# Patient Record
Sex: Female | Born: 1955 | Race: Black or African American | Hispanic: No | Marital: Single | Smoking: Former smoker
Health system: Southern US, Community
[De-identification: ages and names within clinical notes are randomized; demographics above are authoritative.]

## PROBLEM LIST (undated history)

## (undated) DIAGNOSIS — F329 Major depressive disorder, single episode, unspecified: Secondary | ICD-10-CM

## (undated) DIAGNOSIS — I10 Essential (primary) hypertension: Secondary | ICD-10-CM

## (undated) DIAGNOSIS — F32A Depression, unspecified: Secondary | ICD-10-CM

## (undated) DIAGNOSIS — C801 Malignant (primary) neoplasm, unspecified: Secondary | ICD-10-CM

## (undated) DIAGNOSIS — Z21 Asymptomatic human immunodeficiency virus [HIV] infection status: Secondary | ICD-10-CM

## (undated) DIAGNOSIS — B20 Human immunodeficiency virus [HIV] disease: Secondary | ICD-10-CM

## (undated) DIAGNOSIS — G43909 Migraine, unspecified, not intractable, without status migrainosus: Secondary | ICD-10-CM

## (undated) DIAGNOSIS — F209 Schizophrenia, unspecified: Secondary | ICD-10-CM

## (undated) HISTORY — PX: THYROID SURGERY: SHX805

## (undated) HISTORY — DX: Migraine, unspecified, not intractable, without status migrainosus: G43.909

## (undated) HISTORY — PX: ABDOMINAL HYSTERECTOMY: SHX81

## (undated) HISTORY — DX: Schizophrenia, unspecified: F20.9

## (undated) HISTORY — PX: BREAST BIOPSY: SHX20

---

## 1998-06-08 ENCOUNTER — Other Ambulatory Visit: Admission: RE | Admit: 1998-06-08 | Discharge: 1998-06-08 | Payer: Self-pay | Admitting: Family Medicine

## 2000-09-30 ENCOUNTER — Encounter: Payer: Self-pay | Admitting: Family Medicine

## 2000-09-30 ENCOUNTER — Ambulatory Visit (HOSPITAL_COMMUNITY): Admission: RE | Admit: 2000-09-30 | Discharge: 2000-09-30 | Payer: Self-pay | Admitting: Family Medicine

## 2001-04-28 ENCOUNTER — Ambulatory Visit (HOSPITAL_COMMUNITY): Admission: RE | Admit: 2001-04-28 | Discharge: 2001-04-28 | Payer: Self-pay | Admitting: Family Medicine

## 2001-07-24 ENCOUNTER — Emergency Department (HOSPITAL_COMMUNITY): Admission: EM | Admit: 2001-07-24 | Discharge: 2001-07-24 | Payer: Self-pay | Admitting: Emergency Medicine

## 2002-12-31 HISTORY — PX: FINGER SURGERY: SHX640

## 2002-12-31 HISTORY — PX: ANKLE SURGERY: SHX546

## 2003-06-02 ENCOUNTER — Ambulatory Visit (HOSPITAL_BASED_OUTPATIENT_CLINIC_OR_DEPARTMENT_OTHER): Admission: RE | Admit: 2003-06-02 | Discharge: 2003-06-02 | Payer: Self-pay | Admitting: Orthopedic Surgery

## 2003-07-30 ENCOUNTER — Encounter: Payer: Self-pay | Admitting: Occupational Therapy

## 2003-07-30 ENCOUNTER — Ambulatory Visit (HOSPITAL_COMMUNITY): Admission: RE | Admit: 2003-07-30 | Discharge: 2003-07-30 | Payer: Self-pay | Admitting: Family Medicine

## 2003-08-02 ENCOUNTER — Ambulatory Visit (HOSPITAL_COMMUNITY): Admission: RE | Admit: 2003-08-02 | Discharge: 2003-08-02 | Payer: Self-pay | Admitting: Family Medicine

## 2003-08-02 ENCOUNTER — Encounter: Payer: Self-pay | Admitting: Family Medicine

## 2003-08-17 ENCOUNTER — Ambulatory Visit (HOSPITAL_COMMUNITY): Admission: RE | Admit: 2003-08-17 | Discharge: 2003-08-17 | Payer: Self-pay | Admitting: Family Medicine

## 2003-08-17 ENCOUNTER — Encounter: Payer: Self-pay | Admitting: Family Medicine

## 2003-08-17 ENCOUNTER — Encounter (INDEPENDENT_AMBULATORY_CARE_PROVIDER_SITE_OTHER): Payer: Self-pay | Admitting: *Deleted

## 2004-06-22 ENCOUNTER — Ambulatory Visit (HOSPITAL_COMMUNITY): Admission: RE | Admit: 2004-06-22 | Discharge: 2004-06-22 | Payer: Self-pay | Admitting: Hematology and Oncology

## 2004-07-31 ENCOUNTER — Ambulatory Visit (HOSPITAL_COMMUNITY): Admission: RE | Admit: 2004-07-31 | Discharge: 2004-07-31 | Payer: Self-pay | Admitting: Family Medicine

## 2004-09-25 ENCOUNTER — Ambulatory Visit: Payer: Self-pay | Admitting: *Deleted

## 2004-09-25 ENCOUNTER — Ambulatory Visit: Payer: Self-pay | Admitting: Family Medicine

## 2004-10-03 ENCOUNTER — Ambulatory Visit: Payer: Self-pay | Admitting: Family Medicine

## 2004-10-09 ENCOUNTER — Emergency Department (HOSPITAL_COMMUNITY): Admission: EM | Admit: 2004-10-09 | Discharge: 2004-10-09 | Payer: Self-pay | Admitting: Emergency Medicine

## 2004-10-17 ENCOUNTER — Ambulatory Visit: Payer: Self-pay | Admitting: Family Medicine

## 2004-12-20 ENCOUNTER — Ambulatory Visit: Payer: Self-pay | Admitting: Family Medicine

## 2005-03-08 ENCOUNTER — Ambulatory Visit: Payer: Self-pay | Admitting: Family Medicine

## 2005-04-30 ENCOUNTER — Ambulatory Visit: Payer: Self-pay | Admitting: Family Medicine

## 2005-06-05 ENCOUNTER — Ambulatory Visit: Payer: Self-pay | Admitting: Family Medicine

## 2005-08-01 ENCOUNTER — Ambulatory Visit (HOSPITAL_COMMUNITY): Admission: RE | Admit: 2005-08-01 | Discharge: 2005-08-01 | Payer: Self-pay | Admitting: Family Medicine

## 2005-09-26 ENCOUNTER — Ambulatory Visit: Payer: Self-pay | Admitting: Family Medicine

## 2005-11-12 ENCOUNTER — Ambulatory Visit: Payer: Self-pay | Admitting: Family Medicine

## 2006-02-12 ENCOUNTER — Ambulatory Visit: Payer: Self-pay | Admitting: Family Medicine

## 2006-03-25 ENCOUNTER — Ambulatory Visit: Payer: Self-pay | Admitting: Family Medicine

## 2006-06-17 ENCOUNTER — Ambulatory Visit: Payer: Self-pay | Admitting: Family Medicine

## 2006-09-04 ENCOUNTER — Emergency Department (HOSPITAL_COMMUNITY): Admission: EM | Admit: 2006-09-04 | Discharge: 2006-09-04 | Payer: Self-pay | Admitting: Emergency Medicine

## 2006-09-18 ENCOUNTER — Ambulatory Visit: Payer: Self-pay | Admitting: Family Medicine

## 2006-10-25 ENCOUNTER — Emergency Department (HOSPITAL_COMMUNITY): Admission: EM | Admit: 2006-10-25 | Discharge: 2006-10-25 | Payer: Self-pay | Admitting: Emergency Medicine

## 2006-11-07 ENCOUNTER — Ambulatory Visit: Payer: Self-pay | Admitting: Family Medicine

## 2006-11-07 ENCOUNTER — Emergency Department (HOSPITAL_COMMUNITY): Admission: EM | Admit: 2006-11-07 | Discharge: 2006-11-07 | Payer: Self-pay | Admitting: Emergency Medicine

## 2006-11-16 ENCOUNTER — Emergency Department (HOSPITAL_COMMUNITY): Admission: EM | Admit: 2006-11-16 | Discharge: 2006-11-16 | Payer: Self-pay | Admitting: Emergency Medicine

## 2006-11-19 ENCOUNTER — Emergency Department (HOSPITAL_COMMUNITY): Admission: EM | Admit: 2006-11-19 | Discharge: 2006-11-19 | Payer: Self-pay | Admitting: Emergency Medicine

## 2006-11-28 ENCOUNTER — Ambulatory Visit: Payer: Self-pay | Admitting: Family Medicine

## 2006-12-11 ENCOUNTER — Ambulatory Visit: Payer: Self-pay | Admitting: Family Medicine

## 2006-12-30 ENCOUNTER — Ambulatory Visit: Payer: Self-pay | Admitting: Family Medicine

## 2007-01-20 ENCOUNTER — Ambulatory Visit: Payer: Self-pay | Admitting: Family Medicine

## 2007-03-18 ENCOUNTER — Ambulatory Visit (HOSPITAL_COMMUNITY): Admission: RE | Admit: 2007-03-18 | Discharge: 2007-03-18 | Payer: Self-pay | Admitting: Obstetrics and Gynecology

## 2007-04-21 ENCOUNTER — Ambulatory Visit: Payer: Self-pay | Admitting: Family Medicine

## 2007-05-31 ENCOUNTER — Emergency Department (HOSPITAL_COMMUNITY): Admission: EM | Admit: 2007-05-31 | Discharge: 2007-05-31 | Payer: Self-pay | Admitting: Emergency Medicine

## 2007-06-07 ENCOUNTER — Emergency Department (HOSPITAL_COMMUNITY): Admission: EM | Admit: 2007-06-07 | Discharge: 2007-06-07 | Payer: Self-pay | Admitting: Emergency Medicine

## 2007-06-20 ENCOUNTER — Encounter (INDEPENDENT_AMBULATORY_CARE_PROVIDER_SITE_OTHER): Payer: Self-pay | Admitting: Family Medicine

## 2007-06-23 ENCOUNTER — Emergency Department (HOSPITAL_COMMUNITY): Admission: EM | Admit: 2007-06-23 | Discharge: 2007-06-23 | Payer: Self-pay | Admitting: Emergency Medicine

## 2007-06-27 ENCOUNTER — Emergency Department (HOSPITAL_COMMUNITY): Admission: EM | Admit: 2007-06-27 | Discharge: 2007-06-27 | Payer: Self-pay | Admitting: Emergency Medicine

## 2007-07-16 ENCOUNTER — Emergency Department (HOSPITAL_COMMUNITY): Admission: EM | Admit: 2007-07-16 | Discharge: 2007-07-16 | Payer: Self-pay | Admitting: Emergency Medicine

## 2007-08-05 ENCOUNTER — Ambulatory Visit: Payer: Self-pay | Admitting: Family Medicine

## 2007-08-29 DIAGNOSIS — H519 Unspecified disorder of binocular movement: Secondary | ICD-10-CM | POA: Insufficient documentation

## 2007-08-29 DIAGNOSIS — E0590 Thyrotoxicosis, unspecified without thyrotoxic crisis or storm: Secondary | ICD-10-CM | POA: Insufficient documentation

## 2007-08-29 DIAGNOSIS — F329 Major depressive disorder, single episode, unspecified: Secondary | ICD-10-CM

## 2007-08-29 DIAGNOSIS — E042 Nontoxic multinodular goiter: Secondary | ICD-10-CM

## 2007-08-29 DIAGNOSIS — R443 Hallucinations, unspecified: Secondary | ICD-10-CM

## 2007-08-29 DIAGNOSIS — G43909 Migraine, unspecified, not intractable, without status migrainosus: Secondary | ICD-10-CM | POA: Insufficient documentation

## 2007-09-09 ENCOUNTER — Ambulatory Visit: Payer: Self-pay | Admitting: Family Medicine

## 2007-09-17 ENCOUNTER — Encounter (INDEPENDENT_AMBULATORY_CARE_PROVIDER_SITE_OTHER): Payer: Self-pay | Admitting: *Deleted

## 2007-10-09 ENCOUNTER — Encounter (INDEPENDENT_AMBULATORY_CARE_PROVIDER_SITE_OTHER): Payer: Self-pay | Admitting: Family Medicine

## 2007-10-20 ENCOUNTER — Telehealth (INDEPENDENT_AMBULATORY_CARE_PROVIDER_SITE_OTHER): Payer: Self-pay | Admitting: Family Medicine

## 2007-11-20 ENCOUNTER — Telehealth (INDEPENDENT_AMBULATORY_CARE_PROVIDER_SITE_OTHER): Payer: Self-pay | Admitting: Family Medicine

## 2007-12-03 ENCOUNTER — Ambulatory Visit: Payer: Self-pay | Admitting: Family Medicine

## 2007-12-03 ENCOUNTER — Encounter (INDEPENDENT_AMBULATORY_CARE_PROVIDER_SITE_OTHER): Payer: Self-pay | Admitting: Family Medicine

## 2007-12-03 LAB — CONVERTED CEMR LAB
Nitrite: NEGATIVE
Pap Smear: NORMAL
Specific Gravity, Urine: >=1.03
Urobilinogen, UA: NEGATIVE
pH: 6

## 2007-12-11 ENCOUNTER — Ambulatory Visit (HOSPITAL_COMMUNITY): Admission: RE | Admit: 2007-12-11 | Discharge: 2007-12-11 | Payer: Self-pay | Admitting: Ophthalmology

## 2007-12-11 HISTORY — PX: STRABISMUS SURGERY: SHX218

## 2007-12-16 ENCOUNTER — Telehealth (INDEPENDENT_AMBULATORY_CARE_PROVIDER_SITE_OTHER): Payer: Self-pay | Admitting: Family Medicine

## 2007-12-16 LAB — CONVERTED CEMR LAB
ALT: 55 units/L — ABNORMAL HIGH (ref 0–35)
Albumin: 4.2 g/dL (ref 3.5–5.2)
CO2: 23 meq/L (ref 19–32)
Calcium: 9 mg/dL (ref 8.4–10.5)
Cholesterol: 216 mg/dL — ABNORMAL HIGH (ref 0–200)
Creatinine, Ser: 0.66 mg/dL (ref 0.40–1.20)
Eosinophils Absolute: 0.2 10*3/uL (ref 0.2–0.7)
Glucose, Bld: 86 mg/dL (ref 70–99)
HDL: 66 mg/dL (ref >39)
Hemoglobin: 12.7 g/dL (ref 12.0–15.0)
LDL Cholesterol: 134 mg/dL — ABNORMAL HIGH (ref 0–99)
Lymphocytes Relative: 50 % — ABNORMAL HIGH (ref 12–46)
Lymphs Abs: 2.1 10*3/uL (ref 0.7–4.0)
Monocytes Absolute: 0.5 10*3/uL (ref 0.1–1.0)
Neutro Abs: 1.5 10*3/uL — ABNORMAL LOW (ref 1.7–7.7)
RBC: 4.21 M/uL (ref 3.87–5.11)
TSH: 0.281 microintl units/mL — ABNORMAL LOW (ref 0.350–5.50)
Triglycerides: 80 mg/dL (ref <150)
VLDL: 16 mg/dL (ref 0–40)
WBC: 4.3 10*3/uL (ref 4.0–10.5)

## 2007-12-18 ENCOUNTER — Telehealth (INDEPENDENT_AMBULATORY_CARE_PROVIDER_SITE_OTHER): Payer: Self-pay | Admitting: *Deleted

## 2008-01-29 ENCOUNTER — Ambulatory Visit: Payer: Self-pay | Admitting: Family Medicine

## 2008-01-29 DIAGNOSIS — R7989 Other specified abnormal findings of blood chemistry: Secondary | ICD-10-CM | POA: Insufficient documentation

## 2008-01-29 LAB — CONVERTED CEMR LAB
ALT: 24 units/L (ref 0–35)
Albumin: 4.3 g/dL (ref 3.5–5.2)

## 2008-02-23 ENCOUNTER — Ambulatory Visit: Payer: Self-pay | Admitting: Family Medicine

## 2008-03-15 ENCOUNTER — Ambulatory Visit (HOSPITAL_COMMUNITY): Admission: RE | Admit: 2008-03-15 | Discharge: 2008-03-15 | Payer: Self-pay | Admitting: Family Medicine

## 2008-03-23 ENCOUNTER — Ambulatory Visit: Payer: Self-pay | Admitting: Family Medicine

## 2008-03-23 DIAGNOSIS — IMO0002 Reserved for concepts with insufficient information to code with codable children: Secondary | ICD-10-CM

## 2008-03-23 DIAGNOSIS — M1710 Unilateral primary osteoarthritis, unspecified knee: Secondary | ICD-10-CM | POA: Insufficient documentation

## 2008-03-24 ENCOUNTER — Ambulatory Visit (HOSPITAL_COMMUNITY): Admission: RE | Admit: 2008-03-24 | Discharge: 2008-03-24 | Payer: Self-pay | Admitting: Family Medicine

## 2008-03-31 ENCOUNTER — Ambulatory Visit: Payer: Self-pay | Admitting: Family Medicine

## 2008-03-31 DIAGNOSIS — N760 Acute vaginitis: Secondary | ICD-10-CM | POA: Insufficient documentation

## 2008-05-04 ENCOUNTER — Telehealth (INDEPENDENT_AMBULATORY_CARE_PROVIDER_SITE_OTHER): Payer: Self-pay | Admitting: *Deleted

## 2008-05-05 ENCOUNTER — Telehealth (INDEPENDENT_AMBULATORY_CARE_PROVIDER_SITE_OTHER): Payer: Self-pay | Admitting: *Deleted

## 2008-06-02 ENCOUNTER — Ambulatory Visit: Payer: Self-pay | Admitting: Nurse Practitioner

## 2008-06-02 DIAGNOSIS — H9209 Otalgia, unspecified ear: Secondary | ICD-10-CM | POA: Insufficient documentation

## 2008-06-08 ENCOUNTER — Ambulatory Visit: Payer: Self-pay | Admitting: Family Medicine

## 2008-06-08 DIAGNOSIS — L299 Pruritus, unspecified: Secondary | ICD-10-CM | POA: Insufficient documentation

## 2008-06-29 ENCOUNTER — Telehealth (INDEPENDENT_AMBULATORY_CARE_PROVIDER_SITE_OTHER): Payer: Self-pay | Admitting: Family Medicine

## 2008-07-12 ENCOUNTER — Telehealth (INDEPENDENT_AMBULATORY_CARE_PROVIDER_SITE_OTHER): Payer: Self-pay | Admitting: Family Medicine

## 2008-08-11 ENCOUNTER — Ambulatory Visit: Payer: Self-pay | Admitting: Family Medicine

## 2008-08-11 LAB — CONVERTED CEMR LAB: FSH: 29.9 milliintl units/mL

## 2008-08-13 ENCOUNTER — Ambulatory Visit (HOSPITAL_COMMUNITY): Admission: RE | Admit: 2008-08-13 | Discharge: 2008-08-13 | Payer: Self-pay | Admitting: Family Medicine

## 2008-08-16 ENCOUNTER — Encounter (INDEPENDENT_AMBULATORY_CARE_PROVIDER_SITE_OTHER): Payer: Self-pay | Admitting: Family Medicine

## 2008-09-14 ENCOUNTER — Telehealth (INDEPENDENT_AMBULATORY_CARE_PROVIDER_SITE_OTHER): Payer: Self-pay | Admitting: Family Medicine

## 2008-09-16 ENCOUNTER — Telehealth (INDEPENDENT_AMBULATORY_CARE_PROVIDER_SITE_OTHER): Payer: Self-pay | Admitting: Family Medicine

## 2008-09-23 ENCOUNTER — Encounter (INDEPENDENT_AMBULATORY_CARE_PROVIDER_SITE_OTHER): Payer: Self-pay | Admitting: Family Medicine

## 2008-10-05 ENCOUNTER — Ambulatory Visit: Payer: Self-pay | Admitting: Family Medicine

## 2008-10-05 DIAGNOSIS — L600 Ingrowing nail: Secondary | ICD-10-CM

## 2008-10-15 ENCOUNTER — Encounter (INDEPENDENT_AMBULATORY_CARE_PROVIDER_SITE_OTHER): Payer: Self-pay | Admitting: Nurse Practitioner

## 2008-11-22 ENCOUNTER — Telehealth (INDEPENDENT_AMBULATORY_CARE_PROVIDER_SITE_OTHER): Payer: Self-pay | Admitting: Family Medicine

## 2008-12-06 ENCOUNTER — Telehealth (INDEPENDENT_AMBULATORY_CARE_PROVIDER_SITE_OTHER): Payer: Self-pay | Admitting: Family Medicine

## 2009-01-10 ENCOUNTER — Ambulatory Visit: Payer: Self-pay | Admitting: Family Medicine

## 2009-02-28 ENCOUNTER — Telehealth (INDEPENDENT_AMBULATORY_CARE_PROVIDER_SITE_OTHER): Payer: Self-pay | Admitting: Family Medicine

## 2009-04-07 ENCOUNTER — Ambulatory Visit (HOSPITAL_COMMUNITY): Admission: RE | Admit: 2009-04-07 | Discharge: 2009-04-07 | Payer: Self-pay | Admitting: Family Medicine

## 2009-05-04 ENCOUNTER — Ambulatory Visit: Payer: Self-pay | Admitting: Family Medicine

## 2009-06-17 ENCOUNTER — Telehealth (INDEPENDENT_AMBULATORY_CARE_PROVIDER_SITE_OTHER): Payer: Self-pay | Admitting: Family Medicine

## 2009-06-27 ENCOUNTER — Encounter (INDEPENDENT_AMBULATORY_CARE_PROVIDER_SITE_OTHER): Payer: Self-pay | Admitting: *Deleted

## 2009-07-11 ENCOUNTER — Ambulatory Visit: Payer: Self-pay | Admitting: Nurse Practitioner

## 2009-07-11 DIAGNOSIS — B0229 Other postherpetic nervous system involvement: Secondary | ICD-10-CM

## 2009-07-21 ENCOUNTER — Emergency Department (HOSPITAL_COMMUNITY): Admission: EM | Admit: 2009-07-21 | Discharge: 2009-07-21 | Payer: Self-pay | Admitting: Emergency Medicine

## 2009-07-28 ENCOUNTER — Emergency Department (HOSPITAL_COMMUNITY): Admission: EM | Admit: 2009-07-28 | Discharge: 2009-07-28 | Payer: Self-pay | Admitting: Emergency Medicine

## 2009-07-28 ENCOUNTER — Encounter (INDEPENDENT_AMBULATORY_CARE_PROVIDER_SITE_OTHER): Payer: Self-pay | Admitting: Nurse Practitioner

## 2009-08-04 ENCOUNTER — Ambulatory Visit: Payer: Self-pay | Admitting: Nurse Practitioner

## 2009-08-06 ENCOUNTER — Emergency Department (HOSPITAL_COMMUNITY): Admission: EM | Admit: 2009-08-06 | Discharge: 2009-08-06 | Payer: Self-pay | Admitting: Emergency Medicine

## 2009-08-06 ENCOUNTER — Encounter (INDEPENDENT_AMBULATORY_CARE_PROVIDER_SITE_OTHER): Payer: Self-pay | Admitting: Nurse Practitioner

## 2009-08-10 ENCOUNTER — Ambulatory Visit: Payer: Self-pay | Admitting: Physician Assistant

## 2009-08-10 DIAGNOSIS — N390 Urinary tract infection, site not specified: Secondary | ICD-10-CM | POA: Insufficient documentation

## 2009-08-10 DIAGNOSIS — R079 Chest pain, unspecified: Secondary | ICD-10-CM | POA: Insufficient documentation

## 2009-08-10 LAB — CONVERTED CEMR LAB: TSH: 0.331 microintl units/mL — ABNORMAL LOW (ref 0.350–4.500)

## 2009-08-11 ENCOUNTER — Emergency Department (HOSPITAL_COMMUNITY): Admission: EM | Admit: 2009-08-11 | Discharge: 2009-08-11 | Payer: Self-pay | Admitting: Emergency Medicine

## 2009-08-12 ENCOUNTER — Ambulatory Visit: Payer: Self-pay | Admitting: Nurse Practitioner

## 2009-08-12 LAB — CONVERTED CEMR LAB
Blood in Urine, dipstick: NEGATIVE
Glucose, Urine, Semiquant: NEGATIVE
Ketones, urine, test strip: NEGATIVE
Specific Gravity, Urine: >=1.03
WBC Urine, dipstick: NEGATIVE

## 2009-08-13 ENCOUNTER — Encounter: Payer: Self-pay | Admitting: Physician Assistant

## 2009-08-15 ENCOUNTER — Telehealth: Payer: Self-pay | Admitting: Physician Assistant

## 2009-08-26 ENCOUNTER — Emergency Department (HOSPITAL_COMMUNITY): Admission: EM | Admit: 2009-08-26 | Discharge: 2009-08-26 | Payer: Self-pay | Admitting: Emergency Medicine

## 2009-08-30 ENCOUNTER — Emergency Department (HOSPITAL_COMMUNITY): Admission: EM | Admit: 2009-08-30 | Discharge: 2009-08-30 | Payer: Self-pay | Admitting: Emergency Medicine

## 2009-09-08 ENCOUNTER — Emergency Department (HOSPITAL_COMMUNITY): Admission: EM | Admit: 2009-09-08 | Discharge: 2009-09-08 | Payer: Self-pay | Admitting: Emergency Medicine

## 2009-09-12 ENCOUNTER — Ambulatory Visit: Payer: Self-pay | Admitting: Nurse Practitioner

## 2009-09-12 DIAGNOSIS — M5412 Radiculopathy, cervical region: Secondary | ICD-10-CM | POA: Insufficient documentation

## 2009-09-15 ENCOUNTER — Encounter (INDEPENDENT_AMBULATORY_CARE_PROVIDER_SITE_OTHER): Payer: Self-pay | Admitting: Nurse Practitioner

## 2009-09-19 ENCOUNTER — Ambulatory Visit (HOSPITAL_COMMUNITY): Admission: RE | Admit: 2009-09-19 | Discharge: 2009-09-19 | Payer: Self-pay | Admitting: Internal Medicine

## 2009-09-23 ENCOUNTER — Inpatient Hospital Stay (HOSPITAL_COMMUNITY): Admission: EM | Admit: 2009-09-23 | Discharge: 2009-09-25 | Payer: Self-pay | Admitting: Emergency Medicine

## 2009-09-23 DIAGNOSIS — J189 Pneumonia, unspecified organism: Secondary | ICD-10-CM

## 2009-09-23 LAB — CONVERTED CEMR LAB
AST: 32 units/L
Albumin: 3.2 g/dL
Alkaline Phosphatase: 110 units/L
BUN: 17 mg/dL
Calcium: 8.5 mg/dL
Hemoglobin: 12.8 g/dL
MCHC: 33.5 g/dL
Potassium: 3.7 meq/L
RDW: 13.3 %
Sodium: 135 meq/L
Total Bilirubin: 0.9 mg/dL

## 2009-10-07 ENCOUNTER — Ambulatory Visit: Payer: Self-pay | Admitting: Nurse Practitioner

## 2009-10-07 DIAGNOSIS — E041 Nontoxic single thyroid nodule: Secondary | ICD-10-CM | POA: Insufficient documentation

## 2009-10-10 ENCOUNTER — Encounter: Payer: Self-pay | Admitting: Physician Assistant

## 2009-10-13 ENCOUNTER — Ambulatory Visit: Payer: Self-pay | Admitting: Nurse Practitioner

## 2009-10-13 LAB — CONVERTED CEMR LAB
HCT: 40.6 % (ref 36.0–46.0)
Lymphocytes Relative: 51 % — ABNORMAL HIGH (ref 12–46)
Lymphs Abs: 2.9 10*3/uL (ref 0.7–4.0)
Monocytes Relative: 11 % (ref 3–12)
Neutrophils Relative %: 31 % — ABNORMAL LOW (ref 43–77)
Platelets: 345 10*3/uL (ref 150–400)
RBC: 4.24 M/uL (ref 3.87–5.11)
WBC: 5.7 10*3/uL (ref 4.0–10.5)

## 2009-10-13 IMAGING — US US SOFT TISSUE HEAD/NECK
1 series · 14 of 25 positions shown · non-contrast
Comparison: None

CLINICAL DATA: Hyperthyroidism

THYROID ULTRASOUND
TECHNIQUE: Ultrasound examination of the thyroid gland and
adjacent soft tissues was performed.

[Series 1: us soft tissue head/neck · 0.07mm/px · 14 of 45 slices shown]
[im 1/45]
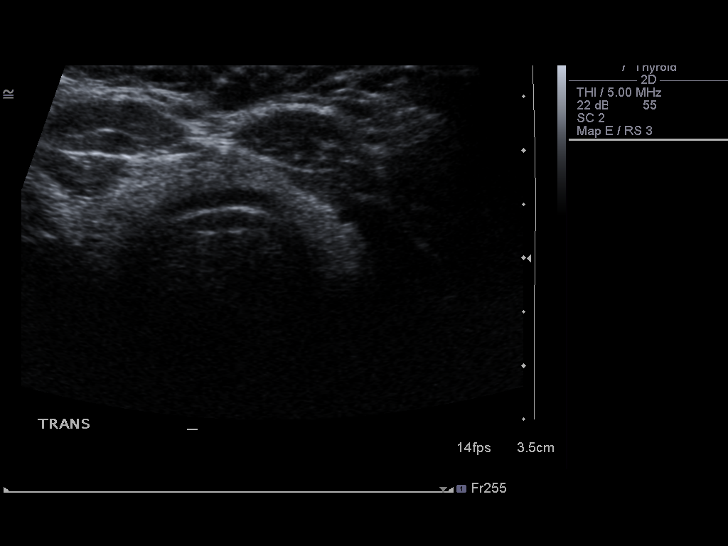
[im 4/45]
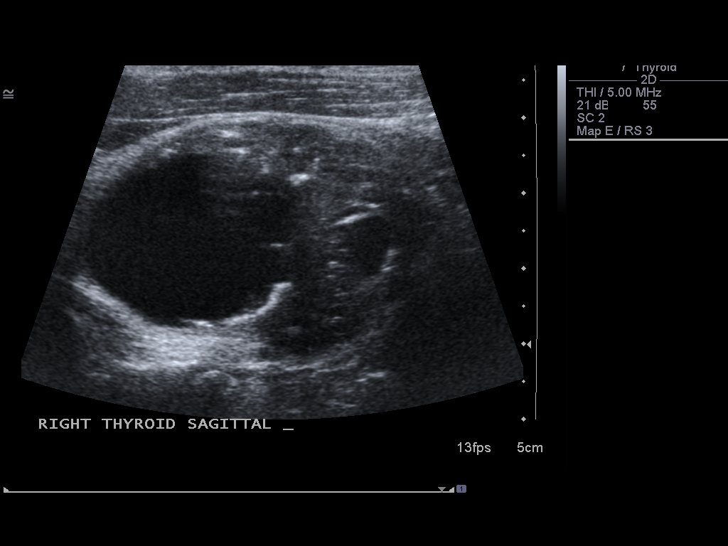
[im 8/45]
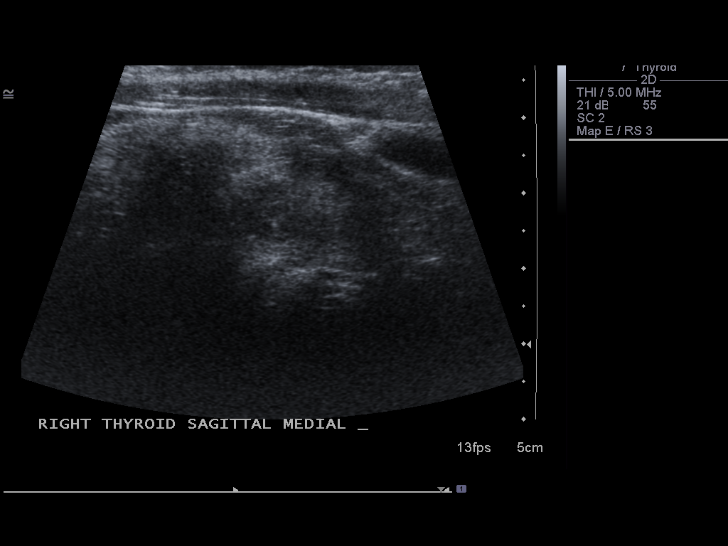
[im 12/45]
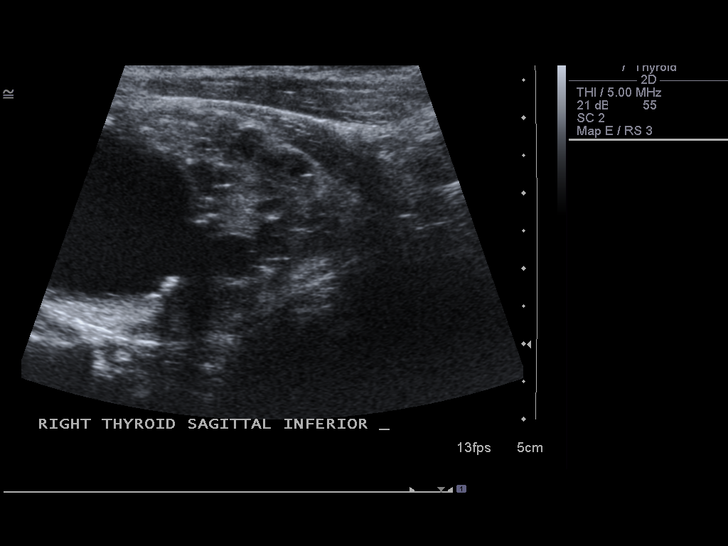
[im 15/45]
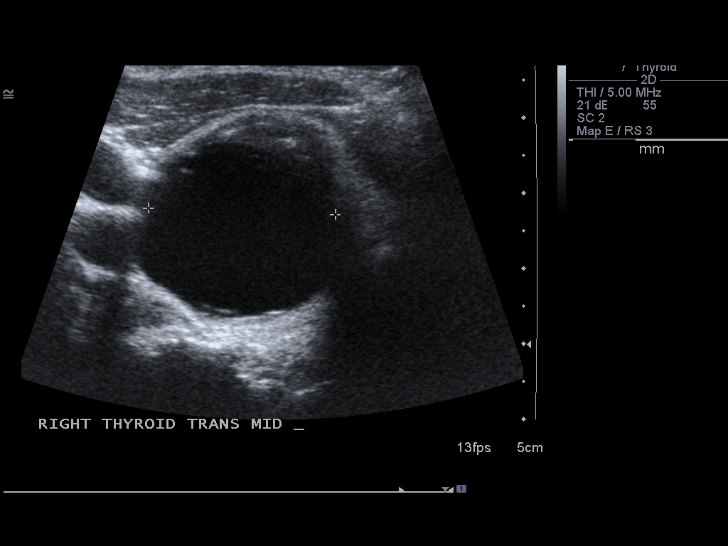
[im 17/45]
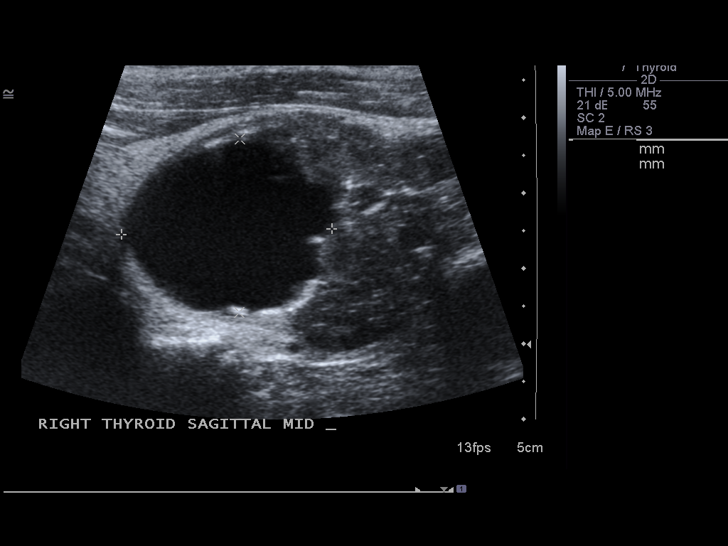
[im 21/45]
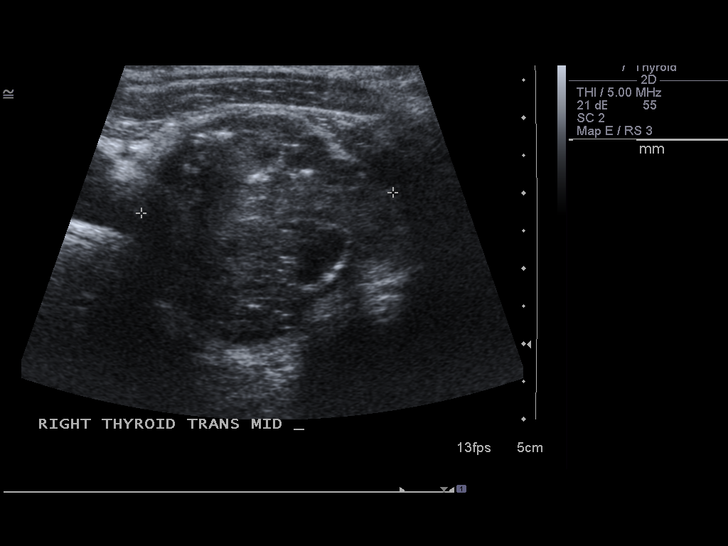
[im 24/45]
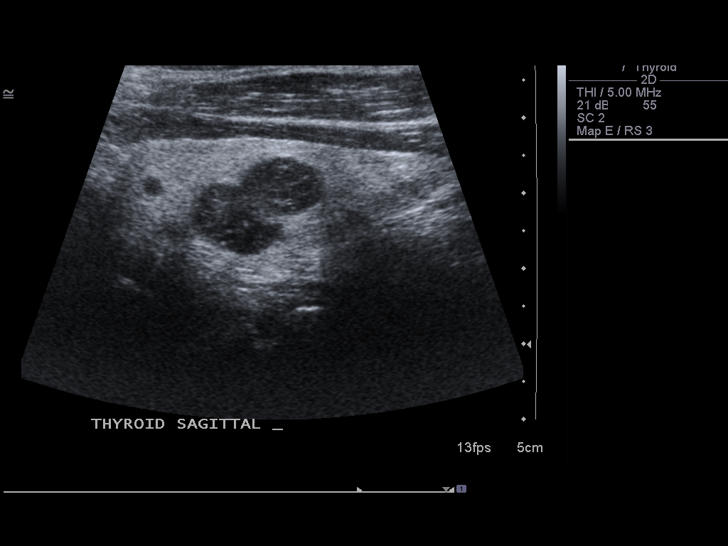
[im 28/45]
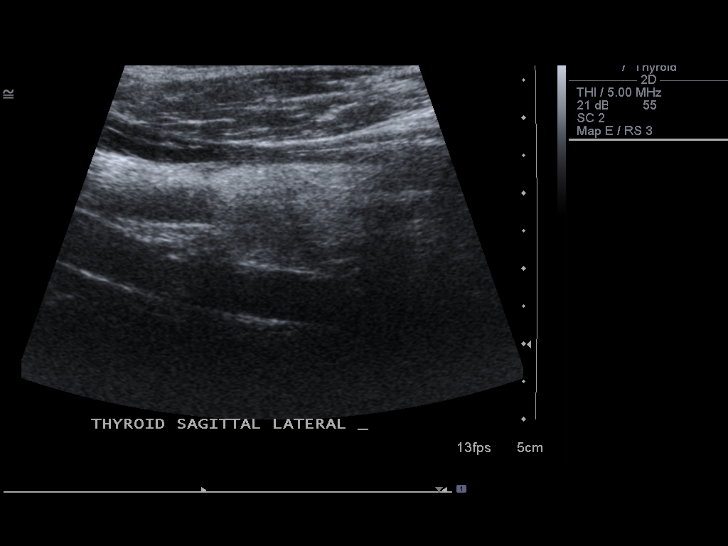
[im 30/45]
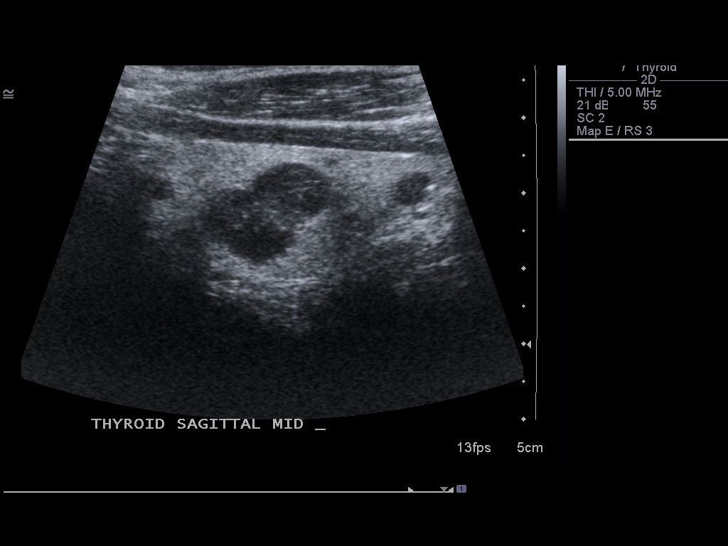
[im 34/45]
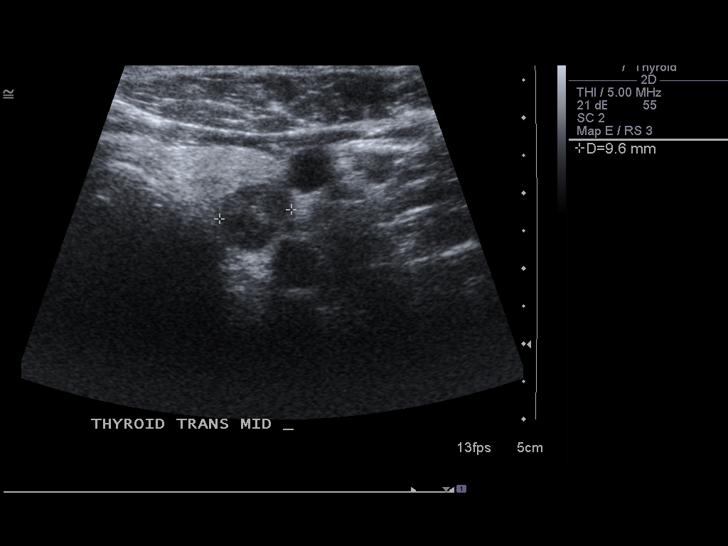
[im 37/45]
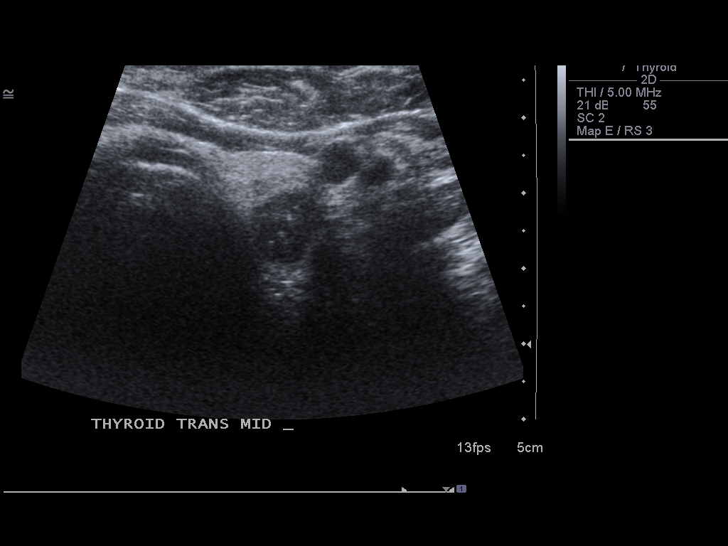
[im 41/45]
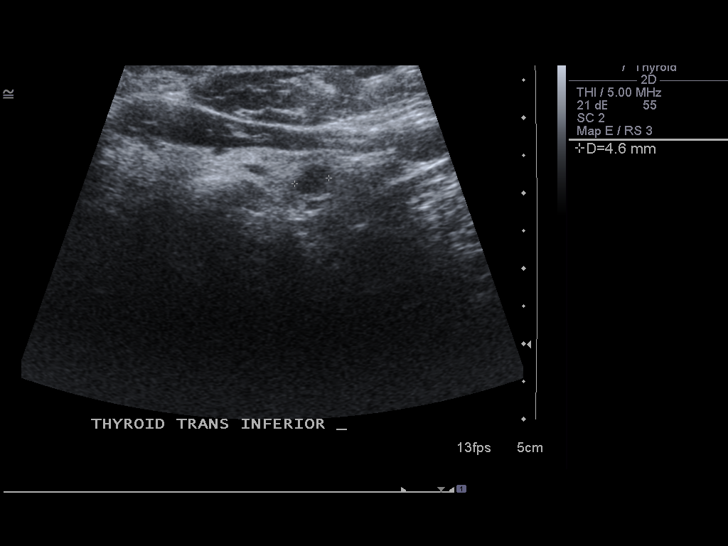
[im 45/45]
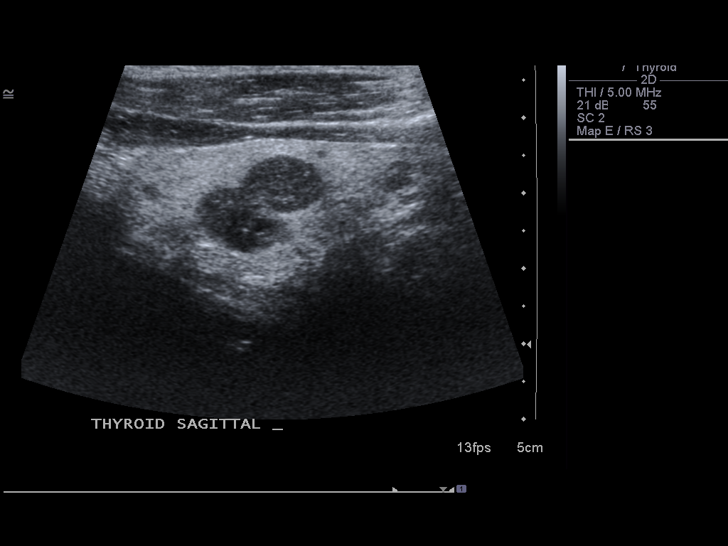

[14 of 25 positions shown; findings below may reference images not displayed]

FINDINGS: The isthmus is normal at 3 mm in thickness.

The left lobe measures and total 4.8 x 2.0 x 1.2 cm.  There are
several areas of heterogeneity within the left lobe.  In the
midportion, there are two adjacent nodules measuring and total 1 x
1 x 1.7 cm.  In the lower pole there is a 5 x 5 x 6 mm nodular
area.

The right lobe measures and total 5.3 x 3.4 x 3.4 cm.  There is a
dominant lesion measuring 5 x 3.2 x 3.4 cm including a 2.8 x 2.3 x
2.5 cm cystic region.

Likely diagnosis in this case is multinodular goiter.  However,
because of the dominant lesion on the right, fine-needle aspiration
of this would be suggested.
IMPRESSION: Small nodules within the left side of the thyroid gland.

The large nodule within the right lobe with an area of internal
cystic change / necrosis.  Fine-needle aspiration of this would be
suggested.

## 2009-10-17 IMAGING — CR DG CHEST 2V
2 series · 2 of 2 positions shown · non-contrast
Comparison: 08/06/2009

CLINICAL DATA: Fever, cough

CHEST - 2 VIEW

[w chest pa]
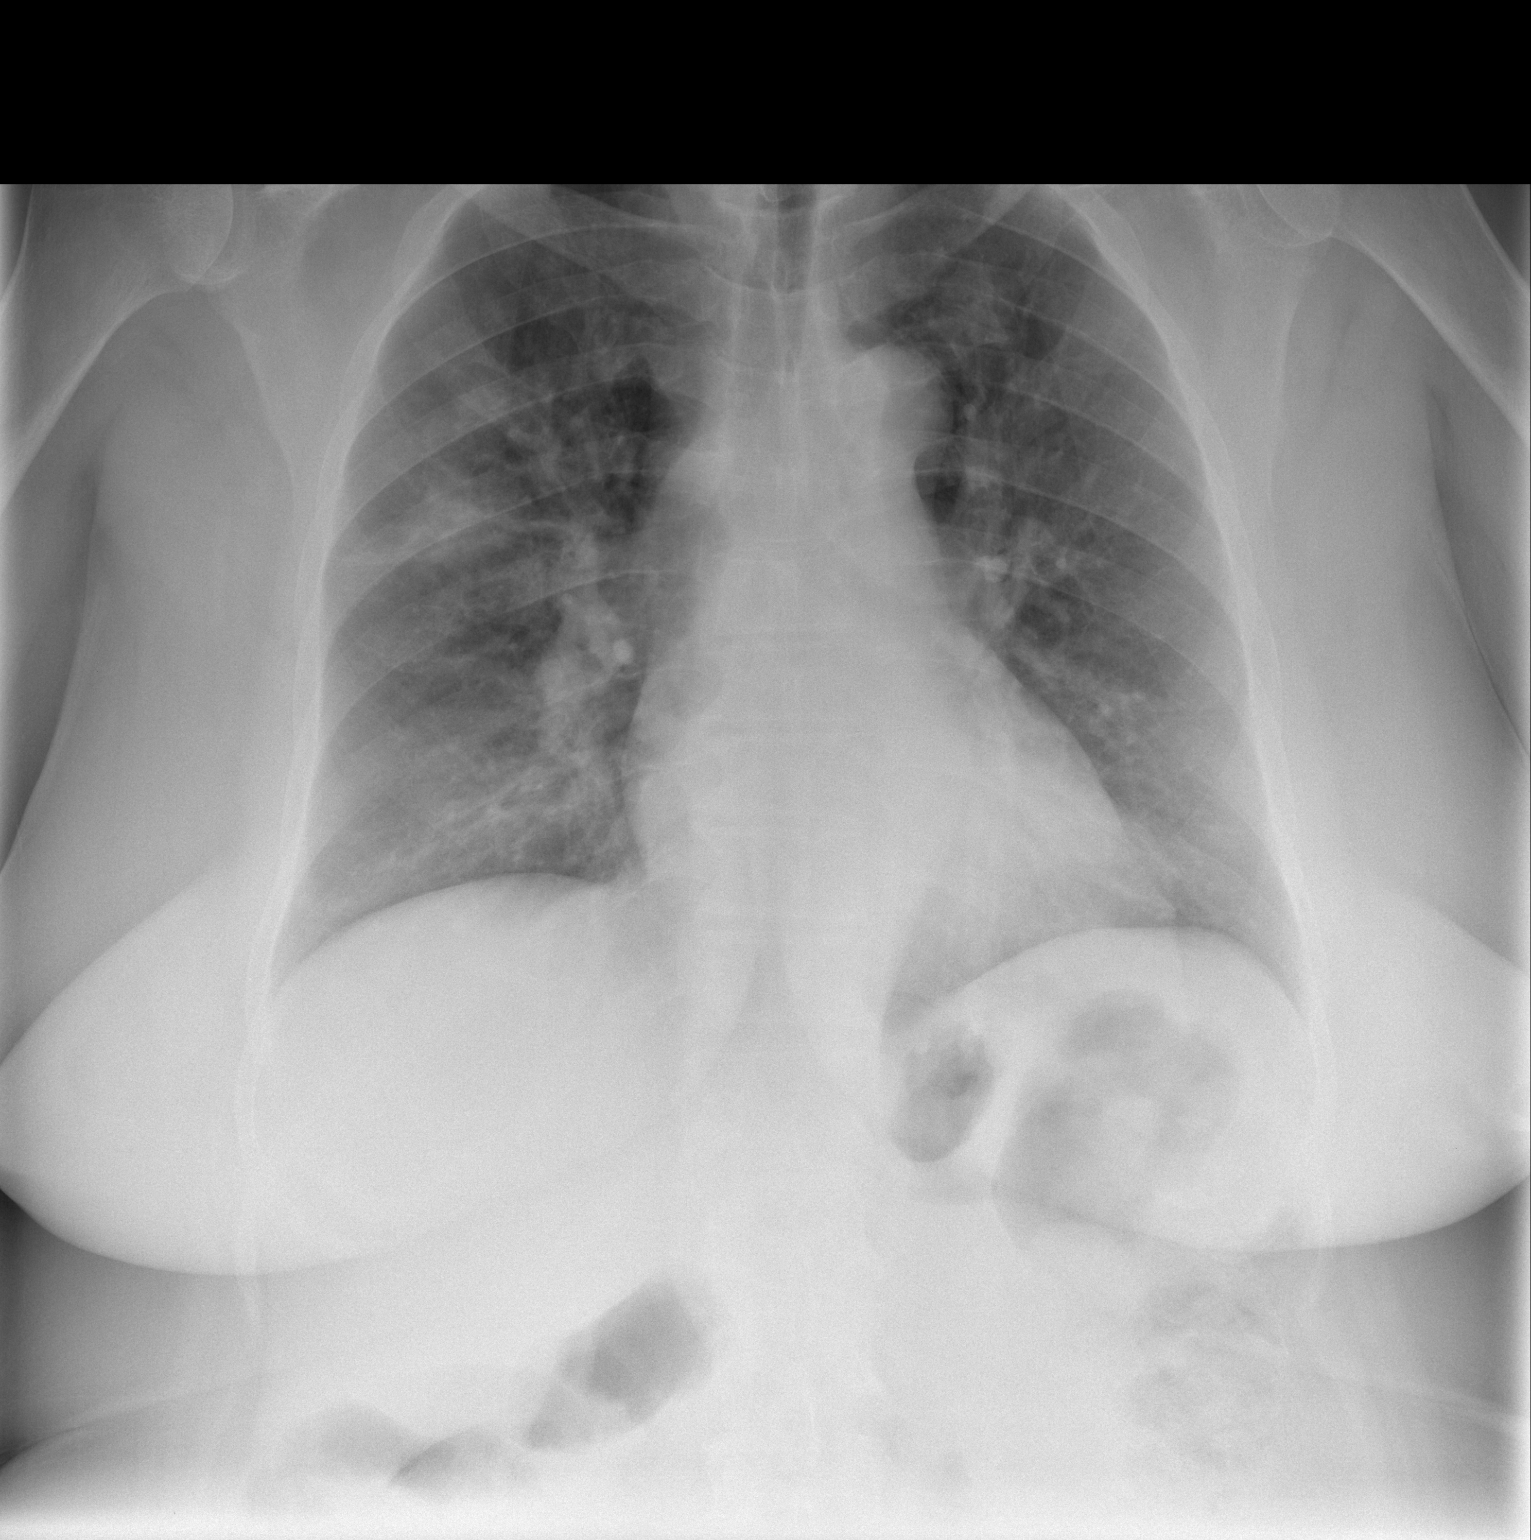

[w chest lat]
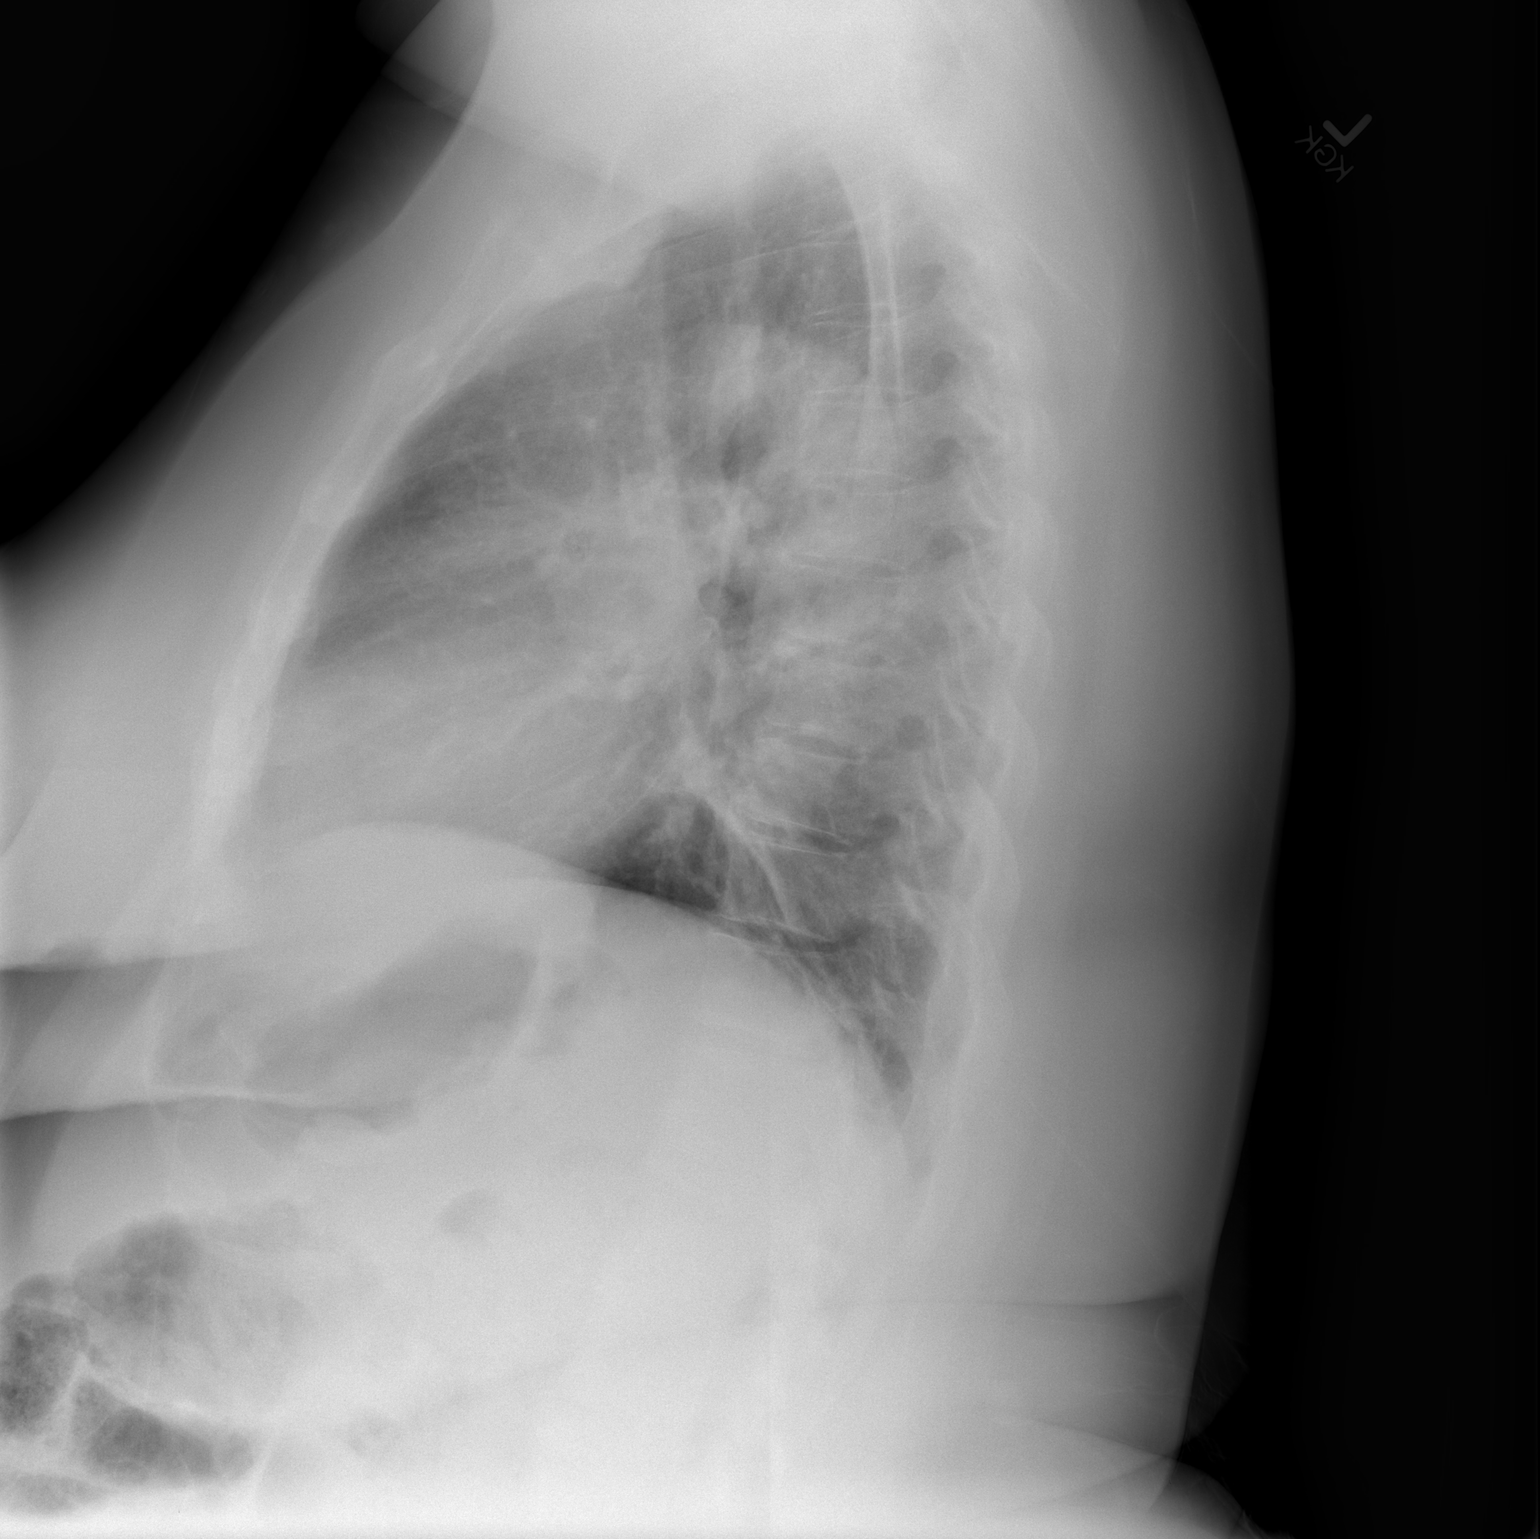

[2 of 2 positions shown; findings below may reference images not displayed]

FINDINGS: Heart size is normal.  Aorta is ectatic and unfolded.
New right upper lobe patchy airspace opacity is noted with
diffusely prominent interstitial markings noted elsewhere.  No
pleural effusion.  Lung volumes are lower with probable minimal
bibasilar atelectasis.  No acute osseous finding.
IMPRESSION: New right upper lobe opacity which, given the clinical
presentation, is concerning for pneumonia.  Given the lower lung
volumes, atelectasis would be possible but less likely.

## 2009-11-11 ENCOUNTER — Emergency Department (HOSPITAL_COMMUNITY): Admission: EM | Admit: 2009-11-11 | Discharge: 2009-11-11 | Payer: Self-pay | Admitting: Emergency Medicine

## 2009-12-02 ENCOUNTER — Telehealth (INDEPENDENT_AMBULATORY_CARE_PROVIDER_SITE_OTHER): Payer: Self-pay | Admitting: Nurse Practitioner

## 2009-12-14 ENCOUNTER — Ambulatory Visit: Payer: Self-pay | Admitting: Nurse Practitioner

## 2009-12-16 ENCOUNTER — Telehealth (INDEPENDENT_AMBULATORY_CARE_PROVIDER_SITE_OTHER): Payer: Self-pay | Admitting: Nurse Practitioner

## 2010-01-16 ENCOUNTER — Ambulatory Visit: Payer: Self-pay | Admitting: Nurse Practitioner

## 2010-01-17 ENCOUNTER — Telehealth (INDEPENDENT_AMBULATORY_CARE_PROVIDER_SITE_OTHER): Payer: Self-pay | Admitting: *Deleted

## 2010-01-24 ENCOUNTER — Encounter: Admission: RE | Admit: 2010-01-24 | Discharge: 2010-01-24 | Payer: Self-pay | Admitting: Internal Medicine

## 2010-01-24 ENCOUNTER — Other Ambulatory Visit: Admission: RE | Admit: 2010-01-24 | Discharge: 2010-01-24 | Payer: Self-pay | Admitting: Interventional Radiology

## 2010-02-02 ENCOUNTER — Telehealth (INDEPENDENT_AMBULATORY_CARE_PROVIDER_SITE_OTHER): Payer: Self-pay | Admitting: Nurse Practitioner

## 2010-02-24 ENCOUNTER — Encounter (INDEPENDENT_AMBULATORY_CARE_PROVIDER_SITE_OTHER): Payer: Self-pay | Admitting: Nurse Practitioner

## 2010-03-02 ENCOUNTER — Emergency Department (HOSPITAL_COMMUNITY): Admission: EM | Admit: 2010-03-02 | Discharge: 2010-03-02 | Payer: Self-pay | Admitting: Emergency Medicine

## 2010-03-05 ENCOUNTER — Emergency Department (HOSPITAL_COMMUNITY): Admission: EM | Admit: 2010-03-05 | Discharge: 2010-03-05 | Payer: Self-pay | Admitting: Emergency Medicine

## 2010-03-05 LAB — CONVERTED CEMR LAB
Hemoglobin: 12.8 g/dL
RBC: 4.06 M/uL

## 2010-03-09 ENCOUNTER — Ambulatory Visit: Payer: Self-pay | Admitting: Nurse Practitioner

## 2010-03-12 LAB — CONVERTED CEMR LAB
BUN: 10 mg/dL
CO2: 27 meq/L
Chloride: 103 meq/L
Glucose, Bld: 108 mg/dL
Potassium: 3.8 meq/L
Total Protein: 8.1 g/dL

## 2010-03-17 ENCOUNTER — Ambulatory Visit: Payer: Self-pay | Admitting: Nurse Practitioner

## 2010-03-17 LAB — CONVERTED CEMR LAB
Free T4: 0.98 ng/dL (ref 0.80–1.80)
T3, Free: 2.8 pg/mL (ref 2.3–4.2)
TSH: 0.08 microintl units/mL — ABNORMAL LOW (ref 0.350–4.500)

## 2010-03-20 ENCOUNTER — Telehealth (INDEPENDENT_AMBULATORY_CARE_PROVIDER_SITE_OTHER): Payer: Self-pay | Admitting: Nurse Practitioner

## 2010-03-20 ENCOUNTER — Encounter (INDEPENDENT_AMBULATORY_CARE_PROVIDER_SITE_OTHER): Payer: Self-pay | Admitting: Nurse Practitioner

## 2010-03-29 IMAGING — CR DG CHEST 2V
2 series · 2 of 2 positions shown · non-contrast
Comparison: 03/02/2010

CLINICAL DATA: Wheezing, hypertension, right upper quadrant
abdominal pain

CHEST - 2 VIEW

[w chest lat]
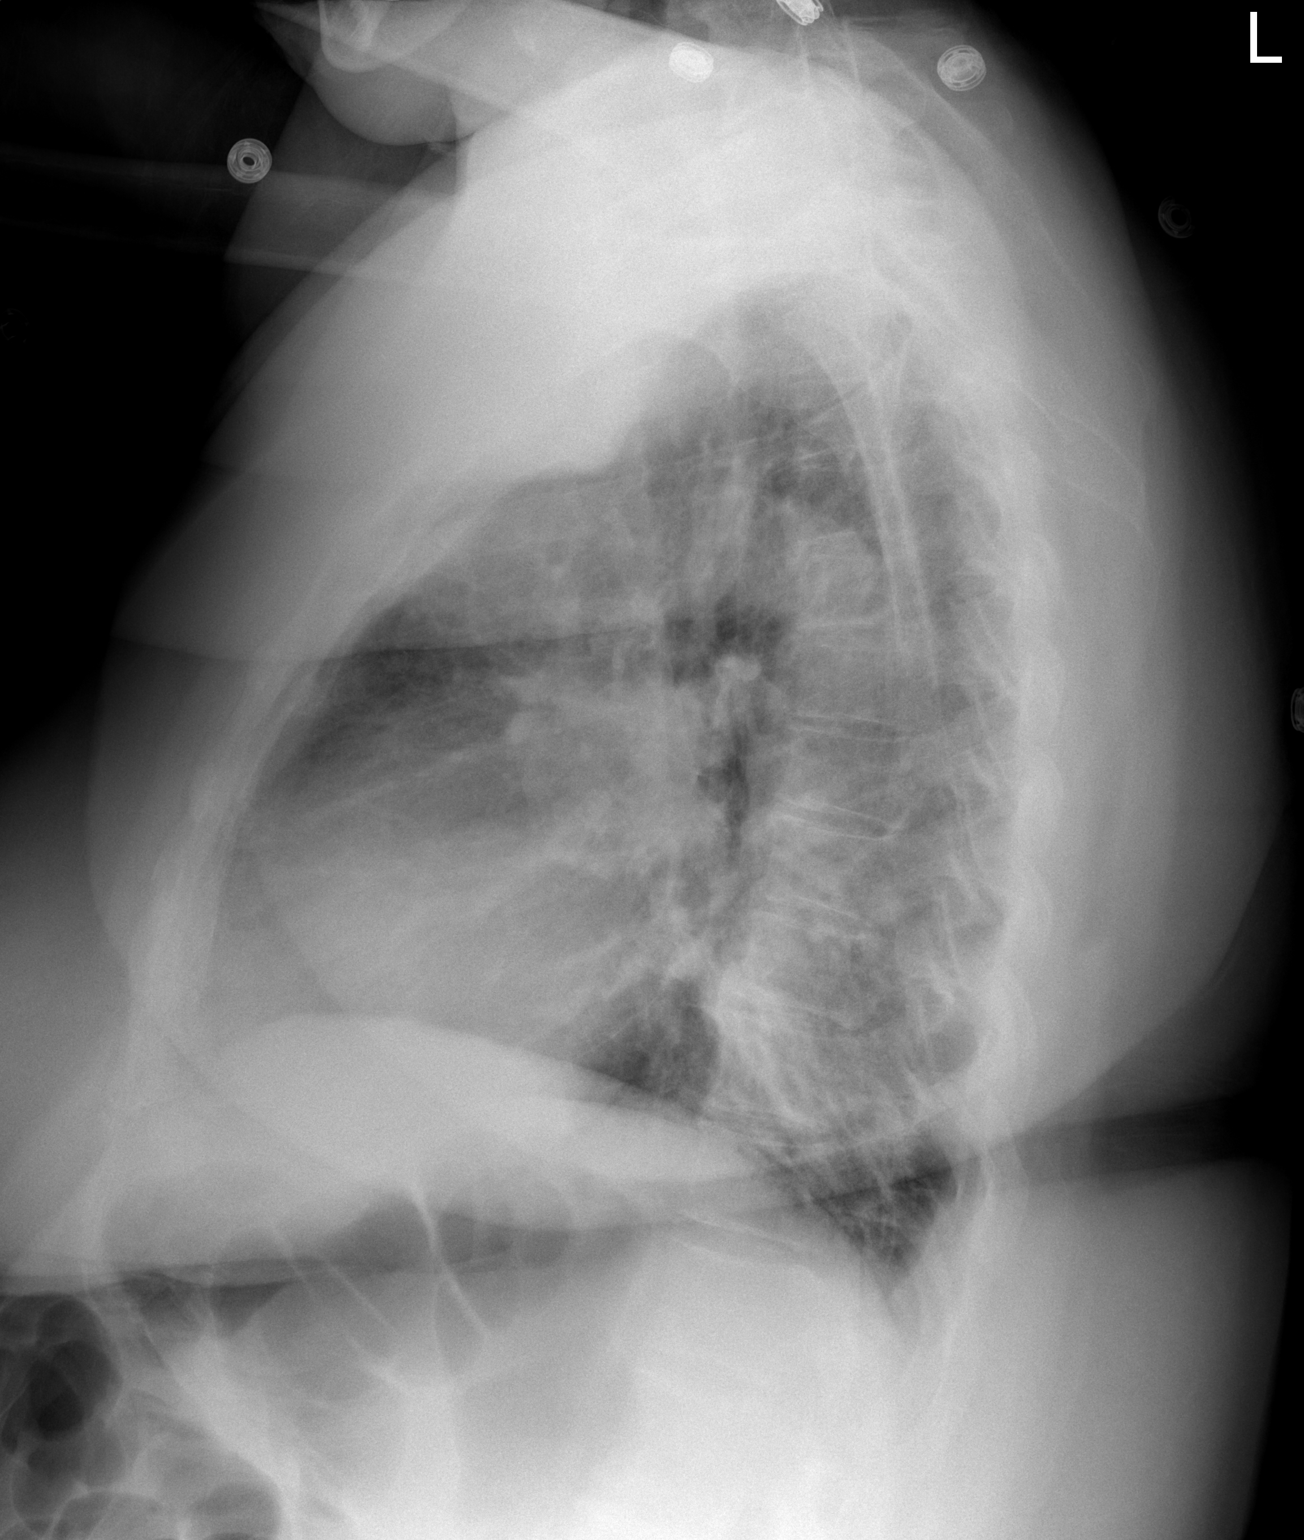

[w chest pa *]
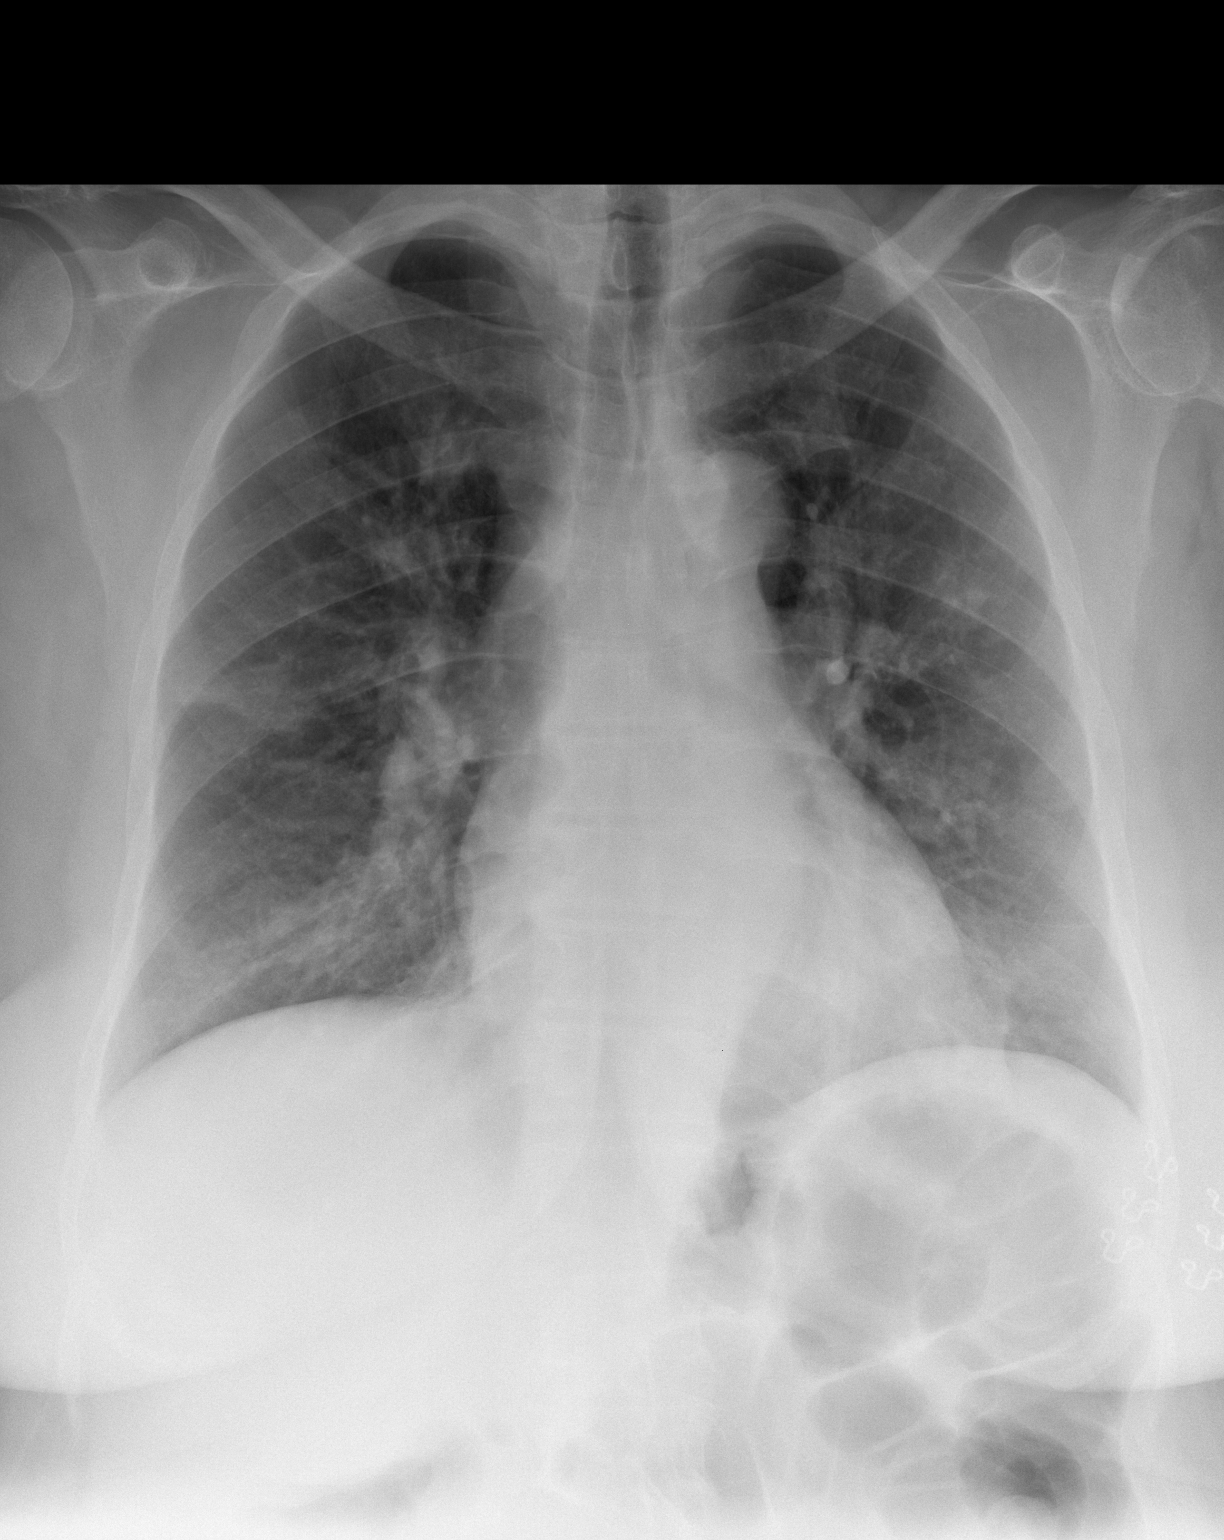

[2 of 2 positions shown; findings below may reference images not displayed]

FINDINGS: Patchy right lower lobe airspace opacity is noted, new
since the prior study.  Heart size is at the upper limits of
normal.  Aorta is unfolded and slightly ectatic.  Right mid lung
zone probable atelectasis noted.  No pleural effusion.  No bony
abnormality.
IMPRESSION: New ill-defined right lower lobe airspace disease which could
represent pneumonia given the history of right upper quadrant
abdominal pain.

## 2010-04-05 ENCOUNTER — Ambulatory Visit: Payer: Self-pay | Admitting: Nurse Practitioner

## 2010-04-05 DIAGNOSIS — B379 Candidiasis, unspecified: Secondary | ICD-10-CM | POA: Insufficient documentation

## 2010-04-07 ENCOUNTER — Encounter (INDEPENDENT_AMBULATORY_CARE_PROVIDER_SITE_OTHER): Payer: Self-pay | Admitting: Nurse Practitioner

## 2010-05-02 ENCOUNTER — Ambulatory Visit: Payer: Self-pay | Admitting: Nurse Practitioner

## 2010-05-02 DIAGNOSIS — J309 Allergic rhinitis, unspecified: Secondary | ICD-10-CM | POA: Insufficient documentation

## 2010-05-02 LAB — CONVERTED CEMR LAB
Bilirubin Urine: NEGATIVE
Glucose, Urine, Semiquant: NEGATIVE
Specific Gravity, Urine: >=1.03
Urobilinogen, UA: 0.2
pH: 6

## 2010-05-03 ENCOUNTER — Encounter (INDEPENDENT_AMBULATORY_CARE_PROVIDER_SITE_OTHER): Payer: Self-pay | Admitting: Nurse Practitioner

## 2010-06-02 ENCOUNTER — Telehealth (INDEPENDENT_AMBULATORY_CARE_PROVIDER_SITE_OTHER): Payer: Self-pay | Admitting: Nurse Practitioner

## 2010-06-02 ENCOUNTER — Ambulatory Visit: Payer: Self-pay | Admitting: Nurse Practitioner

## 2010-06-05 ENCOUNTER — Ambulatory Visit (HOSPITAL_COMMUNITY): Admission: RE | Admit: 2010-06-05 | Discharge: 2010-06-05 | Payer: Self-pay | Admitting: Nurse Practitioner

## 2010-06-09 ENCOUNTER — Encounter (INDEPENDENT_AMBULATORY_CARE_PROVIDER_SITE_OTHER): Payer: Self-pay | Admitting: Nurse Practitioner

## 2010-06-09 ENCOUNTER — Telehealth (INDEPENDENT_AMBULATORY_CARE_PROVIDER_SITE_OTHER): Payer: Self-pay | Admitting: Nurse Practitioner

## 2010-06-09 LAB — CONVERTED CEMR LAB: HDL goal, serum: 40 mg/dL

## 2010-06-21 ENCOUNTER — Ambulatory Visit: Payer: Self-pay | Admitting: Nurse Practitioner

## 2010-07-04 ENCOUNTER — Encounter (INDEPENDENT_AMBULATORY_CARE_PROVIDER_SITE_OTHER): Payer: Self-pay | Admitting: Nurse Practitioner

## 2010-07-12 ENCOUNTER — Encounter (HOSPITAL_COMMUNITY): Admission: RE | Admit: 2010-07-12 | Discharge: 2010-09-06 | Payer: Self-pay | Admitting: Internal Medicine

## 2010-08-01 ENCOUNTER — Telehealth (INDEPENDENT_AMBULATORY_CARE_PROVIDER_SITE_OTHER): Payer: Self-pay | Admitting: Nurse Practitioner

## 2010-09-05 ENCOUNTER — Telehealth (INDEPENDENT_AMBULATORY_CARE_PROVIDER_SITE_OTHER): Payer: Self-pay | Admitting: Nurse Practitioner

## 2010-10-04 ENCOUNTER — Ambulatory Visit: Payer: Self-pay | Admitting: Nurse Practitioner

## 2010-10-04 DIAGNOSIS — R21 Rash and other nonspecific skin eruption: Secondary | ICD-10-CM | POA: Insufficient documentation

## 2010-10-06 ENCOUNTER — Telehealth (INDEPENDENT_AMBULATORY_CARE_PROVIDER_SITE_OTHER): Payer: Self-pay | Admitting: Nurse Practitioner

## 2010-10-12 ENCOUNTER — Encounter (INDEPENDENT_AMBULATORY_CARE_PROVIDER_SITE_OTHER): Payer: Self-pay | Admitting: Nurse Practitioner

## 2010-11-27 ENCOUNTER — Emergency Department (HOSPITAL_COMMUNITY): Admission: EM | Admit: 2010-11-27 | Discharge: 2010-11-27 | Payer: Self-pay | Admitting: Emergency Medicine

## 2011-01-04 ENCOUNTER — Ambulatory Visit: Admit: 2011-01-04 | Payer: Self-pay | Admitting: Nurse Practitioner

## 2011-01-20 ENCOUNTER — Encounter: Payer: Self-pay | Admitting: Occupational Therapy

## 2011-01-21 ENCOUNTER — Encounter: Payer: Self-pay | Admitting: Internal Medicine

## 2011-01-21 ENCOUNTER — Encounter: Payer: Self-pay | Admitting: Occupational Therapy

## 2011-01-21 ENCOUNTER — Encounter: Payer: Self-pay | Admitting: Family Medicine

## 2011-01-28 LAB — CONVERTED CEMR LAB
Albumin ELP: 51.3 % — ABNORMAL LOW (ref 55.8–66.1)
Alpha-1-Globulin: 3.7 % (ref 2.9–4.9)
Alpha-2-Globulin: 8.4 % (ref 7.1–11.8)
Gamma Globulin: 25.1 % — ABNORMAL HIGH (ref 11.1–18.8)

## 2011-01-30 NOTE — Letter (Signed)
Summary: [facility]//CONSULTATION  [facility]//CONSULTATION   Imported By: Arta Bruce 10/02/2010 15:12:44  _____________________________________________________________________  External Attachment:    Type:   Image     Comment:   External Document

## 2011-01-30 NOTE — Letter (Signed)
Summary: Cytopathology Report  Cytopathology Report   Imported By: Percell Miller 02/24/2010 14:49:04  _____________________________________________________________________  External Attachment:    Type:   Image     Comment:   External Document

## 2011-01-30 NOTE — Assessment & Plan Note (Signed)
Summary: Acute - UTI   Vital Signs:  Patient profile:   55 year old female Menstrual status:  perimenopausal Weight:      273.6 pounds BMI:     43.99 BSA:     2.29 Temp:     98.2 degrees F oral Pulse rate:   80 / minute Pulse rhythm:   regular Resp:     20 per minute BP sitting:   132 / 84  (left arm) Cuff size:   large  Vitals Entered By: Diane Sutton (May 02, 2010 11:23 AM) CC: right upper quandrant still giving pain it locked up on her last week with some cough and congestion. Is Patient Diabetic? No Pain Assessment Patient in pain? no       Does patient need assistance? Functional Status Self care Ambulation Normal   Primary Care Provider:  Lehman Prom FNP  CC:  right upper quandrant still giving pain it locked up on her last week with some cough and congestion.[name]  History of Present Illness:  Pt into the office with complaints of right side pain and later admits that pain is also on the left intermittent - comes and goes postitional usually but also notices at times without movement Denies any dysuria or hematuria No fever c/o nasal congestion r/t allergies but she continues to take loratadine 10mg   Previously prescribed a nasal spray but has completed her supply -ear pain +sore throat +headache  Medications Prior to Update: 1)  Nabumetone 500 Mg  Tabs (Nabumetone) .... Take Tablet Every 12 Hours or When Needed 2)  Lexapro 20 Mg  Tabs (Escitalopram Oxalate) .... Take 1 Tablet By Mouth Once A Day 3)  Seroquel 200 Mg  Tabs (Quetiapine Fumarate) .... Take 1 Tab By Mouth At Bedtime(Per [facility]) 4)  Claritin 10 Mg  Tabs (Loratadine) .... Take 1 Tablet By Mouth Once A Day As Needed For Ear Itching 5)  Triamcinolone Acetonide 0.1 % Oint (Triamcinolone Acetonide) .... One Application Topically Two Times A Day As Needed 6)  Nystatin-Triamcinolone 100000-0.1 Unit/gm-% Oint (Nystatin-Triamcinolone) .... One Application Topically To Affected Area Two Times A Day  Until Healed 7)  Voltaren 1 % Gel (Diclofenac Sodium) .... Apply 4gm To Knees Two Times A Day **pharmacy - D/c Flector Patch**  Allergies (verified): No Known Drug Allergies  Review of Systems ENT:  Complains of nasal congestion and sore throat; denies earache. CV:  Denies chest pain or discomfort. Resp:  Denies cough. GI:  Denies abdominal pain, nausea, and vomiting. GU:  Denies dysuria. MS:  right side pain. Neuro:  Complains of headaches; improved with nabumetone. Allergy:  Complains of seasonal allergies.  Physical Exam  General:  alert.  obese Head:  normocephalic.   Ears:  ear piercing(s) noted.   bil TM with clear fluid - no erythema Nose:  no nasal discharge.   Mouth:  fair dentition.   Abdomen:  left flank pain   Impression & Recommendations:  Problem # 1:  UTI (ICD-599.0) will send urine for culture Her updated medication list for this problem includes:    Bactrim 400-80 Mg Tabs (Sulfamethoxazole-trimethoprim) ..... One tablet by mouth two times a day for infection  Orders: T-Culture, Urine ([id number]) UA Dipstick w/o Micro (manual) (47829)  Problem # 2:  ALLERGIC RHINITIS (ICD-477.9) will refill meds advised pt of the dx Her updated medication list for this problem includes:    Claritin 10 Mg Tabs (Loratadine) .[name]... Take 1 tablet by mouth once a day as needed for ear  itching    Fluticasone Propionate 50 Mcg/act Susp (Fluticasone propionate) ..... One spray in each nostril two times a day  Complete Medication List: 1)  Nabumetone 500 Mg Tabs (Nabumetone) .... Take tablet every 12 hours or when needed 2)  Lexapro 20 Mg Tabs (Escitalopram oxalate) .... Take 1 tablet by mouth once a day 3)  Seroquel 200 Mg Tabs (quetiapine Fumarate)  .... Take 1 tab by mouth at bedtime(per [facility]) 4)  Claritin 10 Mg Tabs (Loratadine) .... Take 1 tablet by mouth once a day as needed for ear itching 5)  Triamcinolone Acetonide 0.1 % Oint (Triamcinolone acetonide) .... One  application topically two times a day as needed 6)  Nystatin-triamcinolone 100000-0.1 Unit/gm-% Oint (Nystatin-triamcinolone) .... One application topically to affected area two times a day until healed 7)  Voltaren 1 % Gel (Diclofenac sodium) .... Apply 4gm to knees two times a day **pharmacy - d/c flector patch** 8)  Fluticasone Propionate 50 Mcg/act Susp (Fluticasone propionate) .... One spray in each nostril two times a day 9)  Bactrim 400-80 Mg Tabs (Sulfamethoxazole-trimethoprim) .... One tablet by mouth two times a day for infection  Patient Instructions: 1)  Buy Tylenol cold and sinus over the counter and take twice daily for 3 days (Tuesday, Wednesday, Thursday) then restart on the loratadine 10mg  by mouth daily 2)  Use the nasal spray - flonase: 1 spray in each nostril two times a day (hold head down) 3)  May use throat lozengers such as sucrets or drink warm tea for sore throat 4)  follow up as needed 5)  You have infection in your urine. Take macrobid twice daily for infection Prescriptions: BACTRIM 400-80 MG TABS (SULFAMETHOXAZOLE-TRIMETHOPRIM) One tablet by mouth two times a day for infection  #14 x 0   Entered and Authorized by:   Lehman Prom FNP   Signed by:   Lehman Prom FNP on 05/02/2010   Method used:   [facility]  [address] [phone]* (retail)       [address]       [place], [place]  [id number]       Ph: [phone] or [phone]       Fax: [phone]   RxID:   [id number] FLUTICASONE PROPIONATE 50 MCG/ACT SUSP (FLUTICASONE PROPIONATE) One spray in each nostril two times a day  #1 x 3   Entered and Authorized by:   Lehman Prom FNP   Signed by:   Lehman Prom FNP on 05/02/2010   Method used:   [facility]        [facility]  [address] [phone]* (retail)       [address]       [place], [place]  [address]       Ph: [phone] or [phone]       Fax:  [phone]   RxID:   [id number]   Laboratory Results   Urine Tests  Date/Time Received: May 02, 2010 12:40 PM   Routine Urinalysis   Color: lt. yellow Appearance: Clear Glucose: negative   (Normal Range: Negative) Bilirubin: negative   (Normal Range: Negative) Ketone: negative   (Normal Range: Negative) Spec. Gravity: >=1.030   (Normal Range: 1.003-1.035) Blood: negative   (Normal Range: Negative) pH: 6.0   (Normal Range: 5.0-8.0) Protein: negative   (Normal Range: Negative) Urobilinogen: 0.2   (Normal  Range: 0-1) Nitrite: positive   (Normal Range: Negative) Leukocyte Esterace: negative   (Normal Range: Negative)

## 2011-01-30 NOTE — Progress Notes (Signed)
Summary: Endocrine referral update  Phone Note Outgoing Call   Summary of Call: Endocrinology referral ordered on 03/20/2010 (see phone note) checking status - pt seen today and has not been contacted Initial call taken by: Lehman Prom FNP,  June 02, 2010 4:48 PM  Follow-up for Phone Call        I never got this referral but will try to work on this one today Follow-up by: Candi Leash,  June 05, 2010 9:53 AM  Additional Follow-up for Phone Call Additional follow up Details #1::        ok.  Looks like phone note was signed on 03/20/2010 but that's why I ask the pt's to let me know if they have not heard from referral withina 2 week period of time so we can be sure we are capturing all the results Additional Follow-up by: Lehman Prom FNP,  June 05, 2010 11:41 AM    Additional Follow-up for Phone Call Additional follow up Details #2::    You' re right, I did signed  off on the phone note. throught  I overlooked this one but, Just looked @ my spreadsheet for the month of March.Pt was referred to Napoleon Form @ [facility] [place] [phone] in march.The office tried to contact pt several times.Was told  Four messages was left and Pt failed to return any of the calls.  Follow-up by: Candi Leash,  June 06, 2010 4:37 PM  Additional Follow-up for Phone Call Additional follow up Details #3:: Details for Additional Follow-up Action Taken: how should I proceed - the next time she is in office get someone to call Dr. Sharl Ma and set the appt up then. Pt is living in [place] house at this time so phone number may be unreliable. I think that is her mother's number [email or web address],fnp  June 07, 2010  7:50 AM  Well I faxed the updated notes & labs back to Dr.Kerr to see if they would try to sched her again.Marland KitchenMarland KitchenDfinley 06-07-10  noted. June 07, 2010  1:16 PM   Additional Follow-up by: Candi Leash,  June 07, 2010 12:16 PM

## 2011-01-30 NOTE — Letter (Signed)
Summary: MAILED REQUESTED LABS TO [facility]  MAILED REQUESTED LABS TO [facility]   Imported By: Arta Bruce 04/07/2010 10:57:55  _____________________________________________________________________  External Attachment:    Type:   Image     Comment:   External Document

## 2011-01-30 NOTE — Progress Notes (Signed)
Summary: Refills Request  Phone Note Call from Patient Call back at [phone]   Summary of Call: The pt needs refills from tramadol medication.  [facility]  Please call her back when is ready. [facility] FNP Initial call taken by: Manon Hilding,  August 01, 2010 2:02 PM  Follow-up for Phone Call        forward to N. Daphine Deutscher, FNP last filled 06/21/10  Follow-up by: Levon Hedger,  August 01, 2010 2:27 PM  Additional Follow-up for Phone Call Additional follow up Details #1::        Rx sent electronically notify pt Additional Follow-up by: Lehman Prom FNP,  August 01, 2010 3:04 PM    Additional Follow-up for Phone Call Additional follow up Details #2::    pt notified. Follow-up by: Levon Hedger,  August 01, 2010 5:18 PM  Prescriptions: TRAMADOL HCL 50 MG TABS (TRAMADOL HCL) One tablet by mouth two times a day as needed for pain  #50 x 0   Entered and Authorized by:   Lehman Prom FNP   Signed by:   Lehman Prom FNP on 08/01/2010   Method used:   [facility]        [facility]  [address] [phone]* (retail)       [address]       [place], [place]  [address]       Ph: [phone] or [phone]       Fax: [phone]   RxID:   [id number]

## 2011-01-30 NOTE — Letter (Signed)
Summary: *HSN Results Follow up  [facility]  [address] [place], [place] [address]   Phone: [phone]  Fax: [phone]      06/09/2010   Diane Sutton [address] [place], [place]  [address]   Dear  Ms. [name],                            ____S.Drinkard,FNP   ____D. Gore,FNP       ____B. McPherson,MD   ____V. Rankins,MD    ____E. Mulberry,MD    __X__N. Daphine Deutscher, FNP  ____D. Reche Dixon, MD    ____K. Philipp Deputy, MD    ____Other     This letter is to inform you that your recent test(s):  _______Pap Smear    _______Lab Test     ____X___X-ray    Comments: X-rays of the right knee show that you have arthritis.       _________________________________________________________ If you have any questions, please contact our office [phone].                    Sincerely,    Lehman Prom FNP [facility]

## 2011-01-30 NOTE — Assessment & Plan Note (Signed)
Summary: Acute - Candiasis   Vital Signs:  Patient profile:   55 year old female Menstrual status:  perimenopausal Weight:      272.0 pounds BMI:     43.73 BSA:     2.29 Temp:     97.4 degrees F oral Pulse rate:   85 / minute Pulse rhythm:   regular Resp:     20 per minute BP sitting:   114 / 77  (left arm) Cuff size:   large  Vitals Entered By: Levon Hedger (April 05, 2010 9:34 AM) CC: rash on chest, breast and nipples that has been there for a while, Rash Is Patient Diabetic? No Pain Assessment Patient in pain? no       Does patient need assistance? Functional Status Self care Ambulation Normal   Primary Care Provider:  Lehman Prom FNP  CC:  rash on chest, breast and nipples that has been there for a while, and Rash.  History of Present Illness:  Pt into the office with complaints of rash.   Rash      This is a 55 year old woman who presents with Rash.  Rash presented previously but but used cream and the area improved.  The patient denies papules and blisters.  The rash is located on the chest.  The patient denies the following symptoms: fever.  The patient denies history of recent tick bite, recent infection, recent antibiotic use, new medication, and new topical exposure.    Medications Prior to Update: 1)  Nabumetone 500 Mg  Tabs (Nabumetone) .... Take Tablet Every 12 Hours or When Needed 2)  Lexapro 20 Mg  Tabs (Escitalopram Oxalate) .... Take 1 Tablet By Mouth Once A Day 3)  Seroquel 200 Mg  Tabs (Quetiapine Fumarate) .... Take 1 Tab By Mouth At Bedtime(Per [facility]) 4)  Claritin 10 Mg  Tabs (Loratadine) .... Take 1 Tablet By Mouth Once A Day As Needed For Ear Itching 5)  Triamcinolone Acetonide 0.1 % Oint (Triamcinolone Acetonide) .... One Application Topically Two Times A Day As Needed 6)  Nystatin-Triamcinolone 100000-0.1 Unit/gm-% Oint (Nystatin-Triamcinolone) .... One Application Topically To Affected Area Two Times A Day Until Healed 7)  Voltaren 1 %  Gel (Diclofenac Sodium) .... Apply 4gm To Knees Two Times A Day **pharmacy - D/c Flector Patch** 8)  Avelox 400 Mg Tabs (Moxifloxacin Hcl) .... One Tablet By Mouth Daily For Infection  Allergies (verified): No Known Drug Allergies  Review of Systems CV:  Denies chest pain or discomfort. Resp:  Denies cough. GI:  Denies abdominal pain, nausea, and vomiting. Derm:  Complains of rash.  Physical Exam  General:  alert.   Head:  normocephalic.   Breasts:  hyperpigmented rash under bilateral brest and some mid-sternal, no papules or blisters noted Lungs:  normal breath sounds.   Heart:  normal rate and regular rhythm.   Neurologic:  waddling gait   Impression & Recommendations:  Problem # 1:  CANDIDIASIS (ICD-112.9) advised pt of the dx will order mycolog cream  Problem # 2:  HYPERTHYROIDISM (ICD-242.90) pt is awaiting endocrinology referral  Complete Medication List: 1)  Nabumetone 500 Mg Tabs (Nabumetone) .... Take tablet every 12 hours or when needed 2)  Lexapro 20 Mg Tabs (Escitalopram oxalate) .... Take 1 tablet by mouth once a day 3)  Seroquel 200 Mg Tabs (quetiapine Fumarate)  .... Take 1 tab by mouth at bedtime(per [facility]) 4)  Claritin 10 Mg Tabs (Loratadine) .... Take 1 tablet by mouth once a  day as needed for ear itching 5)  Triamcinolone Acetonide 0.1 % Oint (Triamcinolone acetonide) .... One application topically two times a day as needed 6)  Nystatin-triamcinolone 100000-0.1 Unit/gm-% Oint (Nystatin-triamcinolone) .... One application topically to affected area two times a day until healed 7)  Voltaren 1 % Gel (Diclofenac sodium) .... Apply 4gm to knees two times a day **pharmacy - d/c flector patch**  Patient Instructions: 1)  Use the cream as ordered 2)  You should be contacted regarding your endocrinology referral Prescriptions: NYSTATIN-TRIAMCINOLONE 100000-0.1 UNIT/GM-% OINT (NYSTATIN-TRIAMCINOLONE) One application topically to affected area two times a day until  healed  #60gm x 1   Entered and Authorized by:   Lehman Prom FNP   Signed by:   Lehman Prom FNP on 04/05/2010   Method used:   Print then Give to Patient   RxID:   [id number] NYSTATIN-TRIAMCINOLONE 100000-0.1 UNIT/GM-% OINT (NYSTATIN-TRIAMCINOLONE) One application topically to affected area two times a day until healed  #60gm x 1   Entered and Authorized by:   Lehman Prom FNP   Signed by:   Lehman Prom FNP on 04/05/2010   Method used:   Print then Give to Patient   RxID:   [id number]

## 2011-01-30 NOTE — Assessment & Plan Note (Signed)
Summary: F/u ER visit - Pneumonia   Vital Signs:  Patient profile:   55 year old female Menstrual status:  perimenopausal Weight:      274.2 pounds BMI:     44.08 BSA:     2.30 O2 Sat:      96 % on Room air Temp:     98.0 degrees F oral Pulse rate:   82 / minute Pulse rhythm:   regular Resp:     20 per minute BP sitting:   131 / 86  (left arm) Cuff size:   large  Vitals Entered By: Levon Hedger (March 09, 2010 12:29 PM)  O2 Flow:  Room air CC: cough with soreness in right side with and without cough...pt went to [place] on last thursday Is Patient Diabetic? No Pain Assessment Patient in pain? no       Does patient need assistance? Functional Status Self care Ambulation Normal   Primary Care Provider:  Lehman Prom FNP  CC:  cough with soreness in right side with and without cough...pt went to [place] on last thursday.  History of Present Illness:  Pt into the office for follow up from two ER visits, which the last being on 03/05/2010 (full ER visit reviewed) Dx with influenza and then Pneumonia. Pt has finished her z-pack as ordered.  She was also given tamiflu of which she has also finished. Cough persists -she was not able to afford the cough syrup as ordered by the ER.  She instead purchased some OTC Robitussin for cough. Continues with pain in right flank Cough is persistant but non-productive admits to loss of appetite No nausea or vomiting Still with some weakness and fatigue  Allergies (verified): No Known Drug Allergies  Review of Systems General:  Complains of fever; denies loss of appetite. CV:  Denies chest pain or discomfort. Resp:  Complains of cough. GI:  Denies abdominal pain, nausea, and vomiting.  Physical Exam  General:  alert.   Head:  normocephalic.   Eyes:  strabismus.   Lungs:  diffuse Rhonchi throughout Heart:  normal rate and regular rhythm.   Msk:  up to the exam table Neurologic:  alert & oriented X3.   Psych:  Oriented  X3.     Impression & Recommendations:  Problem # 1:  PNEUMONIA (ICD-486) will start avelox as pt still has not fully re-cooperated advised her to alternate ner nabumetome and tylenol for pain (schedule written for pt) pulse ox 96% in office still take robutussion as needed for cough Her updated medication list for this problem includes:    Avelox 400 Mg Tabs (Moxifloxacin hcl) ..... One tablet by mouth daily for infection  Orders: Pulse Oximetry (single measurment) (94760)  Complete Medication List: 1)  Nabumetone 500 Mg Tabs (Nabumetone) .... Take tablet every 12 hours or when needed 2)  Lexapro 20 Mg Tabs (Escitalopram oxalate) .... Take 1 tablet by mouth once a day 3)  Seroquel 200 Mg Tabs (quetiapine Fumarate)  .... Take 1 tab by mouth at bedtime(per [facility]) 4)  Claritin 10 Mg Tabs (Loratadine) .... Take 1 tablet by mouth once a day as needed for ear itching 5)  Triamcinolone Acetonide 0.1 % Oint (Triamcinolone acetonide) .... One application topically two times a day as needed 6)  Nystatin-triamcinolone 100000-0.1 Unit/gm-% Oint (Nystatin-triamcinolone) .... One application topically to affected area two times a day until healed 7)  Voltaren 1 % Gel (Diclofenac sodium) .... Apply 4gm to knees two times a day **pharmacy -  d/c flector patch** 8)  Avelox 400 Mg Tabs (Moxifloxacin hcl) .... One tablet by mouth daily for infection  Patient Instructions: 1)  Follow up in 1 week with n.martin for pneumonia. 2)  Take your medications as ordered Prescriptions: AVELOX 400 MG TABS (MOXIFLOXACIN HCL) One tablet by mouth daily for infection  #7 x 0   Entered and Authorized by:   Lehman Prom FNP   Signed by:   Lehman Prom FNP on 03/09/2010   Method used:   [facility]ically to        [facility]  [address] [phone]* (retail)       [address]       [place], [place]  [address]       Ph: [phone] or [phone]       Fax: [phone]   RxID:    [id number]    CT of Abdomen  Procedure date:  03/05/2010  Findings:      limited examination due to the patient's body habitus and bowel gas. the gallbladder is contracted. No evidence for large gallstones  CXR  Procedure date:  03/05/2010  Findings:      New ill defined right lower lobe airspace disease which could represent pneumonia given the history of right upper quadrant abdominal pain   CT of Abdomen  Procedure date:  03/05/2010  Findings:      limited examination due to the patient's body habitus and bowel gas. the gallbladder is contracted. No evidence for large gallstones  CXR  Procedure date:  03/05/2010  Findings:      New ill defined right lower lobe airspace disease which could represent pneumonia given the history of right upper quadrant abdominal pain

## 2011-01-30 NOTE — Assessment & Plan Note (Signed)
Summary: Right knee pain   Vital Signs:  Patient profile:   55 year old female Menstrual status:  perimenopausal Weight:      275.6 pounds BMI:     44.31 BSA:     2.30 Temp:     97.4 degrees F oral Pulse rate:   73 / minute Pulse rhythm:   regular Resp:     16 per minute BP sitting:   130 / 86  (left arm) Cuff size:   large  Vitals Entered By: Levon Hedger (June 02, 2010 2:52 PM) CC: right knee pain x 2 days off and on in the day and night Is Patient Diabetic? No Pain Assessment Patient in pain? yes     Location: knee Onset of pain  Intermittent  Does patient need assistance? Functional Status Self care Ambulation Normal   Primary Care Provider:  Lehman Prom FNP  CC:  right knee pain x 2 days off and on in the day and night.  History of Present Illness:  Pt into the office for right knee pain. Reports that 2 days ago she was walking and the right knee gave out on her. She was able to catch herself Ungoing pain in the knees for which she takes arthritis medications. When she takes that Nabumetone which does allow some relief. No current x-rays  pt does not have a splint or brace to put on her leg Denies any increase in walking even though she is currently at the [facility]. She spends the days at her mother's house.  Thyoid nodule - noted on u/s and pt had Thyroid uptake scan and a biopsy. She was to be referred to endocrinology but she has not received notification of this referral  Social - pt has recently moved to the [employer].  Medications Prior to Update: 1)  Nabumetone 500 Mg  Tabs (Nabumetone) .... Take Tablet Every 12 Hours or When Needed 2)  Lexapro 20 Mg  Tabs (Escitalopram Oxalate) .... Take 1 Tablet By Mouth Once A Day 3)  Seroquel 200 Mg  Tabs (Quetiapine Fumarate) .... Take 1 Tab By Mouth At Bedtime(Per [facility]) 4)  Claritin 10 Mg  Tabs (Loratadine) .... Take 1 Tablet By Mouth Once A Day As Needed For Ear Itching 5)  Triamcinolone  Acetonide 0.1 % Oint (Triamcinolone Acetonide) .... One Application Topically Two Times A Day As Needed 6)  Nystatin-Triamcinolone 100000-0.1 Unit/gm-% Oint (Nystatin-Triamcinolone) .... One Application Topically To Affected Area Two Times A Day Until Healed 7)  Voltaren 1 % Gel (Diclofenac Sodium) .... Apply 4gm To Knees Two Times A Day **pharmacy - D/c Flector Patch** 8)  Fluticasone Propionate 50 Mcg/act Susp (Fluticasone Propionate) .... One Spray in Each Nostril Two Times A Day  Allergies (verified): No Known Drug Allergies  Review of Systems CV:  Denies chest pain or discomfort. Resp:  Denies cough. GI:  Denies abdominal pain, nausea, and vomiting. MS:  Complains of joint pain; right knee pain.  Physical Exam  General:  alert.  obese Head:  normocephalic.   Lungs:  normal breath sounds.   Heart:  normal rate and regular rhythm.   Abdomen:  normal bowel sounds.   Msk:  normal ROM.   Neurologic:  alert & oriented X3.   Skin:  color normal.   Psych:  Oriented X3.     Knee Exam  Knee Exam:    Right:    Inspection:  Normal    Palpation:  Normal    Stability:  stable    Tenderness:  no    Swelling:  no    Erythema:  no   Impression & Recommendations:  Problem # 1:  KNEE PAIN, RIGHT (ICD-719.46) ACE wrap will order x-ray Her updated medication list for this problem includes:    Nabumetone 500 Mg Tabs (Nabumetone) .[name]... Take tablet every 12 hours or when needed  Orders: Radiology other (Radiology Other)  Problem # 2:  THYROID NODULE, RIGHT (ICD-241.0) pt is pending endocrinology referral will f/u to check status  Complete Medication List: 1)  Nabumetone 500 Mg Tabs (Nabumetone) .... Take tablet every 12 hours or when needed 2)  Lexapro 20 Mg Tabs (Escitalopram oxalate) .... Take 1 tablet by mouth once a day 3)  Seroquel 200 Mg Tabs (quetiapine Fumarate)  .... Take 1 tab by mouth at bedtime(per [facility]) 4)  Claritin 10 Mg Tabs (Loratadine) .... Take 1 tablet by  mouth once a day as needed for ear itching 5)  Nystatin-triamcinolone 100000-0.1 Unit/gm-% Oint (Nystatin-triamcinolone) .... One application topically to affected area two times a day until healed 6)  Voltaren 1 % Gel (Diclofenac sodium) .... Apply 4gm to knees two times a day **pharmacy - d/c flector patch** 7)  Fluticasone Propionate 50 Mcg/act Susp (Fluticasone propionate) .... One spray in each nostril two times a day  Patient Instructions: 1)  Get the x-ray of the right knee. 2)  Use the ace wrap to right knee. 3)  Follow up as needed

## 2011-01-30 NOTE — Assessment & Plan Note (Signed)
Summary: Thyroid nodule   Vital Signs:  Patient profile:   55 year old female Menstrual status:  perimenopausal Weight:      275.4 pounds BMI:     44.28 BSA:     2.30 Temp:     97.5 degrees F oral Pulse rate:   69 / minute Pulse rhythm:   regular Resp:     16 per minute BP sitting:   139 / 80  (left arm) Cuff size:   large  Vitals Entered By: Levon Hedger (January 16, 2010 3:31 PM) CC: follow-up visisti....knot in neck with pain Is Patient Diabetic? No Pain Assessment Patient in pain? yes     Location: neck  Does patient need assistance? Functional Status Self care Ambulation Normal     Menstrual Status perimenopausal Last PAP Result normal   Primary Care Provider:  Lehman Prom FNP  CC:  follow-up visisti....knot in neck with pain.  History of Present Illness:  Pt into the office for f/u on thyroid. Pt has a history of hyperthyroidism and was previously set up for a thyroid u/s but she no-showed for the procedure. Pt f/u in office and on last visit the procedure was explained and why she needed it. Pt has returned today stating that she would like to move forward with the thyroid biopsy.  Medications Prior to Update: 1)  Nabumetone 500 Mg  Tabs (Nabumetone) .... Take Tablet Every 12 Hours or When Needed 2)  Lexapro 20 Mg  Tabs (Escitalopram Oxalate) .... Take 1 Tablet By Mouth Once A Day 3)  Seroquel 200 Mg  Tabs (Quetiapine Fumarate) .... Take 1 Tab By Mouth At Bedtime(Per [facility]) 4)  Claritin 10 Mg  Tabs (Loratadine) .... Take 1 Tablet By Mouth Once A Day As Needed For Ear Itching 5)  Triamcinolone Acetonide 0.1 % Oint (Triamcinolone Acetonide) .... One Application Topically Two Times A Day As Needed 6)  Nystatin-Triamcinolone 100000-0.1 Unit/gm-% Oint (Nystatin-Triamcinolone) .... One Application Topically To Affected Area Two Times A Day Until Healed 7)  Voltaren 1 % Gel (Diclofenac Sodium) .... Apply 4gm To Knees Two Times A Day **pharmacy - D/c Flector  Patch**  Allergies (verified): No Known Drug Allergies  Review of Systems General:  Denies fever. ENT:  Denies earache. CV:  Denies chest pain or discomfort. Resp:  Complains of cough. MS:  Complains of joint pain; arthritis in knees that has improved with Voltaren Gel.  .  Physical Exam  General:  alert.   Head:  normocephalic.   Mouth:  pharynx pink and moist.   Neck:  right thyromegly full ROM.   Lungs:  normal breath sounds.   Heart:  normal rate and regular rhythm.     Impression & Recommendations:  Problem # 1:  THYROID NODULE, RIGHT (ICD-241.0) will set up for biopsy - pt is not in agreement  Complete Medication List: 1)  Nabumetone 500 Mg Tabs (Nabumetone) .... Take tablet every 12 hours or when needed 2)  Lexapro 20 Mg Tabs (Escitalopram oxalate) .... Take 1 tablet by mouth once a day 3)  Seroquel 200 Mg Tabs (quetiapine Fumarate)  .... Take 1 tab by mouth at bedtime(per [facility]) 4)  Claritin 10 Mg Tabs (Loratadine) .... Take 1 tablet by mouth once a day as needed for ear itching 5)  Triamcinolone Acetonide 0.1 % Oint (Triamcinolone acetonide) .... One application topically two times a day as needed 6)  Nystatin-triamcinolone 100000-0.1 Unit/gm-% Oint (Nystatin-triamcinolone) .... One application topically to affected area two times a day  until healed 7)  Voltaren 1 % Gel (Diclofenac sodium) .... Apply 4gm to knees two times a day **pharmacy - d/c flector patch**  Patient Instructions: 1)  You will be scheduled for thyroid biopsy. 2)  You will be informed of the time/date of this appointment

## 2011-01-30 NOTE — Assessment & Plan Note (Signed)
Summary: Rash/Hyperthyroidism   Vital Signs:  Patient profile:   55 year old female Menstrual status:  perimenopausal Weight:      268.2 pounds BMI:     43.12 Temp:     97.6 degrees F oral Pulse rate:   80 / minute Pulse rhythm:   regular Resp:     20 per minute BP sitting:   124 / 84  (left arm) Cuff size:   large  Vitals Entered By: Levon Hedger (October 04, 2010 12:20 PM)  Nutrition Counseling: Patient's BMI is greater than 25 and therefore counseled on weight management options. CC: rash on body in certain areas that is still there, Rash Is Patient Diabetic? No Pain Assessment Patient in pain? no       Does patient need assistance? Functional Status Self care Ambulation Normal   Primary Care Provider:  Lehman Prom FNP  CC:  rash on body in certain areas that is still there and Rash.  History of Present Illness:  Pt into the office for routine 3 month f/u. Still with rash on aerola. Improved some from previous check. not as puritic as previously.  Thyroid - pt was seen by Dr. Sharl Ma in July.  she did go for thyroid uptake scan as ordered but was never contacted for a f/u visit.  Pt would like to have f/u and would like for this office to arrange that f/u appt.  Social - pt still lives with her sister and is hoping to get her own place soon. Contact # in chart is her mother's.    Rash      This is a 18 year old woman who presents with Rash.  The symptoms began 3 months ago.  The severity is described as mild.  The patient complains of scaling, but denies blisters and tenderness.  The rash is located on the chest.  The patient denies the following symptoms: fever, headache, and facial swelling.    Allergies (verified): No Known Drug Allergies  Review of Systems CV:  Denies chest pain or discomfort. Resp:  Denies cough. GI:  Denies abdominal pain, nausea, and vomiting. MS:  Complains of joint pain; intermittent knee pain.  Physical Exam  General:   alert.  obese Head:  normocephalic.   Breasts:  areolae with scaly flaky skin Lungs:  normal breath sounds.   Heart:  normal rate and regular rhythm.   Abdomen:  normal bowel sounds.   Msk:  up to the exam table Neurologic:  alert & oriented X3.   Psych:  Oriented X3.     Impression & Recommendations:  Problem # 1:  ARTHRITIS, RIGHT KNEE (ICD-716.96) pt advised to continue anti-inflammatory  Problem # 2:  ALLERGIC RHINITIS (ICD-477.9)  Her updated medication list for this problem includes:    Claritin 10 Mg Tabs (Loratadine) .[name]... Take 1 tablet by mouth once a day as needed for ear itching    Fluticasone Propionate 50 Mcg/act Susp (Fluticasone propionate) ..... One spray in each nostril two times a day  Problem # 3:  HYPERTHYROIDISM (ICD-242.90) will refer to pt to Dr. Sharl Ma  Problem # 4:  NEED PROPHYLACTIC VACCINATION&INOCULATION FLU (ICD-V04.81) flu vaccine given  Problem # 5:  SKIN RASH (ICD-782.1)  Her updated medication list for this problem includes:    Hydrocortisone 2.5 % Oint (Hydrocortisone) ..... One application topically two times a day as needed for rash  Complete Medication List: 1)  Nabumetone 500 Mg Tabs (Nabumetone) .... Take tablet every 12 hours  or when needed 2)  Lexapro 20 Mg Tabs (Escitalopram oxalate) .... Take 1 tablet by mouth once a day 3)  Seroquel 200 Mg Tabs (quetiapine Fumarate)  .... Take 1 tab by mouth at bedtime(per [facility]) 4)  Claritin 10 Mg Tabs (Loratadine) .... Take 1 tablet by mouth once a day as needed for ear itching 5)  Hydrocortisone 2.5 % Oint (Hydrocortisone) .... One application topically two times a day as needed for rash 6)  Fluticasone Propionate 50 Mcg/act Susp (Fluticasone propionate) .... One spray in each nostril two times a day 7)  Tramadol Hcl 50 Mg Tabs (Tramadol hcl) .... One tablet by mouth two times a day as needed for pain  Other Orders: Flu Vaccine 30yrs + (09811) Admin 1st Vaccine (91478) Admin 1st Vaccine [facility])  [phone])  Patient Instructions: 1)  You have received the flu vaccine today. 2)  This office will call Dr. Daune Perch office and find out when they want to see you again.  You can either call or come back here on Friday to get this appointment  3)  Follow up in 3 months with n.martin, fnp for a complete physical exam.  No food after midnight before this visit. 4)  will need PAP, mammogram, EKG, labs, u/a Prescriptions: HYDROCORTISONE 2.5 % OINT (HYDROCORTISONE) One application topically two times a day as needed for rash  #30gm x 1   Entered and Authorized by:   Lehman Prom FNP   Signed by:   Lehman Prom FNP on 10/04/2010   Method used:   Print then Give to Patient   RxID:   [id number] TRAMADOL HCL 50 MG TABS (TRAMADOL HCL) One tablet by mouth two times a day as needed for pain  #50 x 0   Entered and Authorized by:   Lehman Prom FNP   Signed by:   Lehman Prom FNP on 10/04/2010   Method used:   Print then Give to Patient   RxID:   [id number]      Influenza Vaccine    Vaccine Type: Fluvax 3+    Site: left deltoid    Mfr: GlaxoSmithKline    Dose: 0.5 ml    Route: IM    Given by: [place] Shannon    Exp. Date: 06/2011    Lot #: [id number]    VIS given: 07/25/10 version given October 04, 2010.  Flu Vaccine Consent Questions    Do you have a history of severe allergic reactions to this vaccine? no    Any prior history of allergic reactions to egg and/or gelatin? no    Do you have a sensitivity to the preservative Thimersol? no    Do you have a past history of Guillan-Barre Syndrome? no    Do you currently have an acute febrile illness? no    Have you ever had a severe reaction to latex? no    Vaccine information given and explained to patient? yes    Are you currently pregnant? no

## 2011-01-30 NOTE — Assessment & Plan Note (Signed)
Summary: Right knee pain   Vital Signs:  Patient profile:   55 year old female Menstrual status:  perimenopausal Weight:      277.4 pounds BMI:     44.60 Pulse rate:   80 / minute Pulse rhythm:   regular Resp:     20 per minute BP sitting:   132 / 85  (left arm) Cuff size:   large  Vitals Entered By: Levon Hedger (June 21, 2010 12:02 PM) CC: still having a problem with right knee that hurts really bad from time to time the voltaren helps some but it does not seem to do the trick Is Patient Diabetic? No Pain Assessment Patient in pain? yes     Location: knee  Does patient need assistance? Functional Status Self care Ambulation Normal   Primary Care Provider:  Lehman Prom FNP  CC:  still having a problem with right knee that hurts really bad from time to time the voltaren helps some but it does not seem to do the trick.  History of Present Illness:  Pt into the office for 3 month f/u however pt has been in this office several times since initially making this appointment. Right knee pain - intermittent pain recent x-rays done - degenerative arthritic changes pt uses voltaren gel and nabumetome without complete resolution of symtoms Pain doesn't seem to change to motion.  Pt admits that she has pain even when sitting.  Social - Pt is now in the [place] house and will be there until July 2nd.  after which time she will move to a hotel.  Pt is trying to get an apartment but at this point is not able to afford the rent  Endocrinologist - Referral sent to Dr. Sharl Ma however unable to contact pt with appt (for reasons as stated above)  Hx: hyperthyroidism of which she needed f/t - had u/s and biopsy done  [facility] - last seen there earlier this week and presents today with a lab sheet. Pt should have been fasting and is not so she will need to return for labs.  Habits & Providers  Alcohol-Tobacco-Diet     Alcohol drinks/day: 0     Tobacco Status: never  Passive Smoke Exposure: no  Exercise-Depression-Behavior     Does Patient Exercise: yes     Type of exercise: walking     Times/week: 1     Depression Counseling: not indicated; screening negative for depression     Drug Use: no     Seat Belt Use: 100     Sun Exposure: occasionally  Medications Prior to Update: 1)  Nabumetone 500 Mg  Tabs (Nabumetone) .... Take Tablet Every 12 Hours or When Needed 2)  Lexapro 20 Mg  Tabs (Escitalopram Oxalate) .... Take 1 Tablet By Mouth Once A Day 3)  Seroquel 200 Mg  Tabs (Quetiapine Fumarate) .... Take 1 Tab By Mouth At Bedtime(Per [facility]) 4)  Claritin 10 Mg  Tabs (Loratadine) .... Take 1 Tablet By Mouth Once A Day As Needed For Ear Itching 5)  Nystatin-Triamcinolone 100000-0.1 Unit/gm-% Oint (Nystatin-Triamcinolone) .... One Application Topically To Affected Area Two Times A Day Until Healed 6)  Voltaren 1 % Gel (Diclofenac Sodium) .... Apply 4gm To Knees Two Times A Day **pharmacy - D/c Flector Patch** 7)  Fluticasone Propionate 50 Mcg/act Susp (Fluticasone Propionate) .... One Spray in Each Nostril Two Times A Day  Allergies (verified): No Known Drug Allergies  Review of Systems CV:  Denies chest pain or  discomfort. Resp:  Denies cough. GI:  Denies vomiting and vomiting blood. MS:  Complains of joint pain; left knee .  Physical Exam  General:  alert.  obese Head:  normocephalic.   Lungs:  normal breath sounds.   Heart:  normal rate and regular rhythm.   Abdomen:  obese Msk:  up to the exam table Neurologic:  alert & oriented X3.     Knee Exam  Knee Exam:    Right:    Inspection:  Abnormal       Location:  patellar    Stability:  stable    Tenderness:  no    Swelling:  no    Erythema:  no   Impression & Recommendations:  Problem # 1:  ARTHRITIS, RIGHT KNEE (ICD-716.96) x-rays done advised to pt continue anti-inflammatory will start tramadol  Problem # 2:  HYPERTHYROIDISM (ICD-242.90) will call and get pt scheduled today  while she is here in the office  Complete Medication List: 1)  Nabumetone 500 Mg Tabs (Nabumetone) .... Take tablet every 12 hours or when needed 2)  Lexapro 20 Mg Tabs (Escitalopram oxalate) .... Take 1 tablet by mouth once a day 3)  Seroquel 200 Mg Tabs (quetiapine Fumarate)  .... Take 1 tab by mouth at bedtime(per [facility]) 4)  Claritin 10 Mg Tabs (Loratadine) .... Take 1 tablet by mouth once a day as needed for ear itching 5)  Nystatin-triamcinolone 100000-0.1 Unit/gm-% Oint (Nystatin-triamcinolone) .... One application topically to affected area two times a day until healed 6)  Voltaren 1 % Gel (Diclofenac sodium) .... Apply 4gm to knees two times a day **pharmacy - d/c flector patch** 7)  Fluticasone Propionate 50 Mcg/act Susp (Fluticasone propionate) .... One spray in each nostril two times a day 8)  Tramadol Hcl 50 Mg Tabs (Tramadol hcl) .... One tablet by mouth two times a day as needed for pain  Patient Instructions: 1)  Schedule fasting lab visit (morning appointment) 2)  will need cmp and lipid 3)  Fax lab results to [facility] Fredric Mare, NP (order sheet in lab) 4)  Your will be given a appointment with the specialist to check your thyroid.  Be sure to keep this appointment 5)  Knee pain - arthritis noted on x-ray. 6)  May continue to take nabumatome and tylenol. 7)  May also take tramadol (new prescription) as needed for pain 8)  You need a complete physical exam 9)  Schedule this on your next visit in 2 months 10)  will need mammogram, pap smear Prescriptions: TRAMADOL HCL 50 MG TABS (TRAMADOL HCL) One tablet by mouth two times a day as needed for pain  #50 x 0   Entered and Authorized by:   Lehman Prom FNP   Signed by:   Lehman Prom FNP on 06/21/2010   Method used:   Print then Give to Patient   RxID:   [id number]

## 2011-01-30 NOTE — Progress Notes (Signed)
Summary: REFILLS Tramadol  Phone Note Call from Patient   Reason for Call: Refill Medication Summary of Call: MARTIN PT. MS Ramser STOPPED BY BECAUSE SHE NEEDS A REFILL ON HER TRAMADO, AND LORATADINE. [facility] [address] Initial call taken by: Leodis Rains,  September 05, 2010 11:54 AM  Follow-up for Phone Call        sent to N. Daphine Deutscher.  Dutch Quint RN  September 05, 2010 12:02 PM   Additional Follow-up for Phone Call Additional follow up Details #1::        Rx printed - shiela to fax back pt can check at the pharmacy # listed is her mother's number so you may have to leave the info with her Additional Follow-up by: Lehman Prom FNP,  September 05, 2010 1:15 PM    Additional Follow-up for Phone Call Additional follow up Details #2::    Pt. notified.  Dutch Quint RN  September 05, 2010 2:25 PM   Prescriptions: CLARITIN 10 MG  TABS (LORATADINE) Take 1 tablet by mouth once a day as needed for ear itching  #30 Tablet x 5   Entered and Authorized by:   Lehman Prom FNP   Signed by:   Lehman Prom FNP on 09/05/2010   Method used:   Printed then faxed to ...       [facility]  [address] [phone]* (retail)       [address]       [place], [place]  [address]       Ph: [phone] or [phone]       Fax: [phone]   RxID:   [id number] TRAMADOL HCL 50 MG TABS (TRAMADOL HCL) One tablet by mouth two times a day as needed for pain  #50 x 0   Entered and Authorized by:   Lehman Prom FNP   Signed by:   Lehman Prom FNP on 09/05/2010   Method used:   Printed then faxed to ...       [facility]  [address] [phone]* (retail)       [address]       [place], [place]  [address]       Ph: [phone] or [phone]       Fax: [phone]   RxID:   [id number]

## 2011-01-30 NOTE — Letter (Signed)
Summary: [facility] /DR JEFFREY KERR  [facility] /DR JEFFREY KERR   Imported By: Arta Bruce 10/18/2010 11:49:14  _____________________________________________________________________  External Attachment:    Type:   Image     Comment:   External Document

## 2011-01-30 NOTE — Progress Notes (Signed)
Summary: NEEDS REFILLS  Phone Note Call from Patient Call back at Home Phone [phone]   Reason for Call: Refill Medication Summary of Call: Diane Sutton STOPPED BY AND SAYS THAT [facility] [address] NEEDS [place] TO CALL IN  AREFILL ON HER NABUENTONE AND CLARITIN TO THEM Initial call taken by: Leodis Rains,  February 02, 2010 3:24 PM  Follow-up for Phone Call        forward to N. Daphine Deutscher, FNP Follow-up by: Levon Hedger,  February 03, 2010 8:59 AM  Additional Follow-up for Phone Call Additional follow up Details #1::        notify pt that refills have been sent electronically she can check with them to see when they will be ready Additional Follow-up by: Lehman Prom FNP,  February 03, 2010 9:55 AM    Additional Follow-up for Phone Call Additional follow up Details #2::    pt informed. Follow-up by: Levon Hedger,  February 03, 2010 12:51 PM  Prescriptions: CLARITIN 10 MG  TABS (LORATADINE) Take 1 tablet by mouth once a day as needed for ear itching  #30 Tablet x 5   Entered and Authorized by:   Lehman Prom FNP   Signed by:   Lehman Prom FNP on 02/03/2010   Method used:   Electronically to        [facility]  [address] [phone]* (retail)       [address]       [place], [place]  [address]       Ph: [phone] or [phone]       Fax: [phone]   RxID:   [id number] NABUMETONE 500 MG  TABS (NABUMETONE) Take tablet every 12 hours or when needed  #60 x 5   Entered and Authorized by:   Lehman Prom FNP   Signed by:   Lehman Prom FNP on 02/03/2010   Method used:   Electronically to        [facility]  [address] [phone]* (retail)       [address]       [place], [place]  [address]       Ph: [phone] or [phone]       Fax: [phone]   RxID:   [id number]

## 2011-01-30 NOTE — Letter (Signed)
Summary: *Referral Letter  [facility]  [address] [place], [place] [address]   Phone: [phone]  Fax: [phone]    03/20/2010  Thank you in advance for agreeing to see my patient:  [facility] [address] [place], [place]  [address]  Phone: [phone]  Reason for Referral: Hyperthyroidism  Ms. Sachdeva is an established patient in this office.  Hyperthyroidism noted in labs dated back to 2008.  However, pt declined further workup at at that time.  During the later part of 2010, thyroid labs were again abnormal and she agreed to have a thyroid uptake scan which revealed a thyroid nodule.  She then had a thyroid biopsy on 01/23/2010 which showed a non-neoplastic goiter.  Patient is being referred to the specialist for further evaluation and to see if patient should start medication or what treatment options if any are recommended.  Current Medical Problems: 1)  Hx of PNEUMONIA (ICD-486) 2)  THYROID NODULE, RIGHT (ICD-241.0) 3)  CERVICAL RADICULOPATHY, LEFT (ICD-723.4) 4)  CHEST PAIN UNSPECIFIED (ICD-786.50) 5)  UTI (ICD-599.0) 6)  CANDIDIASIS, SKIN (ICD-112.3) 7)  POSTHERPETIC NEURALGIA (ICD-053.19) 8)  TINEA CRURIS (ICD-110.3) 9)  INGROWING NAIL (ICD-703.0) 10)  PRURITUS, EARS (ICD-698.9) 11)  OTALGIA (ICD-388.70) 12)  VAGINITIS, BACTERIAL (ICD-616.10) 13)  ARTHRITIS, RIGHT KNEE (ICD-716.96) 14)  OTHER ABNORMAL BLOOD CHEMISTRY (ICD-790.6) 15)  TINEA CORPORIS (ICD-110.5) 16)  SCHZPHRN, PARANOID, CHRONIC W/ACUTE EXACERAB (ICD-295.34) 17)  STRABISMUS (ICD-378.9) 18)  GOITER, MULTINODULAR (ICD-241.1) 19)  HYPERTHYROIDISM (ICD-242.90) 20)  MIGRAINE, CHRONIC (ICD-346.90) 21)  AUDITORY HALLUCINATION (ICD-780.1) 22)  DEPRESSION (ICD-311)   Current Medications: 1)  NABUMETONE 500 MG  TABS (NABUMETONE) Take tablet every 12 hours or when needed 2)  LEXAPRO 20 MG  TABS (ESCITALOPRAM OXALATE) Take 1 tablet by mouth once a day 3)  * SEROQUEL 200 MG   TABS (QUETIAPINE FUMARATE) Take 1 tab by mouth at bedtime(per [facility]) 4)  CLARITIN 10 MG  TABS (LORATADINE) Take 1 tablet by mouth once a day as needed for ear itching 5)  TRIAMCINOLONE ACETONIDE 0.1 % OINT (TRIAMCINOLONE ACETONIDE) One application topically two times a day as needed 6)  NYSTATIN-TRIAMCINOLONE 100000-0.1 UNIT/GM-% OINT (NYSTATIN-TRIAMCINOLONE) One application topically to affected area two times a day until healed 7)  VOLTAREN 1 % GEL (DICLOFENAC SODIUM) Apply 4gm to knees two times a day **Pharmacy - d/c flector patch**   Thank you again for agreeing to see our patient; please contact us if you have any further questions or need additional information.  Sincerely,    Lehman Prom FNP

## 2011-01-30 NOTE — Progress Notes (Signed)
Summary: Need endocrinology appt  Phone Note Outgoing Call   Summary of Call: Pt was seen in july by endocrinology  she had thyroid uptake scan and she was supposed to go back  in 3-4 weeks for results and f/u. can you call [facility] and schedule pt a f/u appt with Dr. Sharl Ma at [facility] Call pt with the time/date/ and address of the endocrinolgy appt when scheduled the number listed is her mother's number so you may have to leave a message.  Pt also said that she would come back up here today if necessary for info about the referral Initial call taken by: Lehman Prom FNP,  October 06, 2010 10:18 AM  Follow-up for Phone Call        I leave a message with her mom to return my call.Cheryll Dessert  October 06, 2010 10:22 AM Pt have an appt [facility] Sharl Ma  [address]  10-12-10 Thursaday @ 11;20AM  Waiting for pt to return my call .Marland KitchenCheryll Dessert  October 06, 2010 10:47 AM *LM with her mom again .Marland KitchenCheryll Dessert  October 09, 2010 12:32 PM      Additional Follow-up for Phone Call Additional follow up Details #1::        pt returned call and told her appt on the 13th of oct  Additional Follow-up by: Oscar La,  October 06, 2010 4:00 PM

## 2011-01-30 NOTE — Progress Notes (Signed)
Summary: Update  Phone Note Outgoing Call    New Problems: ARTHRITIS, RIGHT KNEE (ICD-716.96)   New Problems: ARTHRITIS, RIGHT KNEE (ICD-716.96)

## 2011-01-30 NOTE — Progress Notes (Signed)
Summary: [facility]  Phone Note From Other Clinic   Summary of Call: Babette Relic, from [facility] called in because they set up a thyroid biopsy for the pt above  on Jan 25 at 2:15 pm.   If you have any further questions, please call back to Tammy  [phone] [facility] FNP Initial call taken by: Manon Hilding,  January 17, 2010 4:03 PM  Follow-up for Phone Call        Graciela please call pt and let her know she is scheduled for this appt.Marland KitchenMarland KitchenMarland KitchenThanks Regina Follow-up by: Mikey College CMA,  January 20, 2010 12:28 PM  Additional Follow-up for Phone Call Additional follow up Details #1::        Pt aware of the message.Manon Hilding  January 24, 2010 10:34 AM

## 2011-01-30 NOTE — Assessment & Plan Note (Signed)
Summary: F/u Pneumonia   Vital Signs:  Patient profile:   55 year old female Menstrual status:  perimenopausal Weight:      272.9 pounds BMI:     43.87 BSA:     2.29 O2 Sat:      97 % on Room air Temp:     97.4 degrees F oral Pulse rate:   84 / minute Pulse rhythm:   regular Resp:     20 per minute BP sitting:   133 / 85  (left arm) Cuff size:   large  Vitals Entered By: Levon Hedger (March 17, 2010 2:49 PM)  O2 Flow:  Room air CC: Depression Is Patient Diabetic? No  Does patient need assistance? Functional Status Self care Ambulation Normal   Primary Care Provider:  Lehman Prom FNP  CC:  Depression.  History of Present Illness:  Pt into the office to f/u on pneumonia. (See full notes on prevoius visit) Pt was Dx in the hospital but presented here for f/u and cough persisted. She was treated on last week with avelox and she has finished all the meds. Cough still persists with slight discomfort in right flank.  she is alternating nabumetome with tylenol for pain with good results.  She is also taking OTC cough meds   Hyperthyroid - long history.  Pt recently had a thyroid biopsy on 01/24/2010 which showed findings consistent with non-neoplastic goiter.  Pt is not on any thyroid medications. The patient denies that she feels like life is not worth living, denies that she wishes that she were dead, and denies that she has thought about ending her life.        Comments:  maintains routine f/u at the [facility].   Allergies (verified): No Known Drug Allergies  Review of Systems General:  Denies fatigue. CV:  Denies palpitations. Resp:  Complains of cough. GI:  Denies diarrhea.  Physical Exam  General:  alert.   Head:  normocephalic.   Lungs:  normal breath sounds.   Heart:  normal rate and regular rhythm.   Msk:  up to the exam table Neurologic:  alert & oriented X3.   Skin:  color normal.   Psych:  Oriented X3.     Impression &  Recommendations:  Problem # 1:  Hx of PNEUMONIA (ICD-486)  pt is doing well  advised pt that cough may persists for a little while longer but she can continue with OTC cough meds and as needed pain meds.  Her updated medication list for this problem includes:    Avelox 400 Mg Tabs (Moxifloxacin hcl) ..... One tablet by mouth daily for infection  Orders: Pulse Oximetry (single measurment) (94760)  Problem # 2:  THYROID NODULE, RIGHT (ICD-241.0)  thyroid biopsy consistent with non-neoplastic goiter  Orders: Endocrinology Referral (Endocrine)  Problem # 3:  HYPERTHYROIDISM (ICD-242.90) persists.  will refer to endocrinology Orders: T-TSH [id number]) T-T3, Free [id number]) T-Free T4 (23300) Endocrinology Referral (Endocrine)  Complete Medication List: 1)  Nabumetone 500 Mg Tabs (Nabumetone) .... Take tablet every 12 hours or when needed 2)  Lexapro 20 Mg Tabs (Escitalopram oxalate) .... Take 1 tablet by mouth once a day 3)  Seroquel 200 Mg Tabs (quetiapine Fumarate)  .... Take 1 tab by mouth at bedtime(per [facility]) 4)  Claritin 10 Mg Tabs (Loratadine) .... Take 1 tablet by mouth once a day as needed for ear itching 5)  Triamcinolone Acetonide 0.1 % Oint (Triamcinolone acetonide) .... One application topically two times a day  as needed 6)  Nystatin-triamcinolone 100000-0.1 Unit/gm-% Oint (Nystatin-triamcinolone) .... One application topically to affected area two times a day until healed 7)  Voltaren 1 % Gel (Diclofenac sodium) .... Apply 4gm to knees two times a day **pharmacy - d/c flector patch** 8)  Avelox 400 Mg Tabs (Moxifloxacin hcl) .... One tablet by mouth daily for infection  Patient Instructions: 1)  Lungs sound much better today. 2)  You can continue to take the nabumetome alternating with tylenol for pain. 3)  Your thyroid will be checked today and you will be notifed of the results. 4)  You may need to see a thyroid specialist if your labs are still abnormal.  You  will be informed if this is necessary 5)  Folllow up in June 2011 or sooner if necessary

## 2011-01-30 NOTE — Progress Notes (Signed)
Summary: Endocrinology referral  Phone Note Outgoing Call   Summary of Call: Referral has been ordered to endocrinology please attach recent thyroid biopsy results, thyroid uptake scan and flowsheet of thyroid labs with referral Initial call taken by: Lehman Prom FNP,  March 20, 2010 10:13 AM

## 2011-02-05 ENCOUNTER — Encounter (INDEPENDENT_AMBULATORY_CARE_PROVIDER_SITE_OTHER): Payer: Self-pay | Admitting: Nurse Practitioner

## 2011-02-05 ENCOUNTER — Encounter: Payer: Self-pay | Admitting: Nurse Practitioner

## 2011-02-05 DIAGNOSIS — F200 Paranoid schizophrenia: Secondary | ICD-10-CM | POA: Insufficient documentation

## 2011-02-13 ENCOUNTER — Encounter (INDEPENDENT_AMBULATORY_CARE_PROVIDER_SITE_OTHER): Payer: Self-pay | Admitting: Nurse Practitioner

## 2011-02-13 LAB — CONVERTED CEMR LAB
CO2: 25 meq/L (ref 19–32)
Glucose, Bld: 90 mg/dL (ref 70–99)
HCT: 40.1 % (ref 36.0–46.0)
Hgb A1c MFr Bld: 5.7 % — ABNORMAL HIGH (ref <5.7)
LDL Cholesterol: 120 mg/dL — ABNORMAL HIGH (ref 0–99)
Lymphocytes Relative: 43 % (ref 12–46)
Lymphs Abs: 1.8 10*3/uL (ref 0.7–4.0)
Neutrophils Relative %: 38 % — ABNORMAL LOW (ref 43–77)
Platelets: 284 10*3/uL (ref 150–400)
Potassium: 3.9 meq/L (ref 3.5–5.3)
RBC: 4.3 M/uL (ref 3.87–5.11)
Sodium: 140 meq/L (ref 135–145)
Total CHOL/HDL Ratio: 3.9
VLDL: 23 mg/dL (ref 0–40)
WBC: 4.2 10*3/uL (ref 4.0–10.5)

## 2011-02-15 NOTE — Assessment & Plan Note (Signed)
Summary: Wants to discuss illness   Vital Signs:  Patient profile:   55 year old female Menstrual status:  perimenopausal Weight:      269.1 pounds BMI:     43.26 Temp:     97.4 degrees F oral Pulse rate:   80 / minute Pulse rhythm:   regular Resp:     20 per minute BP sitting:   130 / 90  (left arm) Cuff size:   large  Vitals Entered By: Levon Hedger (February 05, 2011 4:06 PM)  Nutrition Counseling: Patient's BMI is greater than 25 and therefore counseled on weight management options. CC: wants to see about having surgery for her head, Headache, Depression Is Patient Diabetic? No Pain Assessment Patient in pain? no       Does patient need assistance? Functional Status Self care Ambulation Normal   Primary Care Provider:  Lehman Prom FNP  CC:  wants to see about having surgery for her head, Headache, and Depression.  History of Present Illness:  Pt into the office with questions about her career. She was participating in a skills enhancement center until 2 weeks ago and she had to stop going. 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 She knows that she has to be deliberate in her work habits now.  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  Pt is asking if she can have a surgery for her head to improve her defect  Pt presents today with all her medications, which she takes daily.  Pt has been to the Endocrinologist as ordered Comments:  Pt is going to f/u [facility].    Allergies (verified): No Known Drug Allergies  Review of Systems General:  Denies loss of appetite. CV:  Denies chest pain or discomfort. Resp:  Denies cough. GI:  Denies abdominal pain, nausea, and vomiting.  Physical Exam  General:  alert.   Head:  normocephalic.   Neck:  thyromegaly.   Neurologic:  alert & oriented X3.   Skin:  color normal.   Psych:  Oriented X3.     Impression & Recommendations:  Problem  # 1:  SCHZPHRN, PARANOID, CHRONIC W/ACUTE EXACERAB (ICD-295.34) ongoing f/u and pt goes to the [facility] center reviewed that pt she may not be able to do the job she preformed before but she may still be able to perform some job will try to see if pt can go through vocational rehab - she can go to social services and see if she qualifies for this program  Problem # 2:  AUDITORY HALLUCINATION (ICD-780.1) ongoing  Complete Medication List: 1)  Nabumetone 500 Mg Tabs (Nabumetone) .... Take tablet every 12 hours or when needed 2)  Lexapro 20 Mg Tabs (Escitalopram oxalate) .... Take 1 tablet by mouth once a day 3)  Seroquel 200 Mg Tabs (quetiapine Fumarate)  .... Take 1 tab by mouth at bedtime(per [facility]) 4)  Claritin 10 Mg Tabs (Loratadine) .... Take 1 tablet by mouth once a day as needed for ear itching 5)  Hydrocortisone 2.5 % Oint (Hydrocortisone) .... One application topically two times a day as needed for rash 6)  Fluticasone Propionate 50 Mcg/act Susp (Fluticasone propionate) .... One spray in each nostril two times a day 7)  Tramadol Hcl 50 Mg Tabs (Tramadol hcl) .... One tablet by mouth two times a day as needed for pain  Patient Instructions: 1)  You should go to [facility]  and ask for the Vocational Rehab. 2)  Tell them you are interested in the [facility] program   Orders Added: 1)  Est. Patient Level III [16109]

## 2011-03-23 ENCOUNTER — Emergency Department (HOSPITAL_COMMUNITY)
Admission: EM | Admit: 2011-03-23 | Discharge: 2011-03-23 | Discharge status: Against Medical Advice | Payer: Medicaid Other | Attending: Emergency Medicine | Admitting: Emergency Medicine

## 2011-03-23 DIAGNOSIS — R51 Headache: Secondary | ICD-10-CM | POA: Insufficient documentation

## 2011-03-23 LAB — DIFFERENTIAL
Basophils Absolute: 0 10*3/uL (ref 0.0–0.1)
Basophils Relative: 0 % (ref 0–1)
Basophils Relative: 1 % (ref 0–1)
Eosinophils Absolute: 0.1 10*3/uL (ref 0.0–0.7)
Eosinophils Absolute: 0.2 10*3/uL (ref 0.0–0.7)
Eosinophils Relative: 2 % (ref 0–5)
Lymphs Abs: 1.9 10*3/uL (ref 0.7–4.0)
Monocytes Absolute: 0.6 10*3/uL (ref 0.1–1.0)
Monocytes Relative: 11 % (ref 3–12)
Monocytes Relative: 6 % (ref 3–12)
Neutro Abs: 2.8 10*3/uL (ref 1.7–7.7)
Neutro Abs: 8.7 10*3/uL — ABNORMAL HIGH (ref 1.7–7.7)
Neutrophils Relative %: 52 % (ref 43–77)

## 2011-03-23 LAB — CBC
HCT: 38.3 % (ref 36.0–46.0)
Hemoglobin: 12.8 g/dL (ref 12.0–15.0)
Hemoglobin: 13 g/dL (ref 12.0–15.0)
MCHC: 33.4 g/dL (ref 30.0–36.0)
MCHC: 33.5 g/dL (ref 30.0–36.0)
MCV: 94.2 fL (ref 78.0–100.0)
MCV: 95 fL (ref 78.0–100.0)
RBC: 4.06 MIL/uL (ref 3.87–5.11)
RBC: 4.07 MIL/uL (ref 3.87–5.11)
RDW: 13.5 % (ref 11.5–15.5)
RDW: 14 % (ref 11.5–15.5)

## 2011-03-23 LAB — BASIC METABOLIC PANEL
CO2: 27 mEq/L (ref 19–32)
Calcium: 8.8 mg/dL (ref 8.4–10.5)
Chloride: 101 mEq/L (ref 96–112)
Glucose, Bld: 110 mg/dL — ABNORMAL HIGH (ref 70–99)
Sodium: 134 mEq/L — ABNORMAL LOW (ref 135–145)

## 2011-03-23 LAB — URINALYSIS, ROUTINE W REFLEX MICROSCOPIC
Ketones, ur: NEGATIVE mg/dL
Ketones, ur: NEGATIVE mg/dL
Leukocytes, UA: NEGATIVE
Nitrite: NEGATIVE
Nitrite: POSITIVE — AB
Protein, ur: NEGATIVE mg/dL
Protein, ur: NEGATIVE mg/dL
Urobilinogen, UA: 1 mg/dL (ref 0.0–1.0)
pH: 6 (ref 5.0–8.0)

## 2011-03-23 LAB — COMPREHENSIVE METABOLIC PANEL
ALT: 49 U/L — ABNORMAL HIGH (ref 0–35)
BUN: 10 mg/dL (ref 6–23)
CO2: 27 mEq/L (ref 19–32)
Calcium: 8.7 mg/dL (ref 8.4–10.5)
Creatinine, Ser: 0.7 mg/dL (ref 0.4–1.2)
GFR calc non Af Amer: >60 mL/min (ref >60)
Glucose, Bld: 108 mg/dL — ABNORMAL HIGH (ref 70–99)
Sodium: 137 mEq/L (ref 135–145)
Total Protein: 8.1 g/dL (ref 6.0–8.3)

## 2011-03-23 LAB — URINE MICROSCOPIC-ADD ON

## 2011-03-23 LAB — POCT CARDIAC MARKERS: CKMB, poc: <1 ng/mL — ABNORMAL LOW (ref 1.0–8.0)

## 2011-03-23 LAB — LIPASE, BLOOD: Lipase: 20 U/L (ref 11–59)

## 2011-03-26 ENCOUNTER — Telehealth (INDEPENDENT_AMBULATORY_CARE_PROVIDER_SITE_OTHER): Payer: Self-pay | Admitting: Nurse Practitioner

## 2011-03-28 ENCOUNTER — Encounter (INDEPENDENT_AMBULATORY_CARE_PROVIDER_SITE_OTHER): Payer: Self-pay | Admitting: Nurse Practitioner

## 2011-04-03 NOTE — Progress Notes (Signed)
Summary: Need neurology referral  Phone Note Call from Patient   Summary of Call: pt dropped off correspondence from dr.marica bijelac//in you refill slot Initial call taken by: Arta Bruce,  March 26, 2011 10:46 AM  Follow-up for Phone Call        Reviewed -note indicates that pt needs labs however the same labs were just done 02/13/2011. will send results to [facility] in case they did not get before.  Medicaid is NOT going to cover repeating labs after just 1 month. There is also mention of brief LOC (though not witnessed) and passing out for several minutes no loss of bowel or bladder.  ? absence seizures and need for EEG.  will need to refer to neurology for further eval Follow-up by: Lehman Prom FNP,  March 28, 2011 10:55 AM  Additional Follow-up for Phone Call Additional follow up Details #1::        Referral faxed to [facility] on 03-30-11.Neuro will contact Pt with appt date & time Additional Follow-up by: Candi Leash,  March 30, 2011 4:09 PM  New Problems: R/O ABSENCE SEIZURE (ICD-345.00)   New Problems: R/O ABSENCE SEIZURE (ICD-345.00)

## 2011-04-03 NOTE — Letter (Signed)
Summary: CORRESPONDENCE TO DR.MARICA BIJELAZ  CORRESPONDENCE TO DR.MARICA BIJELAZ   Imported By: Arta Bruce 03/28/2011 13:41:53  _____________________________________________________________________  External Attachment:    Type:   Image     Comment:   External Document

## 2011-04-06 LAB — URINALYSIS, ROUTINE W REFLEX MICROSCOPIC
Ketones, ur: NEGATIVE mg/dL
Leukocytes, UA: NEGATIVE
Protein, ur: NEGATIVE mg/dL
Urobilinogen, UA: 1 mg/dL (ref 0.0–1.0)

## 2011-04-06 LAB — CBC
HCT: 34.5 % — ABNORMAL LOW (ref 36.0–46.0)
HCT: 38.2 % (ref 36.0–46.0)
Hemoglobin: 11.6 g/dL — ABNORMAL LOW (ref 12.0–15.0)
MCV: 94.6 fL (ref 78.0–100.0)
Platelets: 235 10*3/uL (ref 150–400)
Platelets: 255 10*3/uL (ref 150–400)
RDW: 13.3 % (ref 11.5–15.5)
RDW: 14 % (ref 11.5–15.5)
WBC: 8.3 10*3/uL (ref 4.0–10.5)

## 2011-04-06 LAB — URINE MICROSCOPIC-ADD ON

## 2011-04-06 LAB — COMPREHENSIVE METABOLIC PANEL
AST: 32 U/L (ref 0–37)
Albumin: 3.2 g/dL — ABNORMAL LOW (ref 3.5–5.2)
BUN: 17 mg/dL (ref 6–23)
Calcium: 8.5 mg/dL (ref 8.4–10.5)
Creatinine, Ser: 0.68 mg/dL (ref 0.4–1.2)
GFR calc Af Amer: >60 mL/min (ref >60)
Total Protein: 7.3 g/dL (ref 6.0–8.3)

## 2011-04-06 LAB — DIFFERENTIAL
Basophils Relative: 0 % (ref 0–1)
Eosinophils Absolute: 0.3 10*3/uL (ref 0.0–0.7)
Eosinophils Relative: 2 % (ref 0–5)
Lymphocytes Relative: 5 % — ABNORMAL LOW (ref 12–46)
Lymphs Abs: 0.7 10*3/uL (ref 0.7–4.0)
Neutro Abs: 13.3 10*3/uL — ABNORMAL HIGH (ref 1.7–7.7)

## 2011-04-06 LAB — URINE CULTURE: Colony Count: >=100000

## 2011-04-07 LAB — URINALYSIS, ROUTINE W REFLEX MICROSCOPIC
Bilirubin Urine: NEGATIVE
Glucose, UA: NEGATIVE mg/dL
Glucose, UA: NEGATIVE mg/dL
Hgb urine dipstick: NEGATIVE
Ketones, ur: 15 mg/dL — AB
Ketones, ur: NEGATIVE mg/dL
Nitrite: NEGATIVE
Nitrite: POSITIVE — AB
Specific Gravity, Urine: 1.02 (ref 1.005–1.030)
Specific Gravity, Urine: 1.024 (ref 1.005–1.030)
pH: 5.5 (ref 5.0–8.0)
pH: 6 (ref 5.0–8.0)

## 2011-04-07 LAB — DIFFERENTIAL
Basophils Absolute: 0 10*3/uL (ref 0.0–0.1)
Eosinophils Relative: 4 % (ref 0–5)
Eosinophils Relative: 4 % (ref 0–5)
Lymphocytes Relative: 36 % (ref 12–46)
Lymphocytes Relative: 38 % (ref 12–46)
Lymphs Abs: 1.7 10*3/uL (ref 0.7–4.0)
Lymphs Abs: 1.9 10*3/uL (ref 0.7–4.0)
Monocytes Absolute: 0.4 10*3/uL (ref 0.1–1.0)
Monocytes Absolute: 0.5 10*3/uL (ref 0.1–1.0)
Monocytes Relative: 11 % (ref 3–12)
Neutro Abs: 2.3 10*3/uL (ref 1.7–7.7)

## 2011-04-07 LAB — CBC
HCT: 37.6 % (ref 36.0–46.0)
HCT: 39.8 % (ref 36.0–46.0)
Hemoglobin: 12.8 g/dL (ref 12.0–15.0)
Hemoglobin: 13.4 g/dL (ref 12.0–15.0)
RBC: 3.98 MIL/uL (ref 3.87–5.11)
RBC: 4.21 MIL/uL (ref 3.87–5.11)
RDW: 13.7 % (ref 11.5–15.5)
WBC: 4.8 10*3/uL (ref 4.0–10.5)
WBC: 4.8 10*3/uL (ref 4.0–10.5)

## 2011-04-07 LAB — BASIC METABOLIC PANEL
Calcium: 8.4 mg/dL (ref 8.4–10.5)
GFR calc Af Amer: >60 mL/min (ref >60)
GFR calc non Af Amer: >60 mL/min (ref >60)
Glucose, Bld: 106 mg/dL — ABNORMAL HIGH (ref 70–99)
Potassium: 3.6 mEq/L (ref 3.5–5.1)
Sodium: 140 mEq/L (ref 135–145)

## 2011-04-07 LAB — POCT CARDIAC MARKERS
CKMB, poc: <1 ng/mL — ABNORMAL LOW (ref 1.0–8.0)
Troponin i, poc: <0.05 ng/mL (ref 0.00–0.09)
Troponin i, poc: <0.05 ng/mL (ref 0.00–0.09)

## 2011-04-07 LAB — COMPREHENSIVE METABOLIC PANEL
AST: 22 U/L (ref 0–37)
Albumin: 3.7 g/dL (ref 3.5–5.2)
CO2: 23 mEq/L (ref 19–32)
Calcium: 8.5 mg/dL (ref 8.4–10.5)
Chloride: 108 mEq/L (ref 96–112)
Creatinine, Ser: 0.58 mg/dL (ref 0.4–1.2)
Glucose, Bld: 84 mg/dL (ref 70–99)
Potassium: 3.8 mEq/L (ref 3.5–5.1)
Sodium: 138 mEq/L (ref 135–145)

## 2011-04-07 LAB — LIPASE, BLOOD: Lipase: 17 U/L (ref 11–59)

## 2011-04-07 LAB — POCT PREGNANCY, URINE: Preg Test, Ur: NEGATIVE

## 2011-04-07 LAB — URINE MICROSCOPIC-ADD ON

## 2011-04-08 LAB — POCT CARDIAC MARKERS: Troponin i, poc: <0.05 ng/mL (ref 0.00–0.09)

## 2011-04-08 LAB — POCT I-STAT, CHEM 8
BUN: 16 mg/dL (ref 6–23)
Calcium, Ion: 1.15 mmol/L (ref 1.12–1.32)
Creatinine, Ser: 0.7 mg/dL (ref 0.4–1.2)
Glucose, Bld: 93 mg/dL (ref 70–99)
TCO2: 23 mmol/L (ref 0–100)

## 2011-04-08 LAB — CBC
HCT: 38.2 % (ref 36.0–46.0)
Hemoglobin: 12.9 g/dL (ref 12.0–15.0)
RBC: 4.1 MIL/uL (ref 3.87–5.11)
RDW: 13.4 % (ref 11.5–15.5)

## 2011-04-08 LAB — DIFFERENTIAL
Basophils Absolute: 0 10*3/uL (ref 0.0–0.1)
Basophils Relative: 1 % (ref 0–1)
Lymphs Abs: 1.3 10*3/uL (ref 0.7–4.0)
Monocytes Relative: 11 % (ref 3–12)
Neutro Abs: 2.2 10*3/uL (ref 1.7–7.7)

## 2011-04-20 ENCOUNTER — Other Ambulatory Visit (HOSPITAL_COMMUNITY): Payer: Self-pay | Admitting: Internal Medicine

## 2011-04-20 ENCOUNTER — Other Ambulatory Visit (HOSPITAL_COMMUNITY)
Admission: RE | Admit: 2011-04-20 | Discharge: 2011-04-20 | Disposition: A | Discharge status: Home / Self Care | Payer: Medicaid Other | Source: Ambulatory Visit | Attending: Family Medicine | Admitting: Family Medicine

## 2011-04-20 ENCOUNTER — Other Ambulatory Visit: Payer: Self-pay | Admitting: Nurse Practitioner

## 2011-04-20 DIAGNOSIS — Z1231 Encounter for screening mammogram for malignant neoplasm of breast: Secondary | ICD-10-CM

## 2011-04-20 DIAGNOSIS — Z01419 Encounter for gynecological examination (general) (routine) without abnormal findings: Secondary | ICD-10-CM | POA: Insufficient documentation

## 2011-04-26 ENCOUNTER — Ambulatory Visit (HOSPITAL_COMMUNITY): Payer: Medicaid Other

## 2011-05-03 ENCOUNTER — Ambulatory Visit (HOSPITAL_COMMUNITY)
Admission: RE | Admit: 2011-05-03 | Discharge: 2011-05-03 | Disposition: A | Discharge status: Home / Self Care | Payer: Medicaid Other | Source: Ambulatory Visit | Attending: Internal Medicine | Admitting: Internal Medicine

## 2011-05-03 DIAGNOSIS — Z1231 Encounter for screening mammogram for malignant neoplasm of breast: Secondary | ICD-10-CM

## 2011-05-18 NOTE — Op Note (Signed)
NAME:  Diane Sutton, Diane Sutton             ACCOUNT NO.:  [id number]   MEDICAL RECORD NO.:  [id number]          PATIENT TYPE:  AMB   LOCATION:  SDS                          FACILITY:  [facility]   PHYSICIAN:  Salley Scarlet., M.D.DATE OF BIRTH:  [birth year]   DATE OF PROCEDURE:  DATE OF DISCHARGE:  12/11/2007                               OPERATIVE REPORT   PREOPERATIVE DIAGNOSES:  1. 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  2. Exotropia.   POSTOPERATIVE DIAGNOSES:  1. 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  2. Exotropia.   OPERATION:  Strabismus repair with a lateral rectus recession of 7 mm,  left eye, and medial rectus resection, 6 mm, left eye.   SURGEON:  Nadyne Coombes, M.D.   ANESTHESIA:  General.   JUSTIFICATION FOR PROCEDURE:  This is a 55 year old lady who for some  time has had complaints of double vision.  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  A workup has shown no cause for the  problem.  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  We explained to her that  even though the strabismus was corrected, she still might have some  double vision.  She requested that the surgery be done; therefore, she  is admitted at this time for strabismus repair, left eye, under general  inhalation anesthesia.   PROCEDURE:  Under the influence of general inhalation anesthesia, the  patient was prepped and draped in the usual manner.  The lid speculum  inserted under the upper and lower lid of the left eye.  A 4-0 silk  traction suture was passed through the conjunctiva inferiorly at the 6  o'clock position for traction.  An incision was made in the conjunctiva  at the limbus anterior to the insertion of the lateral rectus muscle.  Then using sharp and blunt dissection, the lateral rectus muscle was  then carefully exposed and thoroughly cleansed Tenon's and check  ligaments.   Hemostasis was achieved by using cautery.  A double-armed 5-  0 Vicryl suture was then passed through the muscle lattice insertion in  such a fashion as to enclose all the muscle fibers within the double-arm  suture.  The muscle was then carefully dissected away from the sclera at  its insertion.  A distance of 7 mm posterior to this insertion was  measured using a caliper.  Each of the double-armed 5-0 Vicryl suture  was then passed through the sclera at this 5-mm distance.  The 2 arms  were together securely.  After ascertaining that the muscles was secured  in its new position, the conjunctiva was closed over the muscle using 7-  0 Vicryl.  The medial rectus muscle was exposed in a similar fashion.  After isolating the muscle, hemostasis was achieved using cautery.  At a  distance of 6 mm posterior to the insertion, a double-arm 5-0 Vicryl  suture was passed through the muscle so as to enclose all the muscle  fibers within the double-armed suture.  Then just anterior to this  suture, the muscle was cut.  The remaining muscle stump was carefully  dissected away from the sclera at this original insertion.  Each arm of  the double-arm suture was then passed through the sclera at the  insertion site and the 2 arms of the suture was tied together securely.  After ascertaining that the muscles was secured in its new position, the  conjunctiva was closed over the muscle using 7-0 Vicryl.  Maxitrol  ophthalmic ointment and a pressure patch was applied.  The patient  tolerated the procedure well and was discharged to the postanesthesia  recovery room in satisfactory condition.  She is instructed to rest  today, to take Vicodin every 4 hours as needed for pain and to see me in  office tomorrow for further evaluation.   DISCHARGE DIAGNOSIS:  Exotropia.      Salley Scarlet., M.D.  Electronically Signed     TB/MEDQ  D:  12/13/2007  T:  12/15/2007  Job:  [id number]

## 2011-05-18 NOTE — Op Note (Signed)
   NAMEMYRLENE, Diane Sutton                         ACCOUNT NO.:  [id number]   MEDICAL RECORD NO.:  [id number]                   PATIENT TYPE:  AMB   LOCATION:  DSC                                  FACILITY:  [facility]   PHYSICIAN:  Cindee Salt, M.D.                    DATE OF BIRTH:  [birth year]   DATE OF PROCEDURE:  DATE OF DISCHARGE:                                 OPERATIVE REPORT   PREOPERATIVE DIAGNOSIS:  Paronychial left middle finger.   POSTOPERATIVE DIAGNOSIS:  Paronychial left middle finger.   PROCEDURE:  Incision and drainage of paronychial left middle finger.   SURGEON:  Cindee Salt, M.D.   ASSISTANTCarolyne Fiscal   ANESTHESIA:  Metacarpal block.   HISTORY:  The patient is a 55 year old female with a history of paronychia.  She is referred by Dr. Lorelee New from [facility].   DESCRIPTION OF PROCEDURE:  The patient is brought to the minor room.  A  metacarpal block given with 1% Xylocaine without epinephrine, prepped using  duraprep in the supine position, left arm free.  A Penrose drain was used  for tourniquet control.  At the base of the finger an incision was made.  Purulent material was immediately encountered.  This involved the entire  dorsal fold of the nail which had essentially liquefied.  Cultures were  taken both aerobic and anaerobic cultures.  The area was debrided.  No  further drainage was encountered.  The area was packed.  A sterile  compressive dressing applied.  The patient tolerated the procedure well.  She is discharged on Vicodin and Keflex.                                               Cindee Salt, M.D.    [name]  D:  06/02/2003  T:  06/02/2003  Job:  [id number]

## 2011-06-25 ENCOUNTER — Other Ambulatory Visit: Payer: Self-pay | Admitting: Neurology

## 2011-06-25 DIAGNOSIS — R569 Unspecified convulsions: Secondary | ICD-10-CM

## 2011-07-07 ENCOUNTER — Other Ambulatory Visit: Payer: Medicaid Other

## 2011-08-03 ENCOUNTER — Other Ambulatory Visit: Payer: Medicaid Other

## 2011-08-29 ENCOUNTER — Other Ambulatory Visit: Payer: Self-pay | Admitting: Neurology

## 2011-08-29 DIAGNOSIS — R569 Unspecified convulsions: Secondary | ICD-10-CM

## 2011-09-06 ENCOUNTER — Other Ambulatory Visit: Payer: Medicaid Other

## 2011-09-12 ENCOUNTER — Other Ambulatory Visit: Payer: Medicaid Other

## 2011-10-08 LAB — BASIC METABOLIC PANEL
CO2: 26
Chloride: 106
GFR calc Af Amer: >60
Potassium: 4.3
Sodium: 138

## 2011-10-08 LAB — CBC
HCT: 36.2
Hemoglobin: 12.1
MCHC: 33.5
MCV: 92.2
RBC: 3.92
WBC: 5.9

## 2011-10-08 LAB — URINALYSIS, ROUTINE W REFLEX MICROSCOPIC
Glucose, UA: NEGATIVE
Hgb urine dipstick: NEGATIVE
Ketones, ur: NEGATIVE
Protein, ur: NEGATIVE
pH: 6.5

## 2011-10-17 LAB — DIFFERENTIAL
Eosinophils Relative: 3
Lymphocytes Relative: 44
Monocytes Absolute: 0.6
Monocytes Relative: 10
Neutro Abs: 2.6

## 2011-10-17 LAB — URINALYSIS, ROUTINE W REFLEX MICROSCOPIC
Bilirubin Urine: NEGATIVE
Glucose, UA: NEGATIVE
Hgb urine dipstick: NEGATIVE
Ketones, ur: NEGATIVE
Nitrite: NEGATIVE
Protein, ur: NEGATIVE
Specific Gravity, Urine: 1.028
Urobilinogen, UA: 1
pH: 5.5

## 2011-10-17 LAB — POCT I-STAT CREATININE
Creatinine, Ser: 0.7
Operator id: 192351

## 2011-10-17 LAB — I-STAT 8, (EC8 V) (CONVERTED LAB)
BUN: 19
Bicarbonate: 28.4 — ABNORMAL HIGH
Chloride: 107
Glucose, Bld: 90
HCT: 44
Hemoglobin: 15
Operator id: 192351
Potassium: 4.1
Sodium: 140
TCO2: 30
pCO2, Ven: 59.5 — ABNORMAL HIGH
pH, Ven: 7.287

## 2011-10-17 LAB — CBC
HCT: 38.4
Hemoglobin: 12.7
MCHC: 33
MCV: 93.3
Platelets: 313
RBC: 4.12
RDW: 13.1
WBC: 6

## 2012-03-07 ENCOUNTER — Emergency Department (HOSPITAL_COMMUNITY): Payer: Medicaid Other

## 2012-03-07 ENCOUNTER — Encounter (HOSPITAL_COMMUNITY): Payer: Self-pay | Admitting: Emergency Medicine

## 2012-03-07 ENCOUNTER — Emergency Department (HOSPITAL_COMMUNITY)
Admission: EM | Admit: 2012-03-07 | Discharge: 2012-03-07 | Disposition: A | Discharge status: Home Health | Payer: Medicaid Other | Attending: Emergency Medicine | Admitting: Emergency Medicine

## 2012-03-07 DIAGNOSIS — Z79899 Other long term (current) drug therapy: Secondary | ICD-10-CM | POA: Insufficient documentation

## 2012-03-07 DIAGNOSIS — M25561 Pain in right knee: Secondary | ICD-10-CM

## 2012-03-07 DIAGNOSIS — K089 Disorder of teeth and supporting structures, unspecified: Secondary | ICD-10-CM | POA: Insufficient documentation

## 2012-03-07 DIAGNOSIS — Z21 Asymptomatic human immunodeficiency virus [HIV] infection status: Secondary | ICD-10-CM | POA: Insufficient documentation

## 2012-03-07 DIAGNOSIS — M545 Low back pain, unspecified: Secondary | ICD-10-CM | POA: Insufficient documentation

## 2012-03-07 DIAGNOSIS — B20 Human immunodeficiency virus [HIV] disease: Secondary | ICD-10-CM | POA: Insufficient documentation

## 2012-03-07 DIAGNOSIS — M25569 Pain in unspecified knee: Secondary | ICD-10-CM | POA: Insufficient documentation

## 2012-03-07 HISTORY — DX: Human immunodeficiency virus (HIV) disease: B20

## 2012-03-07 HISTORY — DX: Asymptomatic human immunodeficiency virus (hiv) infection status: Z21

## 2012-03-07 MED ORDER — OXYCODONE-ACETAMINOPHEN 5-325 MG PO TABS: 1.0000 | ORAL_TABLET | Freq: Four times a day (QID) | ORAL | Status: AC | PRN

## 2012-03-07 NOTE — ED Notes (Signed)
Pt d/c home in NAD. Pt voiced understanding of d/c instructions.  

## 2012-03-07 NOTE — Discharge Instructions (Signed)
Return to the ED with any concerns including worsening pain, swelling of your legs, weakness of her lower extremities, not able to urinate, loss of control of your bowel or bladder, fever, or any other alarming symptoms.

## 2012-03-07 NOTE — ED Provider Notes (Signed)
History     CSN: [id number]  Arrival date & time 03/07/12  1322   First MD Initiated Contact with Patient 03/07/12 1554      Chief Complaint  Patient presents with  . Dental Pain  . Back Pain    (Consider location/radiation/quality/duration/timing/severity/associated sxs/prior treatment) HPI Patient presents with complaint of pain in her right knee and low back after twisting her knee and slipping on a bus approximately 2 weeks ago. She states the pain in her back as off and on and she is not having back pain currently. She does have pain over her kneecap. She's been able to walk without difficulty. She also complains of having been told by her dentist that she needs treatment of her gums. The she has talked with her primary doctor about this and will work with her primary doctor about any problems that she's having with her gums. This does not appear to be an acute issue and has been going on for several weeks. Movement and palpation make the pain in her knee worse. There no other associated systemic symptoms.  Past Medical History  Diagnosis Date  . HIV (human immunodeficiency virus infection)     History reviewed. No pertinent past surgical history.  History reviewed. No pertinent family history.  History  Substance Use Topics  . Smoking status: Never Smoker   . Smokeless tobacco: Not on file  . Alcohol Use: No    OB History    Grav Para Term Preterm Abortions TAB SAB Ect Mult Living                  Review of Systems ROS reviewed and otherwise negative except for mentioned in HPI  Allergies  Review of patient's allergies indicates no known allergies.  Home Medications   Current Outpatient Rx  Name Route Sig Dispense Refill  . ESCITALOPRAM OXALATE 20 MG PO TABS Oral Take 20 mg by mouth daily.    [name] HYDROCHLOROTHIAZIDE 12.5 MG PO CAPS Oral Take 12.5 mg by mouth daily.    [name] LORATADINE 10 MG PO TABS Oral Take 10 mg by mouth daily as needed. For itching    .  NABUMETONE 500 MG PO TABS Oral Take 500 mg by mouth 2 (two) times daily as needed. For pain    . QUETIAPINE FUMARATE 200 MG PO TABS Oral Take 400 mg by mouth at bedtime.    . OXYCODONE-ACETAMINOPHEN 5-325 MG PO TABS Oral Take 1-2 tablets by mouth every 6 (six) hours as needed for pain. 15 tablet 0    BP 139/85  Pulse 86  Temp(Src) 97.5 F (36.4 C) (Oral)  Resp 18  SpO2 95% Vitals reviewed Physical Exam Physical Examination: General appearance - alert, well appearing, and in no distress Mental status - alert, oriented to person, place, and time Mouth - mucous membranes moist, pharynx normal without lesions, no significant gingivitis, poor dentition Chest - clear to auscultation, no wheezes, rales or rhonchi, symmetric air entry Heart - normal rate, regular rhythm, normal S1, S2, no murmurs, rubs, clicks or gallops Neurological - alert, oriented, normal speech, no focal findings or movement disorder noted Musculoskeletal - ttp over right patella, FROM, no deformity or swelling Extremities - peripheral pulses normal, no pedal edema, no clubbing or cyanosis Skin - normal coloration and turgor, no rashes, no abrasions or lacerations  ED Course  Procedures (including critical care time)  Labs Reviewed - No data to display Dg Knee Complete 4 Views Right  03/07/2012  *RADIOLOGY  REPORT*  Clinical Data: Injury.  Knee pain.  RIGHT KNEE - COMPLETE 4+ VIEW  Comparison: 06/05/2010  Findings: Severe tricompartment osteoarthritic change.  There is a predilection of disease in the medial compartment.  No acute fracture and no dislocation.  IMPRESSION: No acute bony injury peri degenerative change.  Original Report Authenticated By: Donavan Burnet, M.D.     1. Pain in right knee   2. Low back pain       MDM  Patient presenting with complaint of pain in her right knee after slipping on the past 2 weeks ago. She also complains of intermittent right-sided low back pain. She had no midline tenderness  or tenderness over paraspinal muscles on back examination today. She was tender over her right patella. X-ray was obtained and this did not show any acute abnormalities. She also complains of being told that she had "a infection of her gums". She does not have any significant gingivitis but generally has poor dentition. She denies any pain in her teeth or gums at this time. I have advised to followup with her primary Dr. about her gum concerns and she states that she has talked it over with her Dr. and will arrange for followup as well. Patient was discharged with strict return precautions and is agreeable with the plan.        Ethelda Chick, MD 03/07/12 [phone]

## 2012-03-07 NOTE — ED Notes (Signed)
Pt states she slipped going to the back and has pain on right side and back and that she has "aids in her gums" and is sensitive to cold things in her mouth. She says she needs to get that cleared up

## 2012-03-07 NOTE — ED Notes (Signed)
Pt c/o pain in right hip and into lower back x 2 weeks since slipping while on bus; pt sts needs to be treated for "AIDS in her gums" also

## 2012-04-04 ENCOUNTER — Encounter (HOSPITAL_COMMUNITY): Payer: Self-pay | Admitting: *Deleted

## 2012-04-04 ENCOUNTER — Emergency Department (HOSPITAL_COMMUNITY)
Admission: EM | Admit: 2012-04-04 | Discharge: 2012-04-04 | Disposition: A | Discharge status: Home / Self Care | Payer: Medicaid Other | Attending: Emergency Medicine | Admitting: Emergency Medicine

## 2012-04-04 DIAGNOSIS — M25569 Pain in unspecified knee: Secondary | ICD-10-CM | POA: Insufficient documentation

## 2012-04-04 DIAGNOSIS — Z760 Encounter for issue of repeat prescription: Secondary | ICD-10-CM

## 2012-04-04 DIAGNOSIS — Z21 Asymptomatic human immunodeficiency virus [HIV] infection status: Secondary | ICD-10-CM | POA: Insufficient documentation

## 2012-04-04 DIAGNOSIS — I10 Essential (primary) hypertension: Secondary | ICD-10-CM | POA: Insufficient documentation

## 2012-04-04 DIAGNOSIS — Z79899 Other long term (current) drug therapy: Secondary | ICD-10-CM | POA: Insufficient documentation

## 2012-04-04 HISTORY — DX: Essential (primary) hypertension: I10

## 2012-04-04 MED ORDER — NAPROXEN 375 MG PO TABS: 375.0000 mg | ORAL_TABLET | Freq: Two times a day (BID) | ORAL | Status: DC

## 2012-04-04 MED ORDER — HYDROCHLOROTHIAZIDE 25 MG PO TABS: 12.5000 mg | ORAL_TABLET | Freq: Every day | ORAL | Status: DC

## 2012-04-04 NOTE — ED Provider Notes (Signed)
History     CSN: [id number]  Arrival date & time 04/04/12  1123   First MD Initiated Contact with Patient 04/04/12 1227      Chief Complaint  Patient presents with  . Knee Pain  . Medication Refill    (Consider location/radiation/quality/duration/timing/severity/associated sxs/prior treatment) HPI Comments: Pt presents to the ED  w cc of right knee pain. She states she sprained it a couple of weeks ago and it has since improved, however it began bothering her 2 days ago. Pt denies clicking, catching or giving out sensation of knee. Reports mild pain w ambulation. In addition pt states she has called [facility] for medication refill of her HTCZ, but they have not filled her script. She does not currently have a f-u apt scheduled w [facility].   Patient is a 56 y.o. female presenting with knee pain. The history is provided by the patient.  Knee Pain This is a new problem. The current episode started 1 to 4 weeks ago (evaluated here 2 weeks ago, got better and then acted up again 2 days ago ). The problem occurs constantly. The problem has been gradually improving. Associated symptoms include joint swelling. Pertinent negatives include no abdominal pain, anorexia, arthralgias, change in bowel habit, chest pain, chills, congestion, coughing, diaphoresis, fatigue, fever, headaches, myalgias, nausea, neck pain, numbness, rash, sore throat, swollen glands, urinary symptoms, vertigo, visual change, vomiting or weakness. The symptoms are aggravated by walking. She has tried nothing for the symptoms.    Past Medical History  Diagnosis Date  . HIV (human immunodeficiency virus infection)   . Hypertension     History reviewed. No pertinent past surgical history.  History reviewed. No pertinent family history.  History  Substance Use Topics  . Smoking status: Never Smoker   . Smokeless tobacco: Not on file  . Alcohol Use: No    OB History    Grav Para Term Preterm Abortions TAB SAB Ect  Mult Living                  Review of Systems  Constitutional: Negative for fever, chills, diaphoresis and fatigue.  HENT: Negative for congestion, sore throat and neck pain.   Respiratory: Negative for cough.   Cardiovascular: Negative for chest pain.  Gastrointestinal: Negative for nausea, vomiting, abdominal pain, anorexia and change in bowel habit.  Musculoskeletal: Positive for joint swelling. Negative for myalgias and arthralgias.  Skin: Negative for rash.  Neurological: Negative for vertigo, weakness, numbness and headaches.    Allergies  Review of patient's allergies indicates no known allergies.  Home Medications   Current Outpatient Rx  Name Route Sig Dispense Refill  . ESCITALOPRAM OXALATE 20 MG PO TABS Oral Take 20 mg by mouth daily.    [name] HYDROCHLOROTHIAZIDE 12.5 MG PO CAPS Oral Take 12.5 mg by mouth daily.    [name] LORATADINE 10 MG PO TABS Oral Take 10 mg by mouth daily as needed. For itching    . NABUMETONE 500 MG PO TABS Oral Take 500 mg by mouth daily. For pain    . QUETIAPINE FUMARATE 200 MG PO TABS Oral Take 200 mg by mouth at bedtime.       BP 111/72  Pulse 79  Temp(Src) 98.2 F (36.8 C) (Oral)  Resp 16  SpO2 95%  Physical Exam  Nursing note and vitals reviewed. Constitutional: She is oriented to person, place, and time. She appears well-developed and well-nourished. No distress.  HENT:  Head: Normocephalic and atraumatic.  Eyes: Conjunctivae and EOM are normal.  Neck: Normal range of motion.  Pulmonary/Chest: Effort normal.  Musculoskeletal: Normal range of motion.       Right knee: She exhibits normal range of motion, no swelling, no erythema, no LCL laxity, no bony tenderness, normal meniscus and no MCL laxity. tenderness found. Lateral joint line tenderness noted. No medial joint line tenderness noted.       Legs: Neurological: She is alert and oriented to person, place, and time.  Skin: Skin is warm and dry. No rash noted. She is not diaphoretic.    Psychiatric: She has a normal mood and affect. Her behavior is normal.    ED Course  Procedures (including critical care time)  Labs Reviewed - No data to display No results found.   No diagnosis found.    MDM  Knee pain, medication refill  Patient will be given a prescription for her HCTZ with advice to followup [facility].  Patient has been advised to use home conservative methods to treat her knee pain including cryotherapy.  Patient will be given a short course of anti-inflammatories for pain and advised to followup with orthopedics if symptoms persist.        Jaci Carrel, PA-C 04/04/12 1301

## 2012-04-04 NOTE — Discharge Instructions (Signed)
Be sure to read and understand instructions below prior to leaving the hospital. If your symptoms persist without any improvement in 1 week it is reccommended that you follow up with orthopedics.  Knee pain  TREATMENT  Rest, ice, elevation, and compression are the basic modes of treatment.   Apply ice to the sore area for 15 to 20 minutes, 3 to 4 times per day. Do this while you are awake for the first 2 days, or as directed. This can be stopped when the swelling goes away. Put the ice in a plastic bag and place a towel between the bag of ice and your skin.  Keep your leg elevated when possible to lessen swelling.  If your caregiver recommends crutches, use them as instructed for 1 week. Then, you may walk as tolerated.  Do not drive a vehicle on pain medication. ACTIVITY:            - Weight bearing as tolerated            - Exercises should be limited to pain free range of motion  Knee Immobilization:: This is used to support and protect an injured or painful knee. Knee immobilizers keep your knee from being used while it is healing.  Use powder to control irritation from sweat and friction.  Adjust the immobilizer to be firm but not tight. Signs of an immobilizer that is too tight include:   Swelling.   Numbness.   Color change in your foot or ankle.   Increased pain.  While resting, raise your leg above the level of your heart. This reduces throbbing and helps healing. Prop it up with pillows.  Remove the immobilizer to bathe and sleep. Wear it other times until you see your doctor again.               SEEK MEDICAL CARE IF:  You have an increase in bruising, swelling, or pain.  Your toes feel cold.  Pain relief is not achieved with medications.  EMERGENCY:: Your toes are numb or blue or you have severe pain.  You notice redness, swelling, warmth or increasing pain in your knee.  An unexplained oral temperature above 102 F (38.9 C) develops.  COLD THERAPY DIRECTIONS:  Ice or gel  packs can be used to reduce both pain and swelling. Ice is the most helpful within the first 24 to 48 hours after an injury or flareup from overusing a muscle or joint.  Ice is effective, has very few side effects, and is safe for most people to use.   If you expose your skin to cold temperatures for too long or without the proper protection, you can damage your skin or nerves. Watch for signs of skin damage due to cold.   HOME CARE INSTRUCTIONS  Follow these tips to use ice and cold packs safely.  Place a dry or damp towel between the ice and skin. A damp towel will cool the skin more quickly, so you may need to shorten the time that the ice is used.  For a more rapid response, add gentle compression to the ice.  Ice for no more than 10 to 20 minutes at a time. The bonier the area you are icing, the less time it will take to get the benefits of ice.  Check your skin after 5 minutes to make sure there are no signs of a poor response to cold or skin damage.  Rest 20 minutes or more in between uses.  Once your skin is numb, you can end your treatment. You can test numbness by very lightly touching your skin. The touch should be so light that you do not see the skin dimple from the pressure of your fingertip. When using ice, most people will feel these normal sensations in this order: cold, burning, aching, and numbness.  Do not use ice on someone who cannot communicate their responses to pain, such as small children or people with dementia.   HOW TO MAKE AN ICE PACK  To make an ice pack, do one of the following:  Place crushed ice or a bag of frozen vegetables in a sealable plastic bag. Squeeze out the excess air. Place this bag inside another plastic bag. Slide the bag into a pillowcase or place a damp towel between your skin and the bag.  Mix 3 parts water with 1 part rubbing alcohol. Freeze the mixture in a sealable plastic bag. When you remove the mixture from the freezer, it will be slushy.  Squeeze out the excess air. Place this bag inside another plastic bag. Slide the bag into a pillowcase or place a damp towel between your s

## 2012-04-04 NOTE — ED Notes (Signed)
States has been calling her doctor for refill of her hctz 12.5 mg tabs. States has not gotten refilled yet. Is concerned because is her bp med

## 2012-04-04 NOTE — ED Provider Notes (Signed)
History/physical exam/procedure(s) were performed by non-physician practitioner and as supervising physician I was immediately available for consultation/collaboration. I have reviewed all notes and am in agreement with care and plan.   Anabelle Bungert S Deloria Brassfield, MD 04/04/12 1918 

## 2012-04-04 NOTE — ED Notes (Signed)
To ED for eval of right knee continued pain. Also states she is need of her HCTZ to be refilled. States she is under the care of [facility] but Dr Philipp Deputy wont refill for her.  States she gets out of breath if doesn't have meds. No sob noted now.

## 2012-05-28 ENCOUNTER — Other Ambulatory Visit (HOSPITAL_COMMUNITY): Payer: Self-pay | Admitting: Internal Medicine

## 2012-05-28 DIAGNOSIS — Z1231 Encounter for screening mammogram for malignant neoplasm of breast: Secondary | ICD-10-CM

## 2012-06-09 ENCOUNTER — Ambulatory Visit (HOSPITAL_COMMUNITY)
Admission: RE | Admit: 2012-06-09 | Discharge: 2012-06-09 | Disposition: A | Discharge status: Home / Self Care | Payer: Medicaid Other | Source: Ambulatory Visit | Attending: Internal Medicine | Admitting: Internal Medicine

## 2012-06-09 DIAGNOSIS — Z1231 Encounter for screening mammogram for malignant neoplasm of breast: Secondary | ICD-10-CM | POA: Insufficient documentation

## 2012-06-13 ENCOUNTER — Emergency Department (HOSPITAL_COMMUNITY): Payer: Medicaid Other

## 2012-06-13 ENCOUNTER — Encounter (HOSPITAL_COMMUNITY): Payer: Self-pay

## 2012-06-13 ENCOUNTER — Emergency Department (HOSPITAL_COMMUNITY)
Admission: EM | Admit: 2012-06-13 | Discharge: 2012-06-13 | Disposition: A | Discharge status: Home / Self Care | Payer: Medicaid Other | Attending: Emergency Medicine | Admitting: Emergency Medicine

## 2012-06-13 DIAGNOSIS — T148XXA Other injury of unspecified body region, initial encounter: Secondary | ICD-10-CM

## 2012-06-13 DIAGNOSIS — S8010XA Contusion of unspecified lower leg, initial encounter: Secondary | ICD-10-CM | POA: Insufficient documentation

## 2012-06-13 DIAGNOSIS — S8012XA Contusion of left lower leg, initial encounter: Secondary | ICD-10-CM

## 2012-06-13 DIAGNOSIS — W010XXA Fall on same level from slipping, tripping and stumbling without subsequent striking against object, initial encounter: Secondary | ICD-10-CM | POA: Insufficient documentation

## 2012-06-13 DIAGNOSIS — I10 Essential (primary) hypertension: Secondary | ICD-10-CM | POA: Insufficient documentation

## 2012-06-13 DIAGNOSIS — Z90710 Acquired absence of both cervix and uterus: Secondary | ICD-10-CM | POA: Insufficient documentation

## 2012-06-13 DIAGNOSIS — IMO0002 Reserved for concepts with insufficient information to code with codable children: Secondary | ICD-10-CM | POA: Insufficient documentation

## 2012-06-13 DIAGNOSIS — Z21 Asymptomatic human immunodeficiency virus [HIV] infection status: Secondary | ICD-10-CM | POA: Insufficient documentation

## 2012-06-13 MED ORDER — BACITRACIN 500 UNIT/GM EX OINT
1.0000 "application " | TOPICAL_OINTMENT | Freq: Two times a day (BID) | CUTANEOUS | Status: DC
  Administered 2012-06-13: 1 via TOPICAL
  Filled 2012-06-13: qty 0.9

## 2012-06-13 MED ORDER — IBUPROFEN 800 MG PO TABS
800.0000 mg | ORAL_TABLET | Freq: Once | ORAL | Status: AC
  Administered 2012-06-13: 800 mg via ORAL
  Filled 2012-06-13: qty 1

## 2012-06-13 MED ORDER — IBUPROFEN 800 MG PO TABS: 800.0000 mg | ORAL_TABLET | Freq: Three times a day (TID) | ORAL | Status: AC | PRN

## 2012-06-13 NOTE — ED Notes (Signed)
Pt presented to the ER with complaint of lt leg injury when she missed the step while getting into the bus yesterday, with bruising and swelling to the lt shin noted.

## 2012-06-13 NOTE — ED Provider Notes (Signed)
History     CSN: [id number]  Arrival date & time 06/13/12  1727   First MD Initiated Contact with Patient 06/13/12 1809      Chief Complaint  Patient presents with  . Leg Injury    (Consider location/radiation/quality/duration/timing/severity/associated sxs/prior treatment) HPI Comments: Patient reports she tripped and fell up the steps of a city bus yesterday.  Reports pain in her left shin, worse with walking.  Pt states she took a medication she has for headaches at home for her pain which did help. Pt is able to ambulate but notes that it hurts.   Denies hitting head, LOC.  Denies knee, ankle, or foot pain.  Denies other injury.    The history is provided by the patient.    Past Medical History  Diagnosis Date  . HIV (human immunodeficiency virus infection)   . Hypertension     Past Surgical History  Procedure Date  . Abdominal hysterectomy   . Eye surgery   . Thyroid surgery   . Ankle surgery   . Finger surgery     No family history on file.  History  Substance Use Topics  . Smoking status: Never Smoker   . Smokeless tobacco: Not on file  . Alcohol Use: No    OB History    Grav Para Term Preterm Abortions TAB SAB Ect Mult Living                  Review of Systems  Skin: Positive for wound. Negative for color change and pallor.  Neurological: Negative for syncope, weakness and numbness.    Allergies  Review of patient's allergies indicates no known allergies.  Home Medications   Current Outpatient Rx  Name Route Sig Dispense Refill  . CYCLOBENZAPRINE HCL 10 MG PO TABS Oral Take 10 mg by mouth 3 (three) times daily as needed. For muscle spasms    . ESCITALOPRAM OXALATE 20 MG PO TABS Oral Take 20 mg by mouth daily.    [name] HYDROCHLOROTHIAZIDE 12.5 MG PO CAPS Oral Take 12.5 mg by mouth daily.    [name] LORATADINE 10 MG PO TABS Oral Take 10 mg by mouth daily as needed. For itching    . QUETIAPINE FUMARATE 200 MG PO TABS Oral Take 400 mg by mouth at bedtime.       BP 124/84  Pulse 89  Temp 97 F (36.1 C) (Oral)  Resp 16  SpO2 97%  Physical Exam  Nursing note and vitals reviewed. Constitutional: She appears well-developed and well-nourished. No distress.  HENT:  Head: Normocephalic and atraumatic.  Neck: Neck supple.  Pulmonary/Chest: Effort normal.  Musculoskeletal:       Left knee: Normal.       Left ankle: She exhibits decreased range of motion. She exhibits no swelling. no tenderness.       Left lower leg: She exhibits tenderness and swelling.       Legs:      Lower extremities: distal pulses intact and equal bilaterally, motor and sensation intact.    Neurological: She is alert.  Skin: She is not diaphoretic.    ED Course  Procedures (including critical care time)  Labs Reviewed - No data to display Dg Tibia/fibula Left  06/13/2012  *RADIOLOGY REPORT*  Clinical Data: Pain and swelling in the left lower leg.  Fell. Bruising and abrasion.  LEFT TIBIA AND FIBULA - 2 VIEW  Comparison: None.  Findings: Old lateral approach fibular plate and screw fixation is present.  Post-traumatic osteoarthritis of the ankle.  Proximal tibia and fibula intact.  Distal fibula intact.  Medial and lateral compartment osteoarthritis of the knee is present.  No effusion is identified on the lateral view. Patellofemoral osteoarthritis is incidentally noted.  The  IMPRESSION:  1.  No acute osseous abnormality. 2.  Post-traumatic osteoarthritis of the ankle.  Distal fibular plate and screw fixation. 3.  Ankle and knee osteoarthritis.  Original Report Authenticated By: Andreas Newport, M.D.     1. Contusion of left lower leg   2. Abrasion       MDM  Patient with contusion to left shin yesterday after accidentally falling up the stairs of the bus.  Slight abrasion to shin.  Wound cleaned and dressed in ED.  Medical hx notes +HIV and PCP is Yisroel Ramming, however, I am unable to locate any records of a positive HIV test or any recent CD4 count.  Unsure of  status of this.  Pt is not on any HIV medications.  Patient given wound care instructions and return precautions for wound infection.  Pain treated in ED.  Patient verbalizes understanding and agrees with plan.          Dillard Cannon [place], [place] 06/13/12 1940

## 2012-06-13 NOTE — Discharge Instructions (Signed)
Read the information.  Follow up with your primary care provider. Keep you wound clean and covered while it heals.  Return to the ER immediately if you develop redness, swelling, pus draining from the wound, or fevers greater than 100.4.  You may return to the ER at any time for worsening condition or any new symptoms that concern you.   Contusion A contusion is a deep bruise. Contusions happen when an injury causes bleeding under the skin. Signs of bruising include pain, puffiness (swelling), and discolored skin. The contusion may turn blue, purple, or yellow. HOME CARE   Put ice on the injured area.   Put ice in a plastic bag.   Place a towel between your skin and the bag.   Leave the ice on for 15 to 20 minutes, 3 to 4 times a day.   Only take medicine as told by your doctor.   Rest the injured area.   If possible, raise (elevate) the injured area to lessen puffiness.  GET HELP RIGHT AWAY IF:   You have more bruising or puffiness.   You have pain that is getting worse.   Your puffiness or pain is not helped by medicine.  MAKE SURE YOU:   Understand these instructions.   Will watch your condition.   Will get help right away if you are not doing well or get worse.  Document Released: 06/04/2008 Document Revised: 12/06/2011 Document Reviewed: 10/22/2011 [facility] Patient Information 2012 [place], [place]  Abrasions An abrasion is a scraped area on the skin. Abrasions do not go through all layers of the skin.  HOME CARE  Change any bandages (dressings) as told by your doctor. If the bandage sticks, soak it off with warm, soapy water. Change the bandage if it gets wet, dirty, or starts to smell.   Wash the area with soap and water twice a day. Rinse off the soap. Pat the area dry with a clean towel.   Look at the injured area for signs of infection. Infection signs include redness, puffiness (swelling), tenderness, or yellowish white fluid (pus) coming from the wound.    Apply medicated cream as told by your doctor.   Only take medicine as told by your doctor.   Follow up with your doctor as told.  GET HELP RIGHT AWAY IF:   You have more pain in your wound.   You have redness, puffiness (swelling), or tenderness around your wound.   You have yellowish white fluid (pus) coming from your wound.   You have a fever.   A bad smell is coming from the wound or bandage.  MAKE SURE YOU:   Understand these instructions.   Will watch your condition.   Will get help right away if you are not doing well or get worse.  Document Released: 06/04/2008 Document Revised: 12/06/2011 Document Reviewed: 11/20/2011 [facility] Patient Information 2012 [place], [place].Marland KitchenAbrasions An abrasion is a scraped area on the skin. Abrasions do not go through all layers of the skin.  HOME CARE  Change any bandages (dressings) as told by your doctor. If the bandage sticks, soak it off with warm, soapy water. Change the bandage if it gets wet, dirty, or starts to smell.   Wash the area with soap and water twice a day. Rinse off the soap. Pat the area dry with a clean towel.   Look at the injured area for signs of infection. Infection signs include redness, puffiness (swelling), tenderness, or yellowish white fluid (pus) coming from the  wound.   Apply medicated cream as told by your doctor.   Only take medicine as told by your doctor.   Follow up with your doctor as told.  GET HELP RIGHT AWAY IF:   You have more pain in your wound.   You have redness, puffiness (swelling), or tenderness around your wound.   You have yellowish white fluid (pus) coming from your wound.   You have a fever.   A bad smell is coming from the wound or bandage.  MAKE SURE YOU:   Understand these instructions.   Will watch your condition.   Will get help right away if you are not doing well or get worse.  Document Released: 06/04/2008 Document Revised: 12/06/2011 Document Reviewed:  11/20/2011 [facility] Patient Information 2012 [place], [place].

## 2012-06-24 ENCOUNTER — Ambulatory Visit (HOSPITAL_COMMUNITY): Payer: Medicaid Other

## 2012-06-25 NOTE — ED Provider Notes (Signed)
History/physical exam/procedure(s) were performed by non-physician practitioner and as supervising physician I was immediately available for consultation/collaboration. I have reviewed all notes and am in agreement with care and plan.   Hilario Quarry, MD 06/25/12 [phone]

## 2012-07-02 ENCOUNTER — Emergency Department (HOSPITAL_COMMUNITY)
Admission: EM | Admit: 2012-07-02 | Discharge: 2012-07-02 | Disposition: A | Discharge status: Home / Self Care | Payer: Medicaid Other | Attending: Emergency Medicine | Admitting: Emergency Medicine

## 2012-07-02 ENCOUNTER — Emergency Department (HOSPITAL_COMMUNITY): Payer: Medicaid Other

## 2012-07-02 ENCOUNTER — Encounter (HOSPITAL_COMMUNITY): Payer: Self-pay | Admitting: *Deleted

## 2012-07-02 DIAGNOSIS — K1120 Sialoadenitis, unspecified: Secondary | ICD-10-CM

## 2012-07-02 DIAGNOSIS — Z21 Asymptomatic human immunodeficiency virus [HIV] infection status: Secondary | ICD-10-CM | POA: Insufficient documentation

## 2012-07-02 DIAGNOSIS — R220 Localized swelling, mass and lump, head: Secondary | ICD-10-CM | POA: Insufficient documentation

## 2012-07-02 DIAGNOSIS — Y9289 Other specified places as the place of occurrence of the external cause: Secondary | ICD-10-CM | POA: Insufficient documentation

## 2012-07-02 DIAGNOSIS — W19XXXA Unspecified fall, initial encounter: Secondary | ICD-10-CM | POA: Insufficient documentation

## 2012-07-02 DIAGNOSIS — R221 Localized swelling, mass and lump, neck: Secondary | ICD-10-CM | POA: Insufficient documentation

## 2012-07-02 DIAGNOSIS — Z79899 Other long term (current) drug therapy: Secondary | ICD-10-CM | POA: Insufficient documentation

## 2012-07-02 DIAGNOSIS — I10 Essential (primary) hypertension: Secondary | ICD-10-CM | POA: Insufficient documentation

## 2012-07-02 NOTE — ED Notes (Signed)
Patient with three day history of facial swelling to left side of face before ear.  Pain to palpation, no pain in ear or with teeth.  Patient states she fell a week ago on the city bus, does not remember if she struck that side of face.

## 2012-07-02 NOTE — ED Notes (Signed)
Pt is here for swelling on left side of her face (next to ear).  No dental pain with this, area is painful to palpation.  No fever or chills at this time.

## 2012-07-02 NOTE — ED Notes (Signed)
Pt signed to go against medical advise.GCS 15
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
## 2012-07-07 NOTE — ED Provider Notes (Signed)
Medical screening examination/treatment/procedure(s) were performed by non-physician practitioner and as supervising physician I was immediately available for consultation/collaboration.  Lamberto Dinapoli, MD 07/07/12 0716 

## 2012-08-12 ENCOUNTER — Encounter (HOSPITAL_COMMUNITY): Payer: Self-pay | Admitting: *Deleted

## 2012-08-12 ENCOUNTER — Emergency Department (HOSPITAL_COMMUNITY)
Admission: EM | Admit: 2012-08-12 | Discharge: 2012-08-12 | Disposition: A | Discharge status: Home / Self Care | Payer: Medicaid Other | Attending: Emergency Medicine | Admitting: Emergency Medicine

## 2012-08-12 DIAGNOSIS — IMO0002 Reserved for concepts with insufficient information to code with codable children: Secondary | ICD-10-CM | POA: Insufficient documentation

## 2012-08-12 DIAGNOSIS — Z21 Asymptomatic human immunodeficiency virus [HIV] infection status: Secondary | ICD-10-CM | POA: Insufficient documentation

## 2012-08-12 DIAGNOSIS — L089 Local infection of the skin and subcutaneous tissue, unspecified: Secondary | ICD-10-CM | POA: Insufficient documentation

## 2012-08-12 DIAGNOSIS — T148XXA Other injury of unspecified body region, initial encounter: Secondary | ICD-10-CM

## 2012-08-12 DIAGNOSIS — Y9289 Other specified places as the place of occurrence of the external cause: Secondary | ICD-10-CM | POA: Insufficient documentation

## 2012-08-12 DIAGNOSIS — I10 Essential (primary) hypertension: Secondary | ICD-10-CM | POA: Insufficient documentation

## 2012-08-12 LAB — GLUCOSE, CAPILLARY: Glucose-Capillary: 94 mg/dL (ref 70–99)

## 2012-08-12 MED ORDER — MUPIROCIN CALCIUM 2 % EX CREA: TOPICAL_CREAM | Freq: Three times a day (TID) | CUTANEOUS | Status: AC

## 2012-08-12 MED ORDER — SULFAMETHOXAZOLE-TRIMETHOPRIM 800-160 MG PO TABS: 1.0000 | ORAL_TABLET | Freq: Two times a day (BID) | ORAL | Status: AC

## 2012-08-12 NOTE — ED Notes (Signed)
CBG 94 
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
## 2012-10-14 ENCOUNTER — Encounter (HOSPITAL_COMMUNITY): Payer: Self-pay | Admitting: Emergency Medicine

## 2012-10-14 ENCOUNTER — Emergency Department (HOSPITAL_COMMUNITY)
Admission: EM | Admit: 2012-10-14 | Discharge: 2012-10-14 | Disposition: A | Discharge status: Home / Self Care | Payer: Medicaid Other | Attending: Emergency Medicine | Admitting: Emergency Medicine

## 2012-10-14 DIAGNOSIS — I10 Essential (primary) hypertension: Secondary | ICD-10-CM | POA: Insufficient documentation

## 2012-10-14 DIAGNOSIS — Z79899 Other long term (current) drug therapy: Secondary | ICD-10-CM | POA: Insufficient documentation

## 2012-10-14 DIAGNOSIS — Z21 Asymptomatic human immunodeficiency virus [HIV] infection status: Secondary | ICD-10-CM | POA: Insufficient documentation

## 2012-10-14 DIAGNOSIS — X58XXXA Exposure to other specified factors, initial encounter: Secondary | ICD-10-CM | POA: Insufficient documentation

## 2012-10-14 DIAGNOSIS — S0990XA Unspecified injury of head, initial encounter: Secondary | ICD-10-CM | POA: Insufficient documentation

## 2012-10-14 NOTE — ED Provider Notes (Signed)
History    This chart was scribed for Hurman Horn, MD by Albertha Ghee Rifaie. This patient was seen in room TR02C/TR02C and the patient's care was started at 4:11PM.  CSN: [id number]  Arrival date & time 10/14/12  1418   None     Chief Complaint  Patient presents with  . Eye Pain    The history is provided by the patient. No language interpreter was used.   Diane Sutton is a 56 y.o. female who presents to the Emergency Department to get an advise about her left eye vision if it is ever going to improve after having a surgery 2 years ago. She states having an appontiment with her PCP last week but didn't give her a clear answer and she is not going to been seen by him again. She states hitting her head over her right eye yesterday and having a right eye pain and sever HA for few minutes as associated symptoms but she is comfortable now. She denies being legally blind at her left eye and her good eye vision is the right eye. She denies having fever, slurred speech, blurred vision, LOC, neck pain, confusion, and chills after the hit. Pt denies smoking and alcohol use.   Pt denies going blind on her right eye after hitting her head as scribed in the triage section.    Past Medical History  Diagnosis Date  . HIV (human immunodeficiency virus infection)   . Hypertension     Past Surgical History  Procedure Date  . Abdominal hysterectomy   . Eye surgery   . Thyroid surgery   . Ankle surgery   . Finger surgery     No family history on file.  History  Substance Use Topics  . Smoking status: Never Smoker   . Smokeless tobacco: Not on file  . Alcohol Use: No    No OB history provided  Review of Systems  10 Systems reviewed and all are negative for acute change except as noted in the HPI.  Allergies  Review of patient's allergies indicates no known allergies.  Home Medications   Current Outpatient Rx  Name Route Sig Dispense Refill  . ESCITALOPRAM OXALATE 20 MG PO  TABS Oral Take 20 mg by mouth daily.    [name] HYDROCHLOROTHIAZIDE 12.5 MG PO CAPS Oral Take 12.5 mg by mouth daily.    [name] NABUMETONE 500 MG PO TABS Oral Take 500 mg by mouth 2 (two) times daily.    . QUETIAPINE FUMARATE 200 MG PO TABS Oral Take 400 mg by mouth at bedtime.    . TOBRAMYCIN-DEXAMETHASONE 0.3-0.1 % OP SUSP Both Eyes Place 1 drop into both eyes 4 (four) times daily.      BP 128/64  Pulse 88  Temp 98.3 F (36.8 C)  Resp 16  Physical Exam  Nursing note and vitals reviewed. Constitutional:       Awake, alert, nontoxic appearance with baseline speech for patient.  HENT:  Head: Atraumatic.  Mouth/Throat: No oropharyngeal exudate.       Mild tenderness to the right forehead  Eyes: baseline strabismus left eye with baseline decrease vision left eye.   Eyes: EOM are normal. Pupils are equal, round, and reactive to light. Right eye exhibits no discharge. Left eye exhibits no discharge.  Neck: Neck supple.  Cardiovascular: Normal rate and regular rhythm.   No murmur heard. Pulmonary/Chest: Effort normal and breath sounds normal. No stridor. No respiratory distress. She has no wheezes. She has no  rales. She exhibits no tenderness.  Abdominal: Soft. Bowel sounds are normal. She exhibits no mass. There is no tenderness. There is no rebound.  Musculoskeletal: She exhibits no tenderness.       Baseline ROM, moves extremities with no obvious new focal weakness.  Lymphadenopathy:    She has no cervical adenopathy.  Neurological:       Awake, alert, cooperative and aware of situation; motor strength bilaterally; sensation normal to light touch bilaterally; peripheral visual fields full to confrontation; no facial asymmetry; tongue midline; major cranial nerves appear intact; no pronator drift, normal finger to nose bilaterally, baseline gait without new ataxia.  Skin: No rash noted.  Psychiatric: She has a normal mood and affect.    ED Course  Procedures (including critical care  time)  Labs Reviewed - No data to display No results found.  DIAGNOSTIC STUDIES: Oxygen Saturation is N/A  COORDINATION OF CARE: 4:19PM Patient / Family / Caregiver informed of clinical course, understand medical decision-making process, and agree with plan.  1. Minor head injury       MDM   I personally performed the services described in this documentation, which was scribed in my presence. The recorded information has been reviewed and considered.  Hurman Horn, MD 10/18/12 [phone]
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
## 2012-12-25 ENCOUNTER — Emergency Department (HOSPITAL_COMMUNITY)
Admission: EM | Admit: 2012-12-25 | Discharge: 2012-12-25 | Disposition: A | Discharge status: Home / Self Care | Payer: Medicaid Other | Attending: Emergency Medicine | Admitting: Emergency Medicine

## 2012-12-25 ENCOUNTER — Encounter (HOSPITAL_COMMUNITY): Payer: Self-pay | Admitting: *Deleted

## 2012-12-25 DIAGNOSIS — G4700 Insomnia, unspecified: Secondary | ICD-10-CM | POA: Insufficient documentation

## 2012-12-25 DIAGNOSIS — S46909A Unspecified injury of unspecified muscle, fascia and tendon at shoulder and upper arm level, unspecified arm, initial encounter: Secondary | ICD-10-CM | POA: Insufficient documentation

## 2012-12-25 DIAGNOSIS — I10 Essential (primary) hypertension: Secondary | ICD-10-CM | POA: Insufficient documentation

## 2012-12-25 DIAGNOSIS — Z7982 Long term (current) use of aspirin: Secondary | ICD-10-CM | POA: Insufficient documentation

## 2012-12-25 DIAGNOSIS — Y939 Activity, unspecified: Secondary | ICD-10-CM | POA: Insufficient documentation

## 2012-12-25 DIAGNOSIS — S4980XA Other specified injuries of shoulder and upper arm, unspecified arm, initial encounter: Secondary | ICD-10-CM | POA: Insufficient documentation

## 2012-12-25 DIAGNOSIS — Z79899 Other long term (current) drug therapy: Secondary | ICD-10-CM | POA: Insufficient documentation

## 2012-12-25 DIAGNOSIS — Y929 Unspecified place or not applicable: Secondary | ICD-10-CM | POA: Insufficient documentation

## 2012-12-25 DIAGNOSIS — Z21 Asymptomatic human immunodeficiency virus [HIV] infection status: Secondary | ICD-10-CM | POA: Insufficient documentation

## 2012-12-25 MED ORDER — ZOLPIDEM TARTRATE 5 MG PO TABS: 5.0000 mg | ORAL_TABLET | Freq: Every evening | ORAL | Status: DC | PRN
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
## 2013-06-13 ENCOUNTER — Emergency Department (HOSPITAL_COMMUNITY)
Admission: EM | Admit: 2013-06-13 | Discharge: 2013-06-13 | Disposition: A | Discharge status: Home / Self Care | Payer: Medicaid Other | Attending: Emergency Medicine | Admitting: Emergency Medicine

## 2013-06-13 ENCOUNTER — Encounter (HOSPITAL_COMMUNITY): Payer: Self-pay | Admitting: Family Medicine

## 2013-06-13 DIAGNOSIS — Z8739 Personal history of other diseases of the musculoskeletal system and connective tissue: Secondary | ICD-10-CM | POA: Insufficient documentation

## 2013-06-13 DIAGNOSIS — M25562 Pain in left knee: Secondary | ICD-10-CM

## 2013-06-13 DIAGNOSIS — Z7982 Long term (current) use of aspirin: Secondary | ICD-10-CM | POA: Insufficient documentation

## 2013-06-13 DIAGNOSIS — Z79899 Other long term (current) drug therapy: Secondary | ICD-10-CM | POA: Insufficient documentation

## 2013-06-13 DIAGNOSIS — M25569 Pain in unspecified knee: Secondary | ICD-10-CM | POA: Insufficient documentation

## 2013-06-13 DIAGNOSIS — Z21 Asymptomatic human immunodeficiency virus [HIV] infection status: Secondary | ICD-10-CM | POA: Insufficient documentation

## 2013-06-13 DIAGNOSIS — I10 Essential (primary) hypertension: Secondary | ICD-10-CM | POA: Insufficient documentation

## 2013-06-13 DIAGNOSIS — M79609 Pain in unspecified limb: Secondary | ICD-10-CM | POA: Insufficient documentation

## 2013-06-13 MED ORDER — TRAMADOL HCL 50 MG PO TABS: 50.0000 mg | ORAL_TABLET | Freq: Four times a day (QID) | ORAL | Status: DC | PRN

## 2013-06-13 NOTE — ED Notes (Signed)
Per pt has been having knee pain and leg pain x 1 month. taking Ibuprofen per her doctor and relieves some of the pain. sts the medication makes her sick.
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
## 2013-06-14 NOTE — ED Provider Notes (Signed)
Medical screening examination/treatment/procedure(s) were performed by non-physician practitioner and as supervising physician I was immediately available for consultation/collaboration.  Doug Sou, MD 06/14/13 [phone]

## 2013-07-08 ENCOUNTER — Ambulatory Visit
Admission: RE | Admit: 2013-07-08 | Discharge: 2013-07-08 | Disposition: A | Discharge status: Home / Self Care | Payer: Medicaid Other | Source: Ambulatory Visit | Attending: Family Medicine | Admitting: Family Medicine

## 2013-07-08 ENCOUNTER — Other Ambulatory Visit: Payer: Self-pay | Admitting: Family Medicine

## 2013-07-08 DIAGNOSIS — M24819 Other specific joint derangements of unspecified shoulder, not elsewhere classified: Secondary | ICD-10-CM

## 2013-08-10 ENCOUNTER — Other Ambulatory Visit (HOSPITAL_COMMUNITY): Payer: Self-pay | Admitting: Family Medicine

## 2013-08-10 DIAGNOSIS — Z1231 Encounter for screening mammogram for malignant neoplasm of breast: Secondary | ICD-10-CM

## 2013-08-13 ENCOUNTER — Ambulatory Visit (HOSPITAL_COMMUNITY)
Admission: RE | Admit: 2013-08-13 | Discharge: 2013-08-13 | Disposition: A | Discharge status: Home / Self Care | Payer: Medicaid Other | Source: Ambulatory Visit | Attending: Family Medicine | Admitting: Family Medicine

## 2013-08-13 DIAGNOSIS — Z1231 Encounter for screening mammogram for malignant neoplasm of breast: Secondary | ICD-10-CM

## 2014-01-19 ENCOUNTER — Encounter (HOSPITAL_BASED_OUTPATIENT_CLINIC_OR_DEPARTMENT_OTHER): Payer: Self-pay | Admitting: *Deleted

## 2014-01-20 ENCOUNTER — Encounter (HOSPITAL_BASED_OUTPATIENT_CLINIC_OR_DEPARTMENT_OTHER): Payer: Self-pay | Admitting: *Deleted

## 2014-01-21 ENCOUNTER — Encounter (HOSPITAL_BASED_OUTPATIENT_CLINIC_OR_DEPARTMENT_OTHER): Payer: Self-pay | Admitting: *Deleted

## 2014-01-21 NOTE — Progress Notes (Signed)
Not able to reach pt yet-dr youngs office notified

## 2014-01-21 NOTE — Progress Notes (Signed)
Denies any cardiac or resp problems

## 2014-01-21 NOTE — H&P (Signed)
  Date of examination:  01-14-13  Indication for surgery: To straighten the eyes and allow some binocularity  Pertinent past medical history:  Past Medical History  Diagnosis Date  . HIV (human immunodeficiency virus infection)     positive-non active  . Hypertension   . 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  Patient says the XT started in her teenage years and initially was intermittent.  On my exam there is no significant vertical misalignment; the pupils are equal; and there is no significant limitation of horizontal ductions of either eye.  Patient does exhibit a 20 degree left face turn.  She gives history of a "tumor" "with eggs" in her left upper lid.  Pertinent family history: History reviewed. No pertinent family history.  General:  Healthy appearing patient in no distress.    Eyes:    Acuity [place]  OD 20/25  OS 20/30  External: Within normal limits     Anterior segment:  Conjunctival scars OU, most prominent nasally and temporally OS.  Mod NS OU  Motility:   XT = (30+35), small "A", 2+ SOOA OU.  XT' (40+30) OU  Fundus: deferred  Refraction:  low plus OU   Heart: Regular rate and rhythm without murmur     Lungs: Clear to auscultation     Abdomen: Soft, nontender, normal bowel sounds     Impression:XT, large angle, recurrent, "A" pattern, no sign of slipped MR s/p LLR recess and LMR resect  Plan: RLR recess, RMR resect, LMR re-resect, ?revise conj temporally OS, SO tenotomy OU  Derry Skill

## 2014-01-22 ENCOUNTER — Encounter (HOSPITAL_BASED_OUTPATIENT_CLINIC_OR_DEPARTMENT_OTHER): Payer: Medicaid Other | Admitting: Anesthesiology

## 2014-01-22 ENCOUNTER — Encounter (HOSPITAL_BASED_OUTPATIENT_CLINIC_OR_DEPARTMENT_OTHER)
Admission: RE | Disposition: A | Discharge status: Home / Self Care | Payer: Self-pay | Source: Ambulatory Visit | Attending: Ophthalmology

## 2014-01-22 ENCOUNTER — Ambulatory Visit (HOSPITAL_BASED_OUTPATIENT_CLINIC_OR_DEPARTMENT_OTHER): Payer: Medicaid Other | Admitting: Anesthesiology

## 2014-01-22 ENCOUNTER — Ambulatory Visit (HOSPITAL_BASED_OUTPATIENT_CLINIC_OR_DEPARTMENT_OTHER)
Admission: RE | Admit: 2014-01-22 | Discharge: 2014-01-22 | Disposition: A | Discharge status: Home / Self Care | Payer: Medicaid Other | Source: Ambulatory Visit | Attending: Ophthalmology | Admitting: Ophthalmology

## 2014-01-22 DIAGNOSIS — E0590 Thyrotoxicosis, unspecified without thyrotoxic crisis or storm: Secondary | ICD-10-CM | POA: Insufficient documentation

## 2014-01-22 DIAGNOSIS — I10 Essential (primary) hypertension: Secondary | ICD-10-CM | POA: Insufficient documentation

## 2014-01-22 DIAGNOSIS — B20 Human immunodeficiency virus [HIV] disease: Secondary | ICD-10-CM | POA: Insufficient documentation

## 2014-01-22 DIAGNOSIS — H5000 Unspecified esotropia: Secondary | ICD-10-CM | POA: Insufficient documentation

## 2014-01-22 HISTORY — DX: Major depressive disorder, single episode, unspecified: F32.9

## 2014-01-22 HISTORY — PX: STRABISMUS SURGERY: SHX218

## 2014-01-22 HISTORY — DX: Depression, unspecified: F32.A

## 2014-01-22 LAB — POCT HEMOGLOBIN-HEMACUE: HEMOGLOBIN: 10.2 g/dL — AB (ref 12.0–15.0)

## 2014-01-22 SURGERY — STRABISMUS SURGERY, BILATERAL
Anesthesia: General | Site: Eye | Laterality: Bilateral

## 2014-01-22 MED ORDER — KETOROLAC TROMETHAMINE 30 MG/ML IJ SOLN
INTRAMUSCULAR | Status: DC | PRN
  Administered 2014-01-22: 30 mg via INTRAVENOUS

## 2014-01-22 MED ORDER — FENTANYL CITRATE 0.05 MG/ML IJ SOLN
INTRAMUSCULAR | Status: AC
  Filled 2014-01-22: qty 4

## 2014-01-22 MED ORDER — OXYCODONE HCL 5 MG/5ML PO SOLN: 5.0000 mg | Freq: Once | ORAL | Status: DC | PRN

## 2014-01-22 MED ORDER — FENTANYL CITRATE 0.05 MG/ML IJ SOLN
25.0000 ug | INTRAMUSCULAR | Status: DC | PRN
  Administered 2014-01-22: 25 ug via INTRAVENOUS
  Administered 2014-01-22: 50 ug via INTRAVENOUS

## 2014-01-22 MED ORDER — TOBRAMYCIN-DEXAMETHASONE 0.3-0.1 % OP OINT
TOPICAL_OINTMENT | OPHTHALMIC | Status: DC | PRN
  Administered 2014-01-22: 1 via OPHTHALMIC

## 2014-01-22 MED ORDER — OXYCODONE HCL 5 MG PO TABS: 5.0000 mg | ORAL_TABLET | Freq: Once | ORAL | Status: DC | PRN

## 2014-01-22 MED ORDER — OXYCODONE-ACETAMINOPHEN 7.5-325 MG PO TABS: 1.0000 | ORAL_TABLET | ORAL | Status: DC | PRN

## 2014-01-22 MED ORDER — ATROPINE SULFATE 0.4 MG/ML IJ SOLN
INTRAMUSCULAR | Status: DC | PRN
  Administered 2014-01-22: .2 mg via INTRAVENOUS

## 2014-01-22 MED ORDER — DEXAMETHASONE SODIUM PHOSPHATE 4 MG/ML IJ SOLN
INTRAMUSCULAR | Status: DC | PRN
  Administered 2014-01-22: 10 mg via INTRAVENOUS

## 2014-01-22 MED ORDER — MIDAZOLAM HCL 5 MG/5ML IJ SOLN
INTRAMUSCULAR | Status: DC | PRN
  Administered 2014-01-22: 2 mg via INTRAVENOUS

## 2014-01-22 MED ORDER — ONDANSETRON HCL 4 MG/2ML IJ SOLN: 4.0000 mg | Freq: Four times a day (QID) | INTRAMUSCULAR | Status: DC | PRN

## 2014-01-22 MED ORDER — FENTANYL CITRATE 0.05 MG/ML IJ SOLN
INTRAMUSCULAR | Status: DC | PRN
  Administered 2014-01-22: 75 ug via INTRAVENOUS
  Administered 2014-01-22: 25 ug via INTRAVENOUS

## 2014-01-22 MED ORDER — BSS IO SOLN
INTRAOCULAR | Status: DC | PRN
  Administered 2014-01-22: 10 mL via INTRAOCULAR

## 2014-01-22 MED ORDER — TOBRAMYCIN-DEXAMETHASONE 0.3-0.1 % OP OINT: 1.0000 "application " | TOPICAL_OINTMENT | Freq: Two times a day (BID) | OPHTHALMIC | Status: DC

## 2014-01-22 MED ORDER — LACTATED RINGERS IV SOLN
INTRAVENOUS | Status: DC
  Administered 2014-01-22: 09:00:00 via INTRAVENOUS

## 2014-01-22 MED ORDER — TOBRAMYCIN-DEXAMETHASONE 0.3-0.1 % OP OINT
TOPICAL_OINTMENT | OPHTHALMIC | Status: AC
  Filled 2014-01-22: qty 7

## 2014-01-22 MED ORDER — PROPOFOL 10 MG/ML IV BOLUS
INTRAVENOUS | Status: DC | PRN
  Administered 2014-01-22: 200 mg via INTRAVENOUS

## 2014-01-22 MED ORDER — HYDRALAZINE HCL 20 MG/ML IJ SOLN
INTRAMUSCULAR | Status: DC | PRN
  Administered 2014-01-22 (×2): 5 mg via INTRAVENOUS

## 2014-01-22 MED ORDER — ONDANSETRON HCL 4 MG/2ML IJ SOLN
INTRAMUSCULAR | Status: DC | PRN
  Administered 2014-01-22: 4 mg via INTRAVENOUS

## 2014-01-22 MED ORDER — TOBRAMYCIN-DEXAMETHASONE 0.3-0.1 % OP SUSP
OPHTHALMIC | Status: AC
  Filled 2014-01-22: qty 2.5

## 2014-01-22 MED ORDER — MIDAZOLAM HCL 2 MG/2ML IJ SOLN: 1.0000 mg | INTRAMUSCULAR | Status: DC | PRN

## 2014-01-22 MED ORDER — LIDOCAINE HCL (CARDIAC) 20 MG/ML IV SOLN
INTRAVENOUS | Status: DC | PRN
  Administered 2014-01-22: 50 mg via INTRAVENOUS

## 2014-01-22 MED ORDER — MIDAZOLAM HCL 2 MG/2ML IJ SOLN
INTRAMUSCULAR | Status: AC
  Filled 2014-01-22: qty 2

## 2014-01-22 MED ORDER — FENTANYL CITRATE 0.05 MG/ML IJ SOLN
50.0000 ug | INTRAMUSCULAR | Status: DC | PRN
  Filled 2014-01-22: qty 2

## 2014-01-22 SURGICAL SUPPLY — 31 items
APL SRG 3 HI ABS STRL LF PLS (MISCELLANEOUS) ×1
APPLICATOR COTTON TIP 6IN STRL (MISCELLANEOUS) ×12 IMPLANT
APPLICATOR DR MATTHEWS STRL (MISCELLANEOUS) ×3 IMPLANT
BANDAGE EYE OVAL (MISCELLANEOUS) ×4 IMPLANT
CAUTERY EYE LOW TEMP 1300F FIN (OPHTHALMIC RELATED) IMPLANT
CLOSURE WOUND 1/4X4 (GAUZE/BANDAGES/DRESSINGS)
COVER MAYO STAND STRL (DRAPES) ×3 IMPLANT
COVER TABLE BACK 60X90 (DRAPES) ×3 IMPLANT
DRAPE SURG 17X23 STRL (DRAPES) ×6 IMPLANT
DRAPE U-SHAPE 76X120 STRL (DRAPES) IMPLANT
GLOVE BIO SURGEON STRL SZ 6.5 (GLOVE) ×2 IMPLANT
GLOVE BIO SURGEONS STRL SZ 6.5 (GLOVE) ×1
GLOVE BIOGEL M STRL SZ7.5 (GLOVE) ×6 IMPLANT
GOWN STRL REUS W/ TWL LRG LVL3 (GOWN DISPOSABLE) ×1 IMPLANT
GOWN STRL REUS W/TWL LRG LVL3 (GOWN DISPOSABLE) ×3
GOWN STRL REUS W/TWL XL LVL3 (GOWN DISPOSABLE) ×3 IMPLANT
NS IRRIG 1000ML POUR BTL (IV SOLUTION) ×3 IMPLANT
PACK BASIN DAY SURGERY FS (CUSTOM PROCEDURE TRAY) ×3 IMPLANT
SHEET MEDIUM DRAPE 40X70 STRL (DRAPES) IMPLANT
SPEAR EYE SURG WECK-CEL (MISCELLANEOUS) ×10 IMPLANT
STRIP CLOSURE SKIN 1/4X4 (GAUZE/BANDAGES/DRESSINGS) IMPLANT
SUT 6 0 SILK T G140 8DA (SUTURE) ×2 IMPLANT
SUT MERSILENE 6 0 S14 DA (SUTURE) IMPLANT
SUT PLAIN 6 0 TG1408 (SUTURE) ×4 IMPLANT
SUT SILK 4 0 C 3 735G (SUTURE) IMPLANT
SUT VICRYL 6 0 S 28 (SUTURE) ×4 IMPLANT
SUT VICRYL ABS 6-0 S29 18IN (SUTURE) ×2 IMPLANT
SYRINGE 10CC LL (SYRINGE) ×3 IMPLANT
TOWEL OR 17X24 6PK STRL BLUE (TOWEL DISPOSABLE) ×3 IMPLANT
TOWEL OR NON WOVEN STRL DISP B (DISPOSABLE) ×3 IMPLANT
TRAY DSU PREP LF (CUSTOM PROCEDURE TRAY) ×3 IMPLANT

## 2014-01-22 NOTE — Anesthesia Preprocedure Evaluation (Signed)
Anesthesia Evaluation  Patient identified by MRN, date of birth, ID band Patient awake    Reviewed: Allergy & Precautions, H&P , NPO status , Patient's Chart, lab work & pertinent test results  Airway Mallampati: II  Neck ROM: full    Dental   Pulmonary          Cardiovascular hypertension,     Neuro/Psych  Headaches, Depression  Neuromuscular disease    GI/Hepatic   Endo/Other  Hyperthyroidism Morbid obesity  Renal/GU      Musculoskeletal   Abdominal   Peds  Hematology  (+) HIV,   Anesthesia Other Findings   Reproductive/Obstetrics                           Anesthesia Physical Anesthesia Plan  ASA: II  Anesthesia Plan: General   Post-op Pain Management:    Induction: Intravenous  Airway Management Planned: LMA  Additional Equipment:   Intra-op Plan:   Post-operative Plan:   Informed Consent: I have reviewed the patients History and Physical, chart, labs and discussed the procedure including the risks, benefits and alternatives for the proposed anesthesia with the patient or authorized representative who has indicated his/her understanding and acceptance.     Plan Discussed with: CRNA, Anesthesiologist and Surgeon  Anesthesia Plan Comments:         Anesthesia Quick Evaluation

## 2014-01-22 NOTE — Anesthesia Postprocedure Evaluation (Signed)
Anesthesia Post Note  Patient: Diane Sutton  Procedure(s) Performed: Procedure(s) (LRB): REPAIR STRABISMUS BILATERAL EYES (Bilateral)  Anesthesia type: General  Patient location: PACU  Post pain: Pain level controlled and Adequate analgesia  Post assessment: Post-op Vital signs reviewed, Patient's Cardiovascular Status Stable, Respiratory Function Stable, Patent Airway and Pain level controlled  Last Vitals:  Filed Vitals:   01/22/14 1215  BP:   Pulse: 111  Temp:   Resp: 18    Post vital signs: Reviewed and stable  Level of consciousness: awake, alert  and oriented  Complications: No apparent anesthesia complications

## 2014-01-22 NOTE — Op Note (Signed)
01/22/2014  11:09 AM  PATIENT:  Diane Sutton  58 y.o. female  PRE-OPERATIVE DIAGNOSIS: 1. "A" pattern exotropia, recurrent      2. Conjunctival scar, left eye  POST-OPERATIVE DIAGNOSIS: same  PROCEDURE: 1. Lateral rectus muscle recession 11.0 mm right eye   2.  Medial rectus muscle resection 7.5 mm right eye   3. Superior oblique tenotomy each eye   4. Lateral rectus muscle re-recession, 5.0 mm, left eye   5. Revision of conjunctival scar, left eye  SURGEON:  Lorne Skeens.Annamaria Boots, M.D.   ANESTHESIA:   general  COMPLICATIONS:None  DESCRIPTION OF PROCEDURE: The patient was taken to the operating room where She was identified by me. General anesthesia was induced without difficulty after placement of appropriate monitors. Exaggerated forced traction testing was carried out, confirming tightness of both superior oblique tendons. The patient was prepped and draped in standard sterile fashion. A lid speculum was placed in the right eye.  Through an inferotemporal fornix incision through conjunctiva and Tenon's fascia, the right lateral rectus muscle was engaged on a series of muscle hooks and cleared of its fascial attachments. The tendon was secured with a double-armed 6-0 Vicryl suture with a double locking bite at each border of the muscle, 1 mm from the insertion. The muscle was disinserted, and was reattached to sclera at a measured distance of 11.0 millimeters posterior to the original insertion, using direct scleral passes in crossed swords fashion.  The suture ends were tied securely after the position of the muscle had been checked and found to be accurate. Conjunctiva was closed with 2 6-0 plain gut sutures.  Through an inferonasal fornix incision through conjunctiva and Tenon fascia, the right medial rectus muscle was engaged on a series of muscle hooks and carefully cleared of its surrounding fascial attachments to at least 15 mm posterior to the insertion. The muscle was spread between 2  self-retaining hooks. A 2 mm bite was taken of the center of the muscle belly at a measured distance of 7.5 mm posterior to the original insertion, and a knot was tied securely at this location. The needle at each end of the double-armed 6-0 Vicryl suture was passed from the center of the muscle belly to the periphery, parallel to and 6.0 mm posterior to the insertion. A double locking bite was placed at each border of the muscle. A resection clamp was placed on the muscle just anterior to the sutures. The muscle was disinserted. Each pole suture was passed posteriorly to anteriorly through the corresponding end of the muscle stump, then anteriorly to posteriorly near the center of the stump, then posteriorly cannulated the center of the muscle belly, just posterior to the previously placed knot. The muscle was drawn up to the level of the original insertion, and all slack was removed before the suture ends were tied securely. The clamp was removed. The portion of the muscle anterior to the sutures was carefully excised. Conjunctiva was closed with 2 6-0 plain gut sutures. Through a superonasal fornix incision through conjunctiva and Tenon fashion with the right superior rectus muscle was engaged on a series of muscle hooks. A Barbee retractor was introduced into the conjunctival incision and drawn posteriorly, exposing the superior oblique tendon along the nasal border of the superior rectus muscle. The tendon was engaged on oblique hook and drawn forward, taking care to ensure that all fibers had been engaged. The tendon was severed with Wescott scissors. Exaggerated forced traction testing was repeated and found to be  free. Conjunctiva was closed with 2 6-0 plain gut sutures.  The speculum was transferred to the left eye, where a superior oblique tenotomy via a superonasal incision was performed, just as described for the left eye.  The temporal conjunctiva was noted to be folded and redundant along the limbus,  especially supratemporally. I elected to create a limbal based conjunctival flap and resected the anterior 7 mm of this flap with its redundant conjunctiva and scar tissue.  Relaxing incisions were created in the remaining flap in the superior temporal and inferotemporal quadrants. The left lateral rectus muscle was engaged on a series of muscle hooks and carefully cleared of its surrounding fascial attachments and scar tissue, taking care not to engage the inferior oblique tendon which was near the inferior border of this previously recessed lateral rectus muscle. The muscle was found inserted approximately 14 mm posterior to the limbus. The tendon was secured with a double-armed 6-0 Vicryl suture, with a double locking bite at each border of the muscle, 1 mm from the insertion. The muscle was disinserted, and was reattached to sclera at a measured distance of 5.0 mm posterior to the current insertion, using direct scleral passes in crossed swords fashion. The suture ends were tied securely. The conjunctival flap was reapposed, with scleral anchoring bites, approximately 7 mm posterior to the limbus, with 6-0 plain gut sutures. The relaxing incisions were also closed with 6-0 plain gut sutures.  TobraDex ointment was placed in each eye. The patient was awakened without difficulty and taken to the recovery room in stable condition, having suffered no intraoperative or immediate postoperative complications.  Lorne Skeens. Fany Cavanaugh M.D.    PATIENT DISPOSITION:  PACU - hemodynamically stable.

## 2014-01-22 NOTE — Transfer of Care (Signed)
Immediate Anesthesia Transfer of Care Note  Patient: Diane Sutton  Procedure(s) Performed: Procedure(s): REPAIR STRABISMUS BILATERAL EYES (Bilateral)  Patient Location: PACU  Anesthesia Type:General  Level of Consciousness: awake and alert   Airway & Oxygen Therapy: Patient Spontanous Breathing and Patient connected to face mask oxygen  Post-op Assessment: Report given to PACU RN and Post -op Vital signs reviewed and stable  Post vital signs: Reviewed and stable  Complications: No apparent anesthesia complications

## 2014-01-22 NOTE — Anesthesia Procedure Notes (Signed)
Procedure Name: LMA Insertion Performed by: Ethell Blatchford W Pre-anesthesia Checklist: Patient identified, Timeout performed, Emergency Drugs available, Suction available and Patient being monitored Patient Re-evaluated:Patient Re-evaluated prior to inductionOxygen Delivery Method: Circle system utilized Preoxygenation: Pre-oxygenation with 100% oxygen Intubation Type: IV induction Ventilation: Mask ventilation without difficulty LMA: LMA flexible inserted LMA Size: 4.0 Number of attempts: 1 Placement Confirmation: breath sounds checked- equal and bilateral and positive ETCO2 Tube secured with: Tape Dental Injury: Teeth and Oropharynx as per pre-operative assessment      

## 2014-01-22 NOTE — Discharge Instructions (Signed)
Diet: Clear liquids, advance to soft foods then regular diet as tolerated.  Pain control:   1)  Ibuprofen 600 mg by mouth every 6-8 hours as needed for pain  2)  Percocet 7.5/325 one or two by mouth as needed every 4-6 hours as needed for pain that is not resolved by ibuprofen  Eye medications:  Tobradex or Zylet eye ointment  1/2 inch  in the operated eye(s) twice a day for one week  Activity: No swimming for 1 week.  It is OK to let water run over the face and eyes while showering or taking a bath, even during the first week.  No other restriction on exercise or activity.  Call Dr. Janee Morn office [phone] with any problems or concerns.   Post Anesthesia Home Care Instructions  Activity: Get plenty of rest for the remainder of the day. A responsible adult should stay with you for 24 hours following the procedure.  For the next 24 hours, DO NOT: -Drive a car -Paediatric nurse -Drink alcoholic beverages -Take any medication unless instructed by your physician -Make any legal decisions or sign important papers.  Meals: Start with liquid foods such as gelatin or soup. Progress to regular foods as tolerated. Avoid greasy, spicy, heavy foods. If nausea and/or vomiting occur, drink only clear liquids until the nausea and/or vomiting subsides. Call your physician if vomiting continues.  Special Instructions/Symptoms: Your throat may feel dry or sore from the anesthesia or the breathing tube placed in your throat during surgery. If this causes discomfort, gargle with warm salt water. The discomfort should disappear within 24 hours.

## 2014-01-25 ENCOUNTER — Encounter (HOSPITAL_BASED_OUTPATIENT_CLINIC_OR_DEPARTMENT_OTHER): Payer: Self-pay | Admitting: Ophthalmology

## 2014-07-14 ENCOUNTER — Other Ambulatory Visit (HOSPITAL_COMMUNITY): Payer: Self-pay | Admitting: Family Medicine

## 2014-07-14 DIAGNOSIS — Z1231 Encounter for screening mammogram for malignant neoplasm of breast: Secondary | ICD-10-CM

## 2014-07-20 ENCOUNTER — Encounter: Payer: Self-pay | Admitting: Gastroenterology

## 2014-08-16 ENCOUNTER — Ambulatory Visit (HOSPITAL_COMMUNITY)
Admission: RE | Admit: 2014-08-16 | Discharge: 2014-08-16 | Disposition: A | Discharge status: Home / Self Care | Payer: Medicaid Other | Source: Ambulatory Visit | Attending: Family Medicine | Admitting: Family Medicine

## 2014-08-16 DIAGNOSIS — Z1231 Encounter for screening mammogram for malignant neoplasm of breast: Secondary | ICD-10-CM | POA: Diagnosis not present

## 2014-08-18 ENCOUNTER — Other Ambulatory Visit: Payer: Self-pay | Admitting: Family Medicine

## 2014-08-18 DIAGNOSIS — R928 Other abnormal and inconclusive findings on diagnostic imaging of breast: Secondary | ICD-10-CM

## 2014-08-24 ENCOUNTER — Ambulatory Visit
Admission: RE | Admit: 2014-08-24 | Discharge: 2014-08-24 | Disposition: A | Discharge status: Home / Self Care | Payer: Medicaid Other | Source: Ambulatory Visit | Attending: Family Medicine | Admitting: Family Medicine

## 2014-08-24 DIAGNOSIS — R928 Other abnormal and inconclusive findings on diagnostic imaging of breast: Secondary | ICD-10-CM

## 2014-09-10 ENCOUNTER — Ambulatory Visit (AMBULATORY_SURGERY_CENTER): Payer: Self-pay | Admitting: *Deleted

## 2014-09-10 VITALS — Ht 66.0 in | Wt 274.8 lb

## 2014-09-10 DIAGNOSIS — Z1211 Encounter for screening for malignant neoplasm of colon: Secondary | ICD-10-CM

## 2014-09-10 MED ORDER — NA SULFATE-K SULFATE-MG SULF 17.5-3.13-1.6 GM/177ML PO SOLN: ORAL | Status: DC

## 2014-09-10 NOTE — Progress Notes (Signed)
No allergies to eggs or soy. No problems with anesthesia.  Pt not given Emmi instructions for colonoscopy; no computer access  No oxygen use  No diet drug use  

## 2014-09-24 ENCOUNTER — Encounter: Payer: Self-pay | Admitting: Gastroenterology

## 2014-09-24 ENCOUNTER — Ambulatory Visit (AMBULATORY_SURGERY_CENTER): Payer: Medicaid Other | Admitting: Gastroenterology

## 2014-09-24 VITALS — BP 122/75 | HR 70 | Temp 96.3°F | Resp 27 | Ht 66.0 in | Wt 274.0 lb

## 2014-09-24 DIAGNOSIS — Z1211 Encounter for screening for malignant neoplasm of colon: Secondary | ICD-10-CM

## 2014-09-24 MED ORDER — SODIUM CHLORIDE 0.9 % IV SOLN: 500.0000 mL | INTRAVENOUS | Status: DC

## 2014-09-24 MED ORDER — FLEET ENEMA 7-19 GM/118ML RE ENEM
1.0000 | ENEMA | Freq: Once | RECTAL | Status: AC
  Administered 2014-09-24: 1 via RECTAL

## 2014-09-24 NOTE — Progress Notes (Signed)
Report to PACU, RN, vss, BBS= Clear.  

## 2014-09-24 NOTE — Progress Notes (Signed)
PT. Ate bacon and potatoes salad yesterday and stated that when she goes to bathroom it is brownish and yellow. Dr. Deatra Ina made aware enema given x1 and results cloudy.

## 2014-09-24 NOTE — Patient Instructions (Addendum)
YOU HAD AN ENDOSCOPIC PROCEDURE TODAY AT THE [facility]: Refer to the procedure report that was given to you for any specific questions about what was found during the examination.  If the procedure report does not answer your questions, please call your gastroenterologist to clarify.  If you requested that your care partner not be given the details of your procedure findings, then the procedure report has been included in a sealed envelope for you to review at your convenience later.  YOU SHOULD EXPECT: Some feelings of bloating in the abdomen. Passage of more gas than usual.  Walking can help get rid of the air that was put into your GI tract during the procedure and reduce the bloating. If you had a lower endoscopy (such as a colonoscopy or flexible sigmoidoscopy) you may notice spotting of blood in your stool or on the toilet paper. If you underwent a bowel prep for your procedure, then you may not have a normal bowel movement for a few days.  DIET: Your first meal following the procedure should be a light meal and then it is ok to progress to your normal diet.  A half-sandwich or bowl of soup is an example of a good first meal.  Heavy or fried foods are harder to digest and may make you feel nauseous or bloated.  Likewise meals heavy in dairy and vegetables can cause extra gas to form and this can also increase the bloating.  Drink plenty of fluids but you should avoid alcoholic beverages for 24 hours.  ACTIVITY: Your care partner should take you home directly after the procedure.  You should plan to take it easy, moving slowly for the rest of the day.  You can resume normal activity the day after the procedure however you should NOT DRIVE or use heavy machinery for 24 hours (because of the sedation medicines used during the test).    SYMPTOMS TO REPORT IMMEDIATELY: A gastroenterologist can be reached at any hour.  During normal business hours, 8:30 AM to 5:00 PM Monday through Friday,  call [phone].  After hours and on weekends, please call the GI answering service at [phone] who will take a message and have the physician on call contact you.   Following upper endoscopy (EGD)  Vomiting of blood or coffee ground material  New chest pain or pain under the shoulder blades  Painful or persistently difficult swallowing  New shortness of breath  Fever of 100F or higher  Black, tarry-looking stools  FOLLOW UP: If any biopsies were taken you will be contacted by phone or by letter within the next 1-3 weeks.  Call your gastroenterologist if you have not heard about the biopsies in 3 weeks.  Our staff will call the home number listed on your records the next business day following your procedure to check on you and address any questions or concerns that you may have at that time regarding the information given to you following your procedure. This is a courtesy call and so if there is no answer at the home number and we have not heard from you through the emergency physician on call, we will assume that you have returned to your regular daily activities without incident.  SIGNATURES/CONFIDENTIALITY: You and/or your care partner have signed paperwork which will be entered into your electronic medical record.  These signatures attest to the fact that that the information above on your After Visit Summary has been reviewed and is understood.  Full responsibility   of the confidentiality of this discharge information lies with you and/or your care-partner.YOU HAD AN ENDOSCOPIC PROCEDURE TODAY AT [facility]: Refer to the procedure report that was given to you for any specific questions about what was found during the examination.  If the procedure report does not answer your questions, please call your gastroenterologist to clarify.  If you requested that your care partner not be given the details of your procedure findings, then the procedure report has been  included in a sealed envelope for you to review at your convenience later.  YOU SHOULD EXPECT: Some feelings of bloating in the abdomen. Passage of more gas than usual.  Walking can help get rid of the air that was put into your GI tract during the procedure and reduce the bloating. If you had a lower endoscopy (such as a colonoscopy or flexible sigmoidoscopy) you may notice spotting of blood in your stool or on the toilet paper. If you underwent a bowel prep for your procedure, then you may not have a normal bowel movement for a few days.  DIET: Your first meal following the procedure should be a light meal and then it is ok to progress to your normal diet.  A half-sandwich or bowl of soup is an example of a good first meal.  Heavy or fried foods are harder to digest and may make you feel nauseous or bloated.  Likewise meals heavy in dairy and vegetables can cause extra gas to form and this can also increase the bloating.  Drink plenty of fluids but you should avoid alcoholic beverages for 24 hours.  ACTIVITY: Your care partner should take you home directly after the procedure.  You should plan to take it easy, moving slowly for the rest of the day.  You can resume normal activity the day after the procedure however you should NOT DRIVE or use heavy machinery for 24 hours (because of the sedation medicines used during the test).    SYMPTOMS TO REPORT IMMEDIATELY: A gastroenterologist can be reached at any hour.  During normal business hours, 8:30 AM to 5:00 PM Monday through Friday, call [phone].  After hours and on weekends, please call the GI answering service at [phone] who will take a message and have the physician on call contact you.   Following lower endoscopy (colonoscopy or flexible sigmoidoscopy):  Excessive amounts of blood in the stool  Significant tenderness or worsening of abdominal pains  Swelling of the abdomen that is new, acute  Fever of 100F or higher  FOLLOW  UP: If any biopsies were taken you will be contacted by phone or by letter within the next 1-3 weeks.  Call your gastroenterologist if you have not heard about the biopsies in 3 weeks.  Our staff will call the home number listed on your records the next business day following your procedure to check on you and address any questions or concerns that you may have at that time regarding the information given to you following your procedure. This is a courtesy call and so if there is no answer at the home number and we have not heard from you through the emergency physician on call, we will assume that you have returned to your regular daily activities without incident.  SIGNATURES/CONFIDENTIALITY: You and/or your care partner have signed paperwork which will be entered into your electronic medical record.  These signatures attest to the fact that that the information above on your After Visit Summary has  been reviewed and is understood.  Full responsibility of the confidentiality of this discharge information lies with you and/or your care-partner.

## 2014-09-24 NOTE — Op Note (Signed)
[facility]  [address] [place], [id number]   COLONOSCOPY PROCEDURE REPORT  PATIENT: Diane Sutton, Diane Sutton  MR#: [id number] BIRTHDATE: Sep 27, 1956 , 58  yrs. old GENDER: female ENDOSCOPIST: Inda Castle, MD REFERRED BY: PROCEDURE DATE:  09/24/2014 PROCEDURE:   Colonoscopy, diagnostic First Screening Colonoscopy - Avg.  risk and is 50 yrs.  old or older Yes.  Prior Negative Screening - Now for repeat screening. N/A  History of Adenoma - Now for follow-up colonoscopy & has been > or = to 3 yrs.  N/A  Polyps Removed Today? No.  Recommend repeat exam, <10 yrs? No. ASA CLASS:   Class II INDICATIONS:average risk for colon cancer. MEDICATIONS: Monitored anesthesia care and Propofol 220 mg IV  DESCRIPTION OF PROCEDURE:   After the risks benefits and alternatives of the procedure were thoroughly explained, informed consent was obtained.  The digital rectal exam revealed no abnormalities of the rectum.   The [id number]  endoscope was introduced through the anus and advanced to the cecum, which was identified by both the appendix and ileocecal valve. No adverse events experienced.   The quality of the prep was excellent using Suprep  The instrument was then slowly withdrawn as the colon was fully examined.      COLON FINDINGS: A normal appearing cecum, ileocecal valve, and appendiceal orifice were identified.  The ascending, transverse, descending, sigmoid colon, and rectum appeared unremarkable. Retroflexed views revealed no abnormalities. The time to cecum=3 minutes 06 seconds.  Withdrawal time=7 minutes 06 seconds.  The scope was withdrawn and the procedure completed. COMPLICATIONS: There were no complications.  ENDOSCOPIC IMPRESSION: Normal colonoscopy  RECOMMENDATIONS: Continue current colorectal screening recommendations for "routine risk" patients with a repeat colonoscopy in 10 years.  eSigned:  Inda Castle, MD 09/24/2014 1:51  PM   cc:   PATIENT NAME:  Diane Sutton, Diane Sutton MR#: [id number]

## 2014-09-27 ENCOUNTER — Telehealth: Payer: Self-pay | Admitting: *Deleted

## 2014-09-27 NOTE — Telephone Encounter (Signed)
  Follow up Call-  Call back number 09/24/2014  Post procedure Call Back phone  # [phone] SISTER NUMBER  Permission to leave phone message Yes     Patient questions:  Left message to call us if necessary.

## 2014-10-05 ENCOUNTER — Other Ambulatory Visit: Payer: Self-pay | Admitting: Family Medicine

## 2014-10-05 DIAGNOSIS — N63 Unspecified lump in unspecified breast: Secondary | ICD-10-CM

## 2014-10-13 ENCOUNTER — Other Ambulatory Visit: Payer: Medicaid Other

## 2015-07-25 ENCOUNTER — Other Ambulatory Visit: Payer: Self-pay | Admitting: Internal Medicine

## 2015-07-25 DIAGNOSIS — R946 Abnormal results of thyroid function studies: Secondary | ICD-10-CM

## 2015-08-01 ENCOUNTER — Encounter (HOSPITAL_COMMUNITY): Payer: Self-pay | Admitting: *Deleted

## 2015-08-01 ENCOUNTER — Emergency Department (HOSPITAL_COMMUNITY)
Admission: EM | Admit: 2015-08-01 | Discharge: 2015-08-01 | Disposition: A | Discharge status: Home / Self Care | Payer: Medicaid Other | Attending: Emergency Medicine | Admitting: Emergency Medicine

## 2015-08-01 DIAGNOSIS — T300 Burn of unspecified body region, unspecified degree: Secondary | ICD-10-CM

## 2015-08-01 DIAGNOSIS — T22252D Burn of second degree of left shoulder, subsequent encounter: Secondary | ICD-10-CM | POA: Diagnosis not present

## 2015-08-01 DIAGNOSIS — F209 Schizophrenia, unspecified: Secondary | ICD-10-CM | POA: Diagnosis not present

## 2015-08-01 DIAGNOSIS — Z21 Asymptomatic human immunodeficiency virus [HIV] infection status: Secondary | ICD-10-CM | POA: Diagnosis not present

## 2015-08-01 DIAGNOSIS — Z760 Encounter for issue of repeat prescription: Secondary | ICD-10-CM | POA: Diagnosis present

## 2015-08-01 DIAGNOSIS — I10 Essential (primary) hypertension: Secondary | ICD-10-CM | POA: Diagnosis not present

## 2015-08-01 DIAGNOSIS — Z79899 Other long term (current) drug therapy: Secondary | ICD-10-CM | POA: Insufficient documentation

## 2015-08-01 DIAGNOSIS — F329 Major depressive disorder, single episode, unspecified: Secondary | ICD-10-CM | POA: Diagnosis not present

## 2015-08-01 DIAGNOSIS — X158XXD Contact with other hot household appliances, subsequent encounter: Secondary | ICD-10-CM | POA: Diagnosis not present

## 2015-08-01 MED ORDER — QUETIAPINE FUMARATE 200 MG PO TABS: 200.0000 mg | ORAL_TABLET | Freq: Every day | ORAL | Status: DC

## 2015-08-01 MED ORDER — ESCITALOPRAM OXALATE 20 MG PO TABS: 20.0000 mg | ORAL_TABLET | Freq: Every day | ORAL | Status: DC

## 2015-08-01 NOTE — Discharge Instructions (Signed)
Burn Care Your skin is a natural barrier to infection. It is the largest organ of your body. Burns damage this natural protection. To help prevent infection, it is very important to follow your caregiver's instructions in the care of your burn. Burns are classified as:  First degree. There is only redness of the skin (erythema). No scarring is expected.  Second degree. There is blistering of the skin. Scarring may occur with deeper burns.  Third degree. All layers of the skin are injured, and scarring is expected. HOME CARE INSTRUCTIONS   Wash your hands well before changing your bandage.  Change your bandage as often as directed by your caregiver.  Remove the old bandage. If the bandage sticks, you may soak it off with cool, clean water.  Cleanse the burn thoroughly but gently with mild soap and water.  Pat the area dry with a clean, dry cloth.  Apply a thin layer of antibacterial cream to the burn.  Apply a clean bandage as instructed by your caregiver.  Keep the bandage as clean and dry as possible.  Elevate the affected area for the first 24 hours, then as instructed by your caregiver.  Only take over-the-counter or prescription medicines for pain, discomfort, or fever as directed by your caregiver. SEEK IMMEDIATE MEDICAL CARE IF:   You develop excessive pain.  You develop redness, tenderness, swelling, or red streaks near the burn.  The burned area develops yellowish-white fluid (pus) or a bad smell.  You have a fever. MAKE SURE YOU:   Understand these instructions.  Will watch your condition.  Will get help right away if you are not doing well or get worse. Document Released: 12/17/2005 Document Revised: 03/10/2012 Document Reviewed: 05/09/2011 [facility] Patient Information 2015 [place], [place]. This information is not intended to replace advice given to you by your health care provider. Make sure you discuss any questions you have with your health care  provider.  Medication Refill, Emergency Department We have refilled your medication today as a courtesy to you. It is best for your medical care, however, to take care of getting refills done through your primary caregiver's office. They have your records and can do a better job of follow-up than we can in the emergency department. On maintenance medications, we often only prescribe enough medications to get you by until you are able to see your regular caregiver. This is a more expensive way to refill medications. In the future, please plan for refills so that you will not have to use the emergency department for this. Thank you for your help. Your help allows [place] to better take care of the daily emergencies that enter our department. Document Released: 04/04/2004 Document Revised: 03/10/2012 Document Reviewed: 03/26/2014 [facility] Patient Information 2015 [place], [place]. This information is not intended to replace advice given to you by your health care provider. Make sure you discuss any questions you have with your health care provider.

## 2015-08-01 NOTE — ED Notes (Signed)
Pt in requesting medication refill of her Seroquel, states she gets it from [facility] and her MD is out of town, also has a burn to her left shoulder from her curling iron that happened last week, states she would like it looked at, no distress noted

## 2015-08-01 NOTE — ED Provider Notes (Signed)
CSN: [id number]     Arrival date & time 08/01/15  1356 History  This chart was scribed for non-physician practitioner Margarita Mail, PA-C working with Orlie Dakin, MD by Zola Button, ED Scribe. This patient was seen in room TR07C/TR07C and the patient's care was started at 2:48 PM.      Chief Complaint  Patient presents with  . Medication Refill  . Burn   The history is provided by the patient. No language interpreter was used.    HPI Comments: Diane Sutton is a 59 y.o. female with a history of schizophrenia and depression who presents to the Emergency Department for a medication refill for Seroquel and Lexapro. Her PCP is out-of-town and is unable to have her medications filled by her PCP.  Patient also reports having a burn to her shoulder from her curling iron 1 week ago. She still has some pain to the area.  Past Medical History  Diagnosis Date  . HIV (human immunodeficiency virus infection)     positive-non active  . Hypertension   . Depression   . Schizophrenia    Past Surgical History  Procedure Laterality Date  . Abdominal hysterectomy    . Thyroid surgery      needle tx  . Ankle surgery  2004    orif left  . Finger surgery  2004    i/d lt middle finger  . Strabismus surgery Left 12/11/2007  . Strabismus surgery Bilateral 01/22/2014    Procedure: REPAIR STRABISMUS BILATERAL EYES;  Surgeon: Derry Skill, MD;  Location: [facility];  Service: Ophthalmology;  Laterality: Bilateral;   Family History  Problem Relation Age of Onset  . Colon cancer Neg Hx   . Heart disease Mother    History  Substance Use Topics  . Smoking status: Never Smoker   . Smokeless tobacco: Former Systems developer  . Alcohol Use: No   OB History    No data available     Review of Systems  Constitutional: Negative for fever.  Skin: Positive for wound.      Allergies  Review of patient's allergies indicates no known allergies.  Home Medications   Prior to Admission  medications   Medication Sig Start Date End Date Taking? Authorizing Provider  escitalopram (LEXAPRO) 20 MG tablet Take 20 mg by mouth daily.    Historical Provider, MD  loratadine (CLARITIN) 10 MG tablet Take 10 mg by mouth daily.    Historical Provider, MD  QUEtiapine (SEROQUEL) 200 MG tablet Take 200 mg by mouth at bedtime.    Historical Provider, MD  simvastatin (ZOCOR) 10 MG tablet Take 10 mg by mouth daily.    Historical Provider, MD   BP 133/81 mmHg  Pulse 87  Temp(Src) 97.9 F (36.6 C) (Oral)  Resp 20  Wt 272 lb (123.378 kg)  SpO2 100% Physical Exam  Constitutional: She is oriented to person, place, and time. She appears well-developed and well-nourished. No distress.  HENT:  Head: Normocephalic and atraumatic.  Mouth/Throat: Oropharynx is clear and moist. No oropharyngeal exudate.  Eyes: Pupils are equal, round, and reactive to light.  Neck: Neck supple.  Cardiovascular: Normal rate.   Pulmonary/Chest: Effort normal.  Musculoskeletal: She exhibits no edema.  Neurological: She is alert and oriented to person, place, and time. No cranial nerve deficit.  Skin: Skin is warm and dry. No rash noted.  Well healing 3 x 4 cm superficial 2nd degree burn to her left shoulder. Appears to be without infection.  Psychiatric:  She has a normal mood and affect. Her behavior is normal.  Nursing note and vitals reviewed.   ED Course  Procedures  DIAGNOSTIC STUDIES: Oxygen Saturation is 100% on room air, normal by my interpretation.    COORDINATION OF CARE: 2:53 PM-Discussed treatment plan which includes refill of her medications with patient/guardian at bedside and patient/guardian agreed to plan.    Labs Review Labs Reviewed - No data to display  Imaging Review No results found.   EKG Interpretation None      MDM   Final diagnoses:  Burn  Encounter for medication refill    The patient appears reasonably screened and/or stabilized for discharge and I doubt any other  medical condition or other [facility] requiring further screening, evaluation, or treatment in the ED at this time prior to discharge.   I personally performed the services described in this documentation, which was scribed in my presence. The recorded information has been reviewed and is accurate.       Margarita Mail, PA-C 08/01/15 [place], MD 08/01/15 1750

## 2015-08-08 ENCOUNTER — Encounter (HOSPITAL_COMMUNITY)
Admission: RE | Admit: 2015-08-08 | Discharge: 2015-08-08 | Disposition: A | Discharge status: Home / Self Care | Payer: Medicaid Other | Source: Ambulatory Visit | Attending: Internal Medicine | Admitting: Internal Medicine

## 2015-08-08 DIAGNOSIS — R946 Abnormal results of thyroid function studies: Secondary | ICD-10-CM

## 2015-08-08 MED ORDER — SODIUM IODIDE I 131 CAPSULE
11.6000 | Freq: Once | INTRAVENOUS | Status: AC | PRN
  Administered 2015-08-08: 11.6 via ORAL

## 2015-08-09 ENCOUNTER — Encounter (HOSPITAL_COMMUNITY)
Admission: RE | Admit: 2015-08-09 | Discharge: 2015-08-09 | Disposition: A | Discharge status: Home / Self Care | Payer: Medicaid Other | Source: Ambulatory Visit | Attending: Internal Medicine | Admitting: Internal Medicine

## 2015-08-09 DIAGNOSIS — R946 Abnormal results of thyroid function studies: Secondary | ICD-10-CM | POA: Diagnosis not present

## 2015-08-09 MED ORDER — SODIUM PERTECHNETATE TC 99M INJECTION
10.0000 | Freq: Once | INTRAVENOUS | Status: AC | PRN
  Administered 2015-08-09: 10 via INTRAVENOUS

## 2015-08-17 ENCOUNTER — Other Ambulatory Visit: Payer: Self-pay | Admitting: Internal Medicine

## 2015-08-18 ENCOUNTER — Other Ambulatory Visit: Payer: Self-pay | Admitting: Internal Medicine

## 2015-08-18 DIAGNOSIS — E0590 Thyrotoxicosis, unspecified without thyrotoxic crisis or storm: Secondary | ICD-10-CM

## 2015-08-25 ENCOUNTER — Ambulatory Visit (HOSPITAL_COMMUNITY)
Admission: RE | Admit: 2015-08-25 | Discharge: 2015-08-25 | Disposition: A | Discharge status: Home / Self Care | Payer: Medicaid Other | Source: Ambulatory Visit | Attending: Internal Medicine | Admitting: Internal Medicine

## 2015-08-25 DIAGNOSIS — E0590 Thyrotoxicosis, unspecified without thyrotoxic crisis or storm: Secondary | ICD-10-CM | POA: Diagnosis present

## 2015-08-25 MED ORDER — SODIUM IODIDE I 131 CAPSULE
29.7000 | Freq: Once | INTRAVENOUS | Status: AC | PRN
  Administered 2015-08-25: 29.7 via ORAL

## 2016-02-13 ENCOUNTER — Emergency Department (HOSPITAL_COMMUNITY)
Admission: EM | Admit: 2016-02-13 | Discharge: 2016-02-13 | Disposition: A | Discharge status: Home / Self Care | Payer: Medicaid Other | Source: Home / Self Care | Attending: Family Medicine | Admitting: Family Medicine

## 2016-02-13 ENCOUNTER — Encounter (HOSPITAL_COMMUNITY): Payer: Self-pay | Admitting: Emergency Medicine

## 2016-02-13 ENCOUNTER — Emergency Department (INDEPENDENT_AMBULATORY_CARE_PROVIDER_SITE_OTHER): Payer: Medicaid Other

## 2016-02-13 DIAGNOSIS — R0789 Other chest pain: Secondary | ICD-10-CM | POA: Diagnosis not present

## 2016-02-13 DIAGNOSIS — R0609 Other forms of dyspnea: Secondary | ICD-10-CM

## 2016-02-13 NOTE — Discharge Instructions (Signed)
Chest Pain Observation You need to obtain a doctor as soon as possible  See p.1 for an option. May also call [phone]. If you have additional chest pain, problems breathing or worsening go to the ED or call EMS. It is often hard to give a specific diagnosis for the cause of chest pain. Among other possibilities your symptoms might be caused by inadequate oxygen delivery to your heart (angina). Angina that is not treated or evaluated can lead to a heart attack (myocardial infarction) or death. Blood tests, electrocardiograms, and X-rays may have been done to help determine a possible cause of your chest pain. After evaluation and observation, your health care provider has determined that it is unlikely your pain was caused by an unstable condition that requires hospitalization. However, a full evaluation of your pain may need to be completed, with additional diagnostic testing as directed. It is very important to keep your follow-up appointments. Not keeping your follow-up appointments could result in permanent heart damage, disability, or death. If there is any problem keeping your follow-up appointments, you must call your health care provider. HOME CARE INSTRUCTIONS  Due to the slight chance that your pain could be angina, it is important to follow your health care provider's treatment plan and also maintain a healthy lifestyle:  Maintain or work toward achieving a healthy weight.  Stay physically active and exercise regularly.  Decrease your salt intake.  Eat a balanced, healthy diet. Talk to a dietitian to learn about heart-healthy foods.  Increase your fiber intake by including whole grains, vegetables, fruits, and nuts in your diet.  Avoid situations that cause stress, anger, or depression.  Take medicines as advised by your health care provider. Report any side effects to your health care provider. Do not stop medicines or adjust the dosages on your own.  Quit smoking. Do not use  nicotine patches or gum until you check with your health care provider.  Keep your blood pressure, blood sugar, and cholesterol levels within normal limits.  Limit alcohol intake to no more than 1 drink per day for women who are not pregnant and 2 drinks per day for men.  Do not abuse drugs. SEEK IMMEDIATE MEDICAL CARE IF: You have severe chest pain or pressure which may include symptoms such as:  You feel pain or pressure in your arms, neck, jaw, or back.  You have severe back or abdominal pain, feel sick to your stomach (nauseous), or throw up (vomit).  You are sweating profusely.  You are having a fast or irregular heartbeat.  You feel short of breath while at rest.  You notice increasing shortness of breath during rest, sleep, or with activity.  You have chest pain that does not get better after rest or after taking your usual medicine.  You wake from sleep with chest pain.  You are unable to sleep because you cannot breathe.  You develop a frequent cough or you are coughing up blood.  You feel dizzy, faint, or experience extreme fatigue.  You develop severe weakness, dizziness, fainting, or chills. Any of these symptoms may represent a serious problem that is an emergency. Do not wait to see if the symptoms will go away. Call your local emergency services (911 in the [place] Do not drive yourself to the hospital. MAKE SURE YOU:  Understand these instructions.  Will watch your condition.  Will get help right away if you are not doing well or get worse.   This information is not intended  to replace advice given to you by your health care provider. Make sure you discuss any questions you have with your health care provider.   Document Released: 01/19/2011 Document Revised: 12/22/2013 Document Reviewed: 06/18/2013 Elsevier Interactive Patient Education 2016 [employer]  Chest Wall Pain Chest wall pain is pain in or around the bones and muscles of your chest.  Sometimes, an injury causes this pain. Sometimes, the cause may not be known. This pain may take several weeks or longer to get better. HOME CARE INSTRUCTIONS  Pay attention to any changes in your symptoms. Take these actions to help with your pain:   Rest as told by your health care provider.   Avoid activities that cause pain. These include any activities that use your chest muscles or your abdominal and side muscles to lift heavy items.   If directed, apply ice to the painful area:  Put ice in a plastic bag.  Place a towel between your skin and the bag.  Leave the ice on for 20 minutes, 2-3 times per day.  Take over-the-counter and prescription medicines only as told by your health care provider.  Do not use tobacco products, including cigarettes, chewing tobacco, and e-cigarettes. If you need help quitting, ask your health care provider.  Keep all follow-up visits as told by your health care provider. This is important. SEEK MEDICAL CARE IF:  You have a fever.  Your chest pain becomes worse.  You have new symptoms. SEEK IMMEDIATE MEDICAL CARE IF:  You have nausea or vomiting.  You feel sweaty or light-headed.  You have a cough with phlegm (sputum) or you cough up blood.  You develop shortness of breath.   This information is not intended to replace advice given to you by your health care provider. Make sure you discuss any questions you have with your health care provider.   Document Released: 12/17/2005 Document Revised: 09/07/2015 Document Reviewed: 03/14/2015 Elsevier Interactive Patient Education 2016 [place] of Breath Shortness of breath means you have trouble breathing. It could also mean that you have a medical problem. You should get immediate medical care for shortness of breath. CAUSES   Not enough oxygen in the air such as with high altitudes or a smoke-filled room.  Certain lung diseases, infections, or problems.  Heart disease  or conditions, such as angina or heart failure.  Low red blood cells (anemia).  Poor physical fitness, which can cause shortness of breath when you exercise.  Chest or back injuries or stiffness.  Being overweight.  Smoking.  Anxiety, which can make you feel like you are not getting enough air. DIAGNOSIS  Serious medical problems can often be found during your physical exam. Tests may also be done to determine why you are having shortness of breath. Tests may include:  Chest X-rays.  Lung function tests.  Blood tests.  An electrocardiogram (ECG).  An ambulatory electrocardiogram. An ambulatory ECG records your heartbeat patterns over a 24-hour period.  Exercise testing.  A transthoracic echocardiogram (TTE). During echocardiography, sound waves are used to evaluate how blood flows through your heart.  A transesophageal echocardiogram (TEE).  Imaging scans. Your health care provider may not be able to find a cause for your shortness of breath after your exam. In this case, it is important to have a follow-up exam with your health care provider as directed.  TREATMENT  Treatment for shortness of breath depends on the cause of your symptoms and can vary greatly. HOME CARE INSTRUCTIONS  Do not smoke. Smoking is a common cause of shortness of breath. If you smoke, ask for help to quit.  Avoid being around chemicals or things that may bother your breathing, such as paint fumes and dust.  Rest as needed. Slowly resume your usual activities.  If medicines were prescribed, take them as directed for the full length of time directed. This includes oxygen and any inhaled medicines.  Keep all follow-up appointments as directed by your health care provider. SEEK MEDICAL CARE IF:   Your condition does not improve in the time expected.  You have a hard time doing your normal activities even with rest.  You have any new symptoms. SEEK IMMEDIATE MEDICAL CARE IF:   Your  shortness of breath gets worse.  You feel light-headed, faint, or develop a cough not controlled with medicines.  You start coughing up blood.  You have pain with breathing.  You have chest pain or pain in your arms, shoulders, or abdomen.  You have a fever.  You are unable to walk up stairs or exercise the way you normally do. MAKE SURE YOU:  Understand these instructions.  Will watch your condition.  Will get help right away if you are not doing well or get worse.   This information is not intended to replace advice given to you by your health care provider. Make sure you discuss any questions you have with your health care provider.   Document Released: 09/11/2001 Document Revised: 12/22/2013 Document Reviewed: 03/03/2012 Elsevier Interactive Patient Education [employer].

## 2016-02-13 NOTE — ED Notes (Signed)
Patient has multiple complaints.  Patient's pcp- dr Ollen Gross blount- died.  Patient does not have a pcp.  Patient complains of sob. Says she has had a history of this prior to now.  Reports this episode started 3 weeks ago.  At the onset of symptoms, she was getting over a cold.

## 2016-02-13 NOTE — ED Provider Notes (Signed)
CSN: [id number]     Arrival date & time 02/13/16  1354 History   First MD Initiated Contact with Patient 02/13/16 1603     Chief Complaint  Patient presents with  . Shortness of Breath   (Consider location/radiation/quality/duration/timing/severity/associated sxs/prior Treatment) HPI Comments: Pleasant Morbidly obese 60 year old female complaining of shortness of breath and chest pain. She is complaining primarily of dyspnea on exertion. She walks regularly and for the past 3 weeks has noticed that she can only walk a short distance without having to stop. Occasionally she has shortness of breath at rest. She also complains of right anterior chest pain. She describes it as a pressure like discomfort. It is episodic. Occasionally occurs when she is exerting herself at the same time she has shortness of breath but also occurs at rest. Denies nausea, vomiting or diaphoresis. Her last episode of DOE was this morning when she was walking. She denies edema. Her physician was Dr.Blount before he died. Currently she is stable. Has no chest discomfort or shortness of breath all in the exam room. Denies known cardiac or pulmonary history. Denies history of smoking. Her history does include hypertension, depression, HIV and schizophrenia.   Past Medical History  Diagnosis Date  . HIV (human immunodeficiency virus infection) ([place])     positive-non active  . Hypertension   . Depression   . Schizophrenia [facility])    Past Surgical History  Procedure Laterality Date  . Abdominal hysterectomy    . Thyroid surgery      needle tx  . Ankle surgery  2004    orif left  . Finger surgery  2004    i/d lt middle finger  . Strabismus surgery Left 12/11/2007  . Strabismus surgery Bilateral 01/22/2014    Procedure: REPAIR STRABISMUS BILATERAL EYES;  Surgeon: Derry Skill, MD;  Location: [facility];  Service: Ophthalmology;  Laterality: Bilateral;   Family History  Problem Relation Age of Onset   . Colon cancer Neg Hx   . Heart disease Mother    Social History  Substance Use Topics  . Smoking status: Never Smoker   . Smokeless tobacco: Former Systems developer  . Alcohol Use: No   OB History    No data available     Review of Systems  Constitutional: Positive for activity change. Negative for fever, appetite change and fatigue.  HENT: Negative.   Eyes: Negative.   Respiratory: Positive for shortness of breath. Negative for cough.   Cardiovascular: Positive for chest pain. Negative for palpitations and leg swelling.  Gastrointestinal: Negative.   Musculoskeletal: Negative.   Skin: Negative.   Neurological: Negative.   Psychiatric/Behavioral: Negative.   All other systems reviewed and are negative.   Allergies  Review of patient's allergies indicates no known allergies.  Home Medications   Prior to Admission medications   Medication Sig Start Date End Date Taking? Authorizing Provider  escitalopram (LEXAPRO) 20 MG tablet Take 1 tablet (20 mg total) by mouth daily. 08/01/15   Margarita Mail, PA-C  loratadine (CLARITIN) 10 MG tablet Take 10 mg by mouth daily.    Historical Provider, MD  QUEtiapine (SEROQUEL) 200 MG tablet Take 1 tablet (200 mg total) by mouth at bedtime. 08/01/15   Margarita Mail, PA-C  simvastatin (ZOCOR) 10 MG tablet Take 10 mg by mouth daily.    Historical Provider, MD   Meds Ordered and Administered this Visit  Medications - No data to display  BP 148/85 mmHg  Pulse 77  Temp(Src) 97.9  F (36.6 C) (Oral)  Resp 16  SpO2 99% No data found.   Physical Exam  Constitutional: She is oriented to person, place, and time. She appears well-developed and well-nourished. No distress.  Morbidly obese.  HENT:  Head: Normocephalic and atraumatic.  Mouth/Throat: Oropharynx is clear and moist.  Eyes: Conjunctivae and EOM are normal.  Neck: Normal range of motion. Neck supple.  Cardiovascular: Normal rate, regular rhythm, normal heart sounds and intact distal pulses.    Pulmonary/Chest: Effort normal and breath sounds normal. No respiratory distress. She has no wheezes. She has no rales. She exhibits tenderness.  Palpation of the right upper anterior chest produces local tenderness.  Musculoskeletal: Normal range of motion. She exhibits no edema or tenderness.  Lymphadenopathy:    She has no cervical adenopathy.  Neurological: She is alert and oriented to person, place, and time. She exhibits normal muscle tone.  Skin: Skin is warm and dry. She is not diaphoretic.  Psychiatric: She has a normal mood and affect. Her speech is normal and behavior is normal. Her mood appears not anxious. Her affect is not blunt and not labile.  Nursing note and vitals reviewed.   ED Course  Procedures (including critical care time)  Labs Review Labs Reviewed - No data to display  Imaging Review Dg Chest 2 View  02/13/2016  CLINICAL DATA:  Trouble breathing for by 3 weeks. EXAM: CHEST  2 VIEW COMPARISON:  11/27/2010 FINDINGS: Normal mediastinum and cardiac silhouette. Normal pulmonary vasculature. No evidence of effusion, infiltrate, or pneumothorax. No acute bony abnormality. Degenerative osteophytosis of the thoracic spine. IMPRESSION: No active cardiopulmonary disease. Electronically Signed   By: Suzy Bouchard M.D.   On: 02/13/2016 16:27    ED ECG REPORT   Date: 02/13/2016  Rate: 66  Rhythm: normal sinus rhythm  QRS Axis: normal  Intervals: normal  ST/T Wave abnormalities: normal  Conduction Disutrbances:none  Narrative Interpretation:   Old EKG Reviewed: none available  I have personally reviewed the EKG tracing and agree with the computerized printout as noted.  Visual Acuity Review  Right Eye Distance:   Left Eye Distance:   Bilateral Distance:    Right Eye Near:   Left Eye Near:    Bilateral Near:         MDM   1. DOE (dyspnea on exertion)   2. Chest wall pain    EKG and chest x-ray are normal. The patient is asymptomatic at this time.  Her chest pain is reproducible with manual palpation and is considered chest wall pain at this time. No evidence of edema. No signs of CHF. She is morbidly obese and this may be contributing to her dyspnea on exertion. She will also need referral to cardiology for additional testing. Resources to obtain a PCP R on her instructions and she needs to call them promptly. She is aware that if she gets worse has continued or new symptoms she should go to the emergency department or call EMS promptly. You need to obtain a doctor as soon as possible  See p.1 for an option. May also call [phone]. If you have additional chest pain, problems breathing or worsening go to the ED or call EMS.     Janne Napoleon, NP 02/13/16 1724

## 2016-03-31 ENCOUNTER — Emergency Department (HOSPITAL_COMMUNITY)
Admission: EM | Admit: 2016-03-31 | Discharge: 2016-03-31 | Disposition: A | Discharge status: Home / Self Care | Payer: Medicaid Other | Attending: Emergency Medicine | Admitting: Emergency Medicine

## 2016-03-31 ENCOUNTER — Encounter (HOSPITAL_COMMUNITY): Payer: Self-pay | Admitting: *Deleted

## 2016-03-31 DIAGNOSIS — Z79899 Other long term (current) drug therapy: Secondary | ICD-10-CM | POA: Insufficient documentation

## 2016-03-31 DIAGNOSIS — Z8639 Personal history of other endocrine, nutritional and metabolic disease: Secondary | ICD-10-CM | POA: Insufficient documentation

## 2016-03-31 DIAGNOSIS — Z21 Asymptomatic human immunodeficiency virus [HIV] infection status: Secondary | ICD-10-CM | POA: Insufficient documentation

## 2016-03-31 DIAGNOSIS — F329 Major depressive disorder, single episode, unspecified: Secondary | ICD-10-CM | POA: Insufficient documentation

## 2016-03-31 DIAGNOSIS — F209 Schizophrenia, unspecified: Secondary | ICD-10-CM | POA: Diagnosis not present

## 2016-03-31 DIAGNOSIS — I10 Essential (primary) hypertension: Secondary | ICD-10-CM | POA: Diagnosis not present

## 2016-03-31 DIAGNOSIS — Z760 Encounter for issue of repeat prescription: Secondary | ICD-10-CM

## 2016-03-31 MED ORDER — SIMVASTATIN 20 MG PO TABS: 20.0000 mg | ORAL_TABLET | Freq: Every day | ORAL | Status: DC

## 2016-03-31 NOTE — Discharge Instructions (Signed)
Please use number below to find a primary care provider for further management of your health. Please inquire your HIV status with your new provider to determine appropriate treatment.    Medicine Refill at the Emergency Department We have refilled your medicine today, but it is best for you to get refills through your primary health care provider's office. In the future, please plan ahead so you do not need to get refills from the emergency department. If the medicine we refilled was a maintenance medicine, you may have received only enough to get you by until you are able to see your regular health care provider.   This information is not intended to replace advice given to you by your health care provider. Make sure you discuss any questions you have with your health care provider.   Document Released: 04/04/2004 Document Revised: 01/07/2015 Document Reviewed: 03/26/2014 Elsevier Interactive Patient Education [employer].

## 2016-03-31 NOTE — ED Provider Notes (Signed)
CSN: [id number]     Arrival date & time 03/31/16  1423 History   First MD Initiated Contact with Patient 03/31/16 1449     No chief complaint on file.    (Consider location/radiation/quality/duration/timing/severity/associated sxs/prior Treatment) HPI   60 year old female with history of HIV, schizophrenia, hypertension, and depression presenting today requesting for refill of her HIV medication. Patient states her care provider Dr. Kennon Holter has passed away several months ago. Her last visit with him was 5 months ago. Since she has been without a PCP she is here today requesting for medication refill of her "HIV medication" however she gave me a bottle of simvastatin 20 mg once daily that she has ran out 3 days ago. Upon further inquiry patient states she has not been taking any HIV medication for more than 5 years aside from her "simvastatin HIV medication" She also takes lexapro and seroquel and have not ran out of it yet. She does not have any infectious specialists. She is currently actively looking for another provider. At this time she denies having any active symptoms including no fever headache chest pain service of breath abdominal pain focal numbness weakness body aches or rash.  Past Medical History  Diagnosis Date  . HIV (human immunodeficiency virus infection) ([place])     positive-non active  . Hypertension   . Depression   . Schizophrenia [facility])    Past Surgical History  Procedure Laterality Date  . Abdominal hysterectomy    . Thyroid surgery      needle tx  . Ankle surgery  2004    orif left  . Finger surgery  2004    i/d lt middle finger  . Strabismus surgery Left 12/11/2007  . Strabismus surgery Bilateral 01/22/2014    Procedure: REPAIR STRABISMUS BILATERAL EYES;  Surgeon: Derry Skill, MD;  Location: [place];  Service: Ophthalmology;  Laterality: Bilateral;   Family History  Problem Relation Age of Onset  . Colon cancer Neg Hx   . Heart disease  Mother    Social History  Substance Use Topics  . Smoking status: Never Smoker   . Smokeless tobacco: Former Systems developer  . Alcohol Use: No   OB History    No data available     Review of Systems  All other systems reviewed and are negative.     Allergies  Review of patient's allergies indicates no known allergies.  Home Medications   Prior to Admission medications   Medication Sig Start Date End Date Taking? Authorizing Provider  escitalopram (LEXAPRO) 20 MG tablet Take 1 tablet (20 mg total) by mouth daily. 08/01/15   Margarita Mail, PA-C  loratadine (CLARITIN) 10 MG tablet Take 10 mg by mouth daily.    Historical Provider, MD  QUEtiapine (SEROQUEL) 200 MG tablet Take 1 tablet (200 mg total) by mouth at bedtime. 08/01/15   Margarita Mail, PA-C  simvastatin (ZOCOR) 10 MG tablet Take 10 mg by mouth daily.    Historical Provider, MD   BP 156/71 mmHg  Pulse 93  Temp(Src) 97.9 F (36.6 C) (Oral)  Resp 20  Ht 5\' 6"  (1.676 m)  Wt 122.471 kg  BMI 43.60 kg/m2  SpO2 97% Physical Exam  Constitutional: She is oriented to person, place, and time. She appears well-developed and well-nourished. No distress.  Well appearing African-American female in no acute distress, nontoxic  HENT:  Head: Atraumatic.  Eyes: Conjunctivae are normal.  Neck: Neck supple.  Cardiovascular: Normal rate and regular rhythm.  Pulmonary/Chest: Effort normal and breath sounds normal.  Abdominal: Soft. There is no tenderness.  Musculoskeletal: She exhibits no edema.  Neurological: She is alert and oriented to person, place, and time.  Skin: No rash noted.  Psychiatric: She has a normal mood and affect.  Nursing note and vitals reviewed.   ED Course  Procedures (including critical care time)   MDM   Final diagnoses:  Encounter for medication refill    BP 156/71 mmHg  Pulse 93  Temp(Src) 97.9 F (36.6 C) (Oral)  Resp 20  Ht 5\' 6"  (1.676 m)  Wt 122.471 kg  BMI 43.60 kg/m2  SpO2 97%   3:12  PM Patient's past medical history including HIV however I do not see see any HIV medication on her list of medication despite reviewing prior charts. An HIV antibody was obtained 6 years ago and it was nonreactive. Patient is here with an empty bottle of simvastatin 20 mg by mouth once daily medication in which she requests for refill thinking that this is her HIV medication. I explained to patient the medication is for her cholesterol. I will refill her cholesterol medication and give her outpatient referral to find a primary care provider. This should be further investigation into her HIV status and if she is positive she will need appropriate treatment for that. Patient voiced understanding and agrees with plan.  Domenic Moras, PA-C 03/31/16 [place], MD 04/01/16 [phone]

## 2016-03-31 NOTE — ED Notes (Signed)
Declined W/C at D/C and was escorted to lobby by RN. 

## 2016-06-13 ENCOUNTER — Encounter (HOSPITAL_COMMUNITY): Payer: Self-pay | Admitting: Nurse Practitioner

## 2016-06-13 ENCOUNTER — Emergency Department (HOSPITAL_COMMUNITY)
Admission: EM | Admit: 2016-06-13 | Discharge: 2016-06-13 | Disposition: A | Discharge status: Home / Self Care | Payer: Medicaid Other | Attending: Emergency Medicine | Admitting: Emergency Medicine

## 2016-06-13 DIAGNOSIS — I10 Essential (primary) hypertension: Secondary | ICD-10-CM | POA: Insufficient documentation

## 2016-06-13 DIAGNOSIS — Z21 Asymptomatic human immunodeficiency virus [HIV] infection status: Secondary | ICD-10-CM | POA: Insufficient documentation

## 2016-06-13 DIAGNOSIS — Z760 Encounter for issue of repeat prescription: Secondary | ICD-10-CM | POA: Diagnosis present

## 2016-06-13 MED ORDER — SIMVASTATIN 20 MG PO TABS: 20.0000 mg | ORAL_TABLET | Freq: Every day | ORAL | Status: DC

## 2016-06-13 NOTE — ED Notes (Signed)
Pt has multiple complaints. She c/o several day history of back pain which makes her legs "give out" at times. She also c/o noticing boils on her buttock with white drainage that have been "coming and going for a while."  She states she ran out of her simvastatin and would like a refill, her PCP recently died and she doesn't have a new PCP yet.

## 2016-06-13 NOTE — Discharge Instructions (Signed)
Medicine Refill at the Emergency Department We have refilled your medicine today, but it is best for you to get refills through your primary health care provider's office. In the future, please plan ahead so you do not need to get refills from the emergency department. If the medicine we refilled was a maintenance medicine, you may have received only enough to get you by until you are able to see your regular health care provider.   This information is not intended to replace advice given to you by your health care provider. Make sure you discuss any questions you have with your health care provider.   Document Released: 04/04/2004 Document Revised: 01/07/2015 Document Reviewed: 03/26/2014 Elsevier Interactive Patient Education 2016 [place] The [employer] 211 is a great source of information about community services available.  Access by dialing 2-1-1 from anywhere in [place], or by website -  CustodianSupply.fi.   Other Local Resources (Updated 01/2016)  [employer]    Phone Number and Address  [place] medical care - 1st and 3rd Saturday of every month  Must not qualify for public or private insurance and must have limited income [phone] [address], [place]  Child care  Emergency assistance for housing and [employer]  Medicaid [phone] [address], [place] [address]   [facility] Department  Low-cost medical care for children, communicable diseases, sexually-transmitted diseases, immunizations, maternity care, womens health and family planning [phone] [address], [place] [address]  [facility]   Medication assistance for [facility] residents  Must meet income requirements  [phone] [place], [place].    [place]  Child care  Emergency assistance for housing and [employer]  Medicaid [phone] [address] [place], [place] [address]  [facility] and [place]   Low-cost medical care,   Monday through Friday, 9 am to 6 pm.   Accepts Medicare/Medicaid, and self-pay [phone] [address] [place], [place] [address]  [facility] for [place] care - Monday through Friday, 8:30 am - 5:30 pm  Accepts Medicaid and self-pay [phone] [address] [place], [place] [address]   [facility]  Primary medical care, including for those with sickle cell disease  Accepts Medicare, Medicaid, insurance and self-pay [phone], [place]  [facility]   Primary medical care  Accepts Medicare, [place], insurance and self-pay [phone] [address] [place], [place] [address]   [facility] Department of Social Services  Child care  Emergency assistance for housing and [employer]  Medicaid [phone] [address] [place], [place] [address]  [place] Department of Health and [employer]  Child care  Emergency assistance for housing and [employer]  Medicaid [phone] [place], [place] [address]   [facility] Medication Assistance Program  Medication assistance for [facility] residents with no insurance only  Must have a primary care doctor [phone] [address], [place], [place]  [facility]   Primary medical care  [place], [place], insurance  [phone] [address] [place], [place]  [facility]   Medication assistance [facility]  Primary medical care  Accepts Medicare, Medicaid,  insurance and self-pay [phone] [address], [place] [address]   Internal Medicine   Primary medical care  Accepts Medicare, [place], insurance and self-pay [phone] [address] [place] [place], [place] [address]  [facility]  For [place] residents between the ages of 74 and 37 who do not have any form of health insurance, Medicare, [place], or [place] benefits.  Services are provided free of charge to uninsured patients who fall within federal poverty guidelines.    Hours: Tuesdays and Thursdays, 4:15 - 8 pm [phone] [address] [place], [place] [address]  [facility]     Primary medical care  Dental care  Nutritional counseling  Pharmacy  Accepts Medicaid, Medicare, most insurance.  Fees are adjusted based on ability to pay.   [place], [place] [address], [place], [place] [facility], [place], [place] [facility] [place], [place]  [facility]  Womens health and family planning [phone]. [place], [place] care  Emergency assistance for housing and [employer]  Medicaid [phone] [address] [place], [place] [address]   [facility]    Ages 71 and older  Hours: Mondays and Thursdays, 7:00 am - 9:00 am Patients are seen on a first come, first served basis. [phone], ext. [place], [place]  Child care  Emergency assistance for housing and [employer]  Medicaid [phone] [address], [place]  [address]  [facility] [place]  Medication assistance  Rental assistance  Food pantry  Medication assistance  Housing assistance  Emergency food distribution  Utility assistance [place], [place]  [address]. [place], [place] [address] Hours: Tuesdays and Thursdays from 9am - 12 noon by appointment only  [place],  [address]  [facility] private insurance, [place], and [place].  Payment is based on a sliding scale for those without insurance.  Hours: Mondays, Tuesdays and Thursdays, 8:30 am - 5:30 pm.   [phone] [place], [place]  [facility] - Family Medicine at [facility], [place], and [place].  Payment is based on a sliding scale for those without insurance. [phone] [address] [place], [place]  [facility] - Pediatrics at [address] Scient[address], research (medical), [employer], and [place].  Payment is based on a sliding scale for those without insurance [phone] [address] [place], [employer], [place]  [facility] - Pediatrics at [employer], [place], and [place].  Payment is based on a sliding scale for those without insurance. [phone] [place], [place]  [facility] - Pediatrics at [facility], [place], and [place].  Payment is based on a sliding scale for those without insurance. [phone], ext. [phone] [place], [place].    [place] care.  Accepts Medicaid and self-pay. [phone] [place]  [place], [place]    Use the list of resources above to acquire a new primary care provider. Return to the Emergency Department if you experience  severe muscles pains, chest pain, shortness of breath, fevers or chills.

## 2016-06-13 NOTE — ED Notes (Signed)
Pt states she is currently in no pain and is requesting a refill on her simvastatin and a list of medicaid providers.

## 2016-06-13 NOTE — ED Provider Notes (Signed)
CSN: [id number]     Arrival date & time 06/13/16  1450 History  By signing my name below, I, Soijett Blue, attest that this documentation has been prepared under the direction and in the presence of Donnald Garre, PA-C Electronically Signed: Soijett Blue, ED Scribe. 06/13/2016. 4:49 PM.   Chief Complaint  Patient presents with  . Back Pain      The history is provided by the patient. No language interpreter was used.    HPI Comments: Diane Sutton is a 60 y.o. female with a medical hx of HIV, HTN, depression, schizophrenia, who presents to the Emergency Department complaining of medication refill onset today. She notes that she has ran out of her simvastatin that was Rx to her by the late Dr. [place] Qua. Pt reports that she has called local doctors offices to be seen and no one is seeing pt at this time. Pt last dose of simvastatin was 2 months ago. She states that she has not tried any medications for the relief for her symptoms. She denies any other symptoms.   Pt secondarily complains of intermittent chronic blisters to her buttocks. Pt denies having any blisters at this time. Denies any other symptoms at this time     Past Medical History  Diagnosis Date  . HIV (human immunodeficiency virus infection) ([place])     positive-non active  . Hypertension   . Depression   . Schizophrenia [facility])    Past Surgical History  Procedure Laterality Date  . Abdominal hysterectomy    . Thyroid surgery      needle tx  . Ankle surgery  2004    orif left  . Finger surgery  2004    i/d lt middle finger  . Strabismus surgery Left 12/11/2007  . Strabismus surgery Bilateral 01/22/2014    Procedure: REPAIR STRABISMUS BILATERAL EYES;  Surgeon: Derry Skill, MD;  Location: [place];  Service: Ophthalmology;  Laterality: Bilateral;   Family History  Problem Relation Age of Onset  . Colon cancer Neg Hx   . Heart disease Mother    Social History  Substance Use Topics  .  Smoking status: Never Smoker   . Smokeless tobacco: Former Systems developer  . Alcohol Use: No   OB History    No data available     Review of Systems  All other systems reviewed and are negative.     Allergies  Review of patient's allergies indicates no known allergies.  Home Medications   Prior to Admission medications   Medication Sig Start Date End Date Taking? Authorizing Provider  escitalopram (LEXAPRO) 20 MG tablet Take 1 tablet (20 mg total) by mouth daily. 08/01/15   Margarita Mail, PA-C  loratadine (CLARITIN) 10 MG tablet Take 10 mg by mouth daily.    Historical Provider, MD  QUEtiapine (SEROQUEL) 200 MG tablet Take 1 tablet (200 mg total) by mouth at bedtime. 08/01/15   Margarita Mail, PA-C  simvastatin (ZOCOR) 20 MG tablet Take 1 tablet (20 mg total) by mouth daily. 03/31/16   Domenic Moras, PA-C   BP 152/110 mmHg  Pulse 90  Temp(Src) 98.2 F (36.8 C) (Oral)  Resp 18  Wt 285 lb (129.275 kg)  SpO2 98% Physical Exam  Constitutional: She is oriented to person, place, and time. She appears well-developed and well-nourished. No distress.  HENT:  Head: Normocephalic and atraumatic.  Eyes: Conjunctivae are normal. Right eye exhibits no discharge. Left eye exhibits no discharge. No scleral icterus.  Cardiovascular: Normal  rate, regular rhythm and normal heart sounds.  Exam reveals no gallop and no friction rub.   No murmur heard. Pulmonary/Chest: Effort normal and breath sounds normal. No respiratory distress. She has no wheezes. She has no rales.  Neurological: She is alert and oriented to person, place, and time. Coordination normal.  Skin: Skin is warm and dry. No rash noted. She is not diaphoretic. No erythema. No pallor.  Psychiatric: She has a normal mood and affect. Her behavior is normal.  Nursing note and vitals reviewed.   ED Course  Procedures (including critical care time) DIAGNOSTIC STUDIES: Oxygen Saturation is 98% on RA, nl by my interpretation.    COORDINATION OF  CARE: 4:48 PM Discussed treatment plan with pt at bedside which includes medication refill and pt agreed to plan.    Labs Review Labs Reviewed - No data to display  Imaging Review No results found.    EKG Interpretation None      MDM   Final diagnoses:  Medication refill    Pt here for refill of simvastatin. Medication is not a controlled substance. Will refill medication here. Resources for local primary care providers given per pt request. Discussed need to follow up with PCP in 2-3 days.  Pt is safe for discharge at this time.   I personally performed the services described in this documentation, which was scribed in my presence. The recorded information has been reviewed and is accurate.     Dondra Spry [place], PA-C 06/13/16 2117  Davonna Belling, MD 06/14/16 [phone]

## 2016-06-13 NOTE — ED Notes (Signed)
Pt is in stable condition upon d/c and ambulates from ED. 

## 2017-02-05 ENCOUNTER — Encounter (HOSPITAL_COMMUNITY): Payer: Self-pay | Admitting: Emergency Medicine

## 2017-02-05 ENCOUNTER — Emergency Department (HOSPITAL_COMMUNITY): Payer: Medicaid Other

## 2017-02-05 ENCOUNTER — Emergency Department (HOSPITAL_COMMUNITY)
Admission: EM | Admit: 2017-02-05 | Discharge: 2017-02-05 | Disposition: A | Discharge status: Home / Self Care | Payer: Medicaid Other | Attending: Emergency Medicine | Admitting: Emergency Medicine

## 2017-02-05 DIAGNOSIS — S6992XA Unspecified injury of left wrist, hand and finger(s), initial encounter: Secondary | ICD-10-CM | POA: Diagnosis present

## 2017-02-05 DIAGNOSIS — S80212A Abrasion, left knee, initial encounter: Secondary | ICD-10-CM | POA: Diagnosis not present

## 2017-02-05 DIAGNOSIS — S62617A Displaced fracture of proximal phalanx of left little finger, initial encounter for closed fracture: Secondary | ICD-10-CM

## 2017-02-05 DIAGNOSIS — Y9389 Activity, other specified: Secondary | ICD-10-CM | POA: Diagnosis not present

## 2017-02-05 DIAGNOSIS — W19XXXA Unspecified fall, initial encounter: Secondary | ICD-10-CM

## 2017-02-05 DIAGNOSIS — T148XXA Other injury of unspecified body region, initial encounter: Secondary | ICD-10-CM

## 2017-02-05 DIAGNOSIS — I10 Essential (primary) hypertension: Secondary | ICD-10-CM | POA: Insufficient documentation

## 2017-02-05 DIAGNOSIS — Y92480 Sidewalk as the place of occurrence of the external cause: Secondary | ICD-10-CM | POA: Insufficient documentation

## 2017-02-05 DIAGNOSIS — Y999 Unspecified external cause status: Secondary | ICD-10-CM | POA: Insufficient documentation

## 2017-02-05 DIAGNOSIS — W010XXA Fall on same level from slipping, tripping and stumbling without subsequent striking against object, initial encounter: Secondary | ICD-10-CM | POA: Insufficient documentation

## 2017-02-05 MED ORDER — TETANUS-DIPHTH-ACELL PERTUSSIS 5-2.5-18.5 LF-MCG/0.5 IM SUSP
0.5000 mL | Freq: Once | INTRAMUSCULAR | Status: AC
  Administered 2017-02-05: 0.5 mL via INTRAMUSCULAR
  Filled 2017-02-05: qty 0.5

## 2017-02-05 NOTE — ED Triage Notes (Signed)
Pt here for trip and fall; pt has small abrasion to left pinky finger and pain in left knee; pt sts mis stepped

## 2017-02-05 NOTE — ED Notes (Signed)
Pt stat she understands instructions. Home stable with steady gait. 

## 2017-02-05 NOTE — ED Notes (Signed)
X-ray called stating pt is also complaining of left hand pain but no x-ray was ordered of the hand.  X-ray ordered at this time.

## 2017-02-05 NOTE — ED Notes (Signed)
Abrasion at left knee cleaned and dressed

## 2017-02-05 NOTE — Discharge Instructions (Signed)
Contact a health care provider if: You have pain or swelling that limits the motion or use of your fingers. Get help right away if: Your finger becomes numb.
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
## 2017-02-13 ENCOUNTER — Emergency Department (HOSPITAL_COMMUNITY)
Admission: EM | Admit: 2017-02-13 | Discharge: 2017-02-13 | Disposition: A | Discharge status: Home / Self Care | Payer: Medicaid Other | Attending: Emergency Medicine | Admitting: Emergency Medicine

## 2017-02-13 ENCOUNTER — Encounter (HOSPITAL_COMMUNITY): Payer: Self-pay | Admitting: *Deleted

## 2017-02-13 DIAGNOSIS — Z4789 Encounter for other orthopedic aftercare: Secondary | ICD-10-CM | POA: Insufficient documentation

## 2017-02-13 DIAGNOSIS — M79645 Pain in left finger(s): Secondary | ICD-10-CM | POA: Insufficient documentation

## 2017-02-13 DIAGNOSIS — Z09 Encounter for follow-up examination after completed treatment for conditions other than malignant neoplasm: Secondary | ICD-10-CM

## 2017-02-13 DIAGNOSIS — Z79899 Other long term (current) drug therapy: Secondary | ICD-10-CM | POA: Diagnosis not present

## 2017-02-13 DIAGNOSIS — I10 Essential (primary) hypertension: Secondary | ICD-10-CM | POA: Insufficient documentation

## 2017-02-13 NOTE — ED Provider Notes (Signed)
[facility] DEPT Provider Note   CSN: [id number] Arrival date & time: 02/13/17  1224  By signing my name below, I, Neta Mends, attest that this documentation has been prepared under the direction and in the presence of Montine Circle, PA-C. Electronically Signed: Neta Mends, ED Scribe. 02/13/2017. 2:08 PM.    History   Chief Complaint Chief Complaint  Patient presents with  . Follow-up  . Finger Injury    The history is provided by the patient. No language interpreter was used.   HPI Comments:  Diane Sutton is a 61 y.o. female who presents to the Emergency Department, here to follow up for a finger injury that she sustained on 02/05/17. Pt was seen here after fracturing her left 5th finger and treated with a splint. Pt has a follow up appointment with orthopaedics on 03/04/2017. Pt has no complaints other than mild pain which has improved since the injury. Pt denies other associated symptoms.   Past Medical History:  Diagnosis Date  . Depression   . HIV (human immunodeficiency virus infection) ([place])    positive-non active  . Hypertension   . Schizophrenia [facility])     Patient Active Problem List   Diagnosis Date Noted  . HIV (human immunodeficiency virus infection) ([place])   . 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 SKIN RASH 10/04/2010  . ALLERGIC RHINITIS 05/02/2010  . CANDIDIASIS 04/05/2010  . THYROID NODULE, RIGHT 10/07/2009  . PNEUMONIA 09/23/2009  . CERVICAL RADICULOPATHY, LEFT 09/12/2009  . UTI 08/10/2009  . CHEST PAIN UNSPECIFIED 08/10/2009  . 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 INGROWING NAIL 10/05/2008  . PRURITUS, EARS 06/08/2008  . OTALGIA 06/02/2008  . VAGINITIS, BACTERIAL 03/31/2008  . ARTHRITIS, RIGHT KNEE 03/23/2008  . OTHER ABNORMAL BLOOD CHEMISTRY 01/29/2008  . GOITER, MULTINODULAR 08/29/2007  . HYPERTHYROIDISM 08/29/2007  . DEPRESSION 08/29/2007  . MIGRAINE, CHRONIC 08/29/2007  . STRABISMUS 08/29/2007  . AUDITORY  HALLUCINATION 08/29/2007    Past Surgical History:  Procedure Laterality Date  . ABDOMINAL HYSTERECTOMY    . ANKLE SURGERY  2004   orif left  . FINGER SURGERY  2004   i/d lt middle finger  . STRABISMUS SURGERY Left 12/11/2007  . STRABISMUS SURGERY Bilateral 01/22/2014   Procedure: REPAIR STRABISMUS BILATERAL EYES;  Surgeon: Derry Skill, MD;  Location: [facility];  Service: Ophthalmology;  Laterality: Bilateral;  . THYROID SURGERY     needle tx    OB History    No data available       Home Medications    Prior to Admission medications   Medication Sig Start Date End Date Taking? Authorizing Provider  escitalopram (LEXAPRO) 20 MG tablet Take 1 tablet (20 mg total) by mouth daily. 08/01/15   Margarita Mail, PA-C  loratadine (CLARITIN) 10 MG tablet Take 10 mg by mouth daily.    Historical Provider, MD  QUEtiapine (SEROQUEL) 200 MG tablet Take 1 tablet (200 mg total) by mouth at bedtime. 08/01/15   Margarita Mail, PA-C  simvastatin (ZOCOR) 20 MG tablet Take 1 tablet (20 mg total) by mouth daily. 06/13/16   Samantha Tripp Dowless, PA-C    Family History Family History  Problem Relation Age of Onset  . Heart disease Mother   . Colon cancer Neg Hx     Social History Social History  Substance Use Topics  . Smoking status: Never Smoker  . Smokeless tobacco: Former Systems developer  . Alcohol use No     Allergies   Patient has no known allergies.  Review of Systems Review of Systems  Musculoskeletal: Positive for arthralgias.  All other systems reviewed and are negative.    Physical Exam Updated Vital Signs BP 139/91 (BP Location: Right Arm)   Pulse 99   Temp 97.9 F (36.6 C) (Oral)   Resp 18   SpO2 97%   Physical Exam Nursing note and vitals reviewed.  Constitutional: Pt appears well-developed and well-nourished. No distress.  HENT:  Head: Normocephalic and atraumatic.  Eyes: Conjunctivae are normal.  Neck: Normal range of motion.  Cardiovascular:  Normal rate, regular rhythm. Intact distal pulses.   Capillary refill < 3 sec.  Pulmonary/Chest: Effort normal and breath sounds normal.  Musculoskeletal:  Left little finger Pt exhibits mild tenderness at the proximal phalanx, no clear bony deformity, minimal swelling, no erythema or evidence of infection.   ROM: 3/5 limited by pain  Strength: 3/5 limited by pain  Neurological: Pt  is alert. Coordination normal.  Sensation: 5/5 Skin: Skin is warm and dry. Pt is not diaphoretic.  No evidence of open wound or skin tenting Psychiatric: Pt has a normal mood and affect.     ED Treatments / Results  DIAGNOSTIC STUDIES:  Oxygen Saturation is 97% on RA, normal by my interpretation.    COORDINATION OF CARE:  2:08 PM Discussed treatment plan with pt at bedside and pt agreed to plan.   Labs (all labs ordered are listed, but only abnormal results are displayed) Labs Reviewed - No data to display  EKG  EKG Interpretation None       Radiology No results found.  Procedures Procedures (including critical care time)  Medications Ordered in ED Medications - No data to display   Initial Impression / Assessment and Plan / ED Course  I have reviewed the triage vital signs and the nursing notes.  Pertinent labs & imaging results that were available during my care of the patient were reviewed by me and considered in my medical decision making (see chart for details).     Patient X-Ray negative from 2/6 shows fracture of proximal phalanx of left little finger.  Pt advised to follow up as previously planned. Patient given splint while in ED, conservative therapy recommended and discussed. Patient will be discharged home & is agreeable with above plan. Returns precautions discussed. Pt appears safe for discharge.   Final Clinical Impressions(s) / ED Diagnoses   Final diagnoses:  Follow up    New Prescriptions New Prescriptions   No medications on file  I personally performed  the services described in this documentation, which was scribed in my presence. The recorded information has been reviewed and is accurate.       Montine Circle, PA-C 02/13/17 [place] Liu, MD 02/14/17 [phone]

## 2017-02-13 NOTE — ED Triage Notes (Signed)
Pt had fall on 2/6 and seen for left little finger injury/fracture. Was told to follow up but unable to get appt till march. No complaints other than mild pain. Has splint on pta, no redness or swelling noted.

## 2017-02-13 NOTE — Discharge Instructions (Signed)
Continue wearing the finger splint for the next 2 weeks.    You may take it off for showering.   You are activities are limited to what you can do with the finger splint.

## 2017-05-07 ENCOUNTER — Other Ambulatory Visit: Payer: Self-pay | Admitting: Internal Medicine

## 2017-05-07 DIAGNOSIS — Z1231 Encounter for screening mammogram for malignant neoplasm of breast: Secondary | ICD-10-CM

## 2017-05-07 DIAGNOSIS — R922 Inconclusive mammogram: Secondary | ICD-10-CM

## 2017-05-13 ENCOUNTER — Other Ambulatory Visit: Payer: Medicaid Other

## 2017-05-14 ENCOUNTER — Ambulatory Visit
Admission: RE | Admit: 2017-05-14 | Discharge: 2017-05-14 | Disposition: A | Discharge status: Home / Self Care | Payer: Medicaid Other | Source: Ambulatory Visit | Attending: Internal Medicine | Admitting: Internal Medicine

## 2017-05-14 DIAGNOSIS — Z1231 Encounter for screening mammogram for malignant neoplasm of breast: Secondary | ICD-10-CM

## 2017-05-31 ENCOUNTER — Emergency Department (HOSPITAL_COMMUNITY)
Admission: EM | Admit: 2017-05-31 | Discharge: 2017-05-31 | Disposition: A | Discharge status: Home / Self Care | Payer: Medicaid Other | Attending: Emergency Medicine | Admitting: Emergency Medicine

## 2017-05-31 ENCOUNTER — Encounter (HOSPITAL_COMMUNITY): Payer: Self-pay | Admitting: Nurse Practitioner

## 2017-05-31 DIAGNOSIS — Z79899 Other long term (current) drug therapy: Secondary | ICD-10-CM | POA: Insufficient documentation

## 2017-05-31 DIAGNOSIS — Z760 Encounter for issue of repeat prescription: Secondary | ICD-10-CM

## 2017-05-31 DIAGNOSIS — I10 Essential (primary) hypertension: Secondary | ICD-10-CM | POA: Diagnosis not present

## 2017-05-31 MED ORDER — QUETIAPINE FUMARATE 200 MG PO TABS: 200.0000 mg | ORAL_TABLET | Freq: Every day | ORAL | 0 refills | Status: DC

## 2017-05-31 MED ORDER — ESCITALOPRAM OXALATE 20 MG PO TABS: 20.0000 mg | ORAL_TABLET | Freq: Every day | ORAL | 0 refills | Status: DC

## 2017-05-31 NOTE — Discharge Instructions (Signed)
Please get all future refills from your personal doctor.

## 2017-05-31 NOTE — ED Provider Notes (Signed)
[facility] DEPT Provider Note   CSN: [id number] Arrival date & time: 05/31/17  1349  By signing my name below, I, Dora Sims, attest that this documentation has been prepared under the direction and in the presence of Wyn Quaker, [place]. Electronically Signed: Dora Sims, Scribe. 05/31/2017. 2:44 PM.  History   Chief Complaint Chief Complaint  Patient presents with  . Medication Refill   The history is provided by the patient. No language interpreter was used.    HPI Comments: Diane Sutton is a 61 y.o. female with PMHx including anxiety and depression who presents to the Emergency Department requesting refills of her Seroquel and Lexapro. She states these medications are prescribed by her mental health counselor and FNP, Grover Canavan. Patient states Ms. Melina Copa recently forgot to call in the aforementioned medications to the pharmacy and advised patient to come to the ED for her refills. Patient is unable to clarify why NP Melina Copa did not call her meds in.  She notes she has been taking Seroquel and Lexapro for "a while" and endorses difficulty sleeping without her Seroquel. Patient has no additional complaints or concerns at this time. Her PCP is Dr. Kevan Ny.  Past Medical History:  Diagnosis Date  . Depression   . HIV (human immunodeficiency virus infection) ([place])    positive-non active  . Hypertension   . Schizophrenia [facility])     Patient Active Problem List   Diagnosis Date Noted  . HIV (human immunodeficiency virus infection) ([place])   . 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 SKIN RASH 10/04/2010  . ALLERGIC RHINITIS 05/02/2010  . CANDIDIASIS 04/05/2010  . THYROID NODULE, RIGHT 10/07/2009  . PNEUMONIA 09/23/2009  . CERVICAL RADICULOPATHY, LEFT 09/12/2009  . UTI 08/10/2009  . CHEST PAIN UNSPECIFIED 08/10/2009  . 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 INGROWING NAIL 10/05/2008  . PRURITUS, EARS 06/08/2008  . OTALGIA 06/02/2008  . VAGINITIS, BACTERIAL  03/31/2008  . ARTHRITIS, RIGHT KNEE 03/23/2008  . OTHER ABNORMAL BLOOD CHEMISTRY 01/29/2008  . GOITER, MULTINODULAR 08/29/2007  . HYPERTHYROIDISM 08/29/2007  . DEPRESSION 08/29/2007  . MIGRAINE, CHRONIC 08/29/2007  . STRABISMUS 08/29/2007  . AUDITORY HALLUCINATION 08/29/2007    Past Surgical History:  Procedure Laterality Date  . ABDOMINAL HYSTERECTOMY    . ANKLE SURGERY  2004   orif left  . FINGER SURGERY  2004   i/d lt middle finger  . STRABISMUS SURGERY Left 12/11/2007  . STRABISMUS SURGERY Bilateral 01/22/2014   Procedure: REPAIR STRABISMUS BILATERAL EYES;  Surgeon: Derry Skill, MD;  Location: [facility];  Service: Ophthalmology;  Laterality: Bilateral;  . THYROID SURGERY     needle tx    OB History    No data available       Home Medications    Prior to Admission medications   Medication Sig Start Date End Date Taking? Authorizing Provider  escitalopram (LEXAPRO) 20 MG tablet Take 1 tablet (20 mg total) by mouth daily. 05/31/17 06/14/17  Fatima Blank, MD  loratadine (CLARITIN) 10 MG tablet Take 10 mg by mouth daily.    [provider]  QUEtiapine (SEROQUEL) 200 MG tablet Take 1 tablet (200 mg total) by mouth at bedtime. 05/31/17 06/14/17  Fatima Blank, MD  simvastatin (ZOCOR) 20 MG tablet Take 1 tablet (20 mg total) by mouth daily. 06/13/16   Dowless, Dondra Spry, PA-C    Family History Family History  Problem Relation Age of Onset  . Heart disease Mother   . Colon cancer Neg Hx  Social History Social History  Substance Use Topics  . Smoking status: Never Smoker  . Smokeless tobacco: Former Systems developer  . Alcohol use No     Allergies   Patient has no known allergies.   Review of Systems Review of Systems  Constitutional: Negative for chills and fever.  Psychiatric/Behavioral: Negative for decreased concentration and self-injury. The patient is not nervous/anxious.    Physical Exam Updated Vital Signs BP  118/71   Pulse 94   Temp 97.9 F (36.6 C) (Oral)   Resp 17   SpO2 97%   Physical Exam  Constitutional: She appears well-developed and well-nourished. No distress.  HENT:  Head: Normocephalic and atraumatic.  Cardiovascular: Normal rate.   Pulmonary/Chest: Effort normal. No respiratory distress.  Musculoskeletal: Normal range of motion.  Neurological: She is alert.  Psychiatric: Her speech is normal and behavior is normal. Her affect is blunt.  Nursing note and vitals reviewed.  ED Treatments / Results  Labs (all labs ordered are listed, but only abnormal results are displayed) Labs Reviewed - No data to display  EKG  EKG Interpretation None       Radiology No results found.  Procedures Procedures (including critical care time)  DIAGNOSTIC STUDIES: Oxygen Saturation is 97% on RA, normal by my interpretation.    COORDINATION OF CARE: 2:43 PM Discussed treatment plan with pt at bedside and pt agreed to plan.  Medications Ordered in ED Medications - No data to display   Initial Impression / Assessment and Plan / ED Course  I have reviewed the triage vital signs and the nursing notes.  Pertinent labs & imaging results that were available during my care of the patient were reviewed by me and considered in my medical decision making (see chart for details).    Diane Sutton presents for a med refill.  She denies any symptoms and has no other concerns,  She has an appointment with her regular medication prescriber next week.  She was informed it is not appropriate to obtain medical refills in the ED as chart review shows she has been here for similar before.  She will be given a re-fill at this time of her requested medications.     At this time there does not appear to be any evidence of an acute emergency medical condition and the patient appears stable for discharge with appropriate outpatient follow up.Diagnosis was discussed with patient who verbalizes  understanding and is agreeable to discharge. Pt case discussed with Dr. Leonette Monarch who agrees with my plan.   Final Clinical Impressions(s) / ED Diagnoses   Final diagnoses:  Medication refill    New Prescriptions Discharge Medication List as of 05/31/2017  3:14 PM     I personally performed the services described in this documentation, which was scribed in my presence. The recorded information has been reviewed and is accurate.    Lorin Glass, PA-C 06/01/17 1900    Fatima Blank, MD 06/04/17 [phone]

## 2017-05-31 NOTE — ED Triage Notes (Addendum)
Pt presents with c/o medication refill. She needs refills on her Seroquel and lexapro. She took her last dose of both medications this morning. Her appointment at [facility] for management of these medications was moved to next week and she was instructed by [facility] to come to emergency department requesting refill. She says that she feels well today and denies any complaints

## 2018-05-04 ENCOUNTER — Emergency Department (HOSPITAL_COMMUNITY)
Admission: EM | Admit: 2018-05-04 | Discharge: 2018-05-04 | Disposition: A | Discharge status: Home / Self Care | Payer: Medicaid Other | Attending: Emergency Medicine | Admitting: Emergency Medicine

## 2018-05-04 ENCOUNTER — Encounter (HOSPITAL_COMMUNITY): Payer: Self-pay | Admitting: Emergency Medicine

## 2018-05-04 DIAGNOSIS — L304 Erythema intertrigo: Secondary | ICD-10-CM | POA: Diagnosis not present

## 2018-05-04 DIAGNOSIS — Z79899 Other long term (current) drug therapy: Secondary | ICD-10-CM | POA: Insufficient documentation

## 2018-05-04 DIAGNOSIS — R1030 Lower abdominal pain, unspecified: Secondary | ICD-10-CM | POA: Diagnosis present

## 2018-05-04 MED ORDER — NYSTATIN 100000 UNIT/GM EX CREA
1.0000 | TOPICAL_CREAM | Freq: Two times a day (BID) | CUTANEOUS | 0 refills | Status: DC
Start: 2018-05-04 — End: 2019-08-10

## 2018-05-04 NOTE — ED Triage Notes (Signed)
Patient presents to ED for assessment of bilateral inner thigh pain where patient states her skin is "rubbing off" and sore.  Denies sexual interaction in over 6 months.  States it rubs and gets raw and hurts her.  States multiple flare ups in the past few weeks.

## 2018-05-04 NOTE — Discharge Instructions (Addendum)
The skin irritation and breakdown is very common in areas where skin touches and remains warm and moist.  This can be an environment for yeast, please use nystatin cream as directed, please follow-up with your primary care doctor for recheck, if this is not improving with nystatin cream there may be another cream that helps improve your symptoms more.  Return to the emergency department for fevers, redness, swelling to the irritated area or any drainage or any other new or concerning symptoms.

## 2018-05-04 NOTE — ED Notes (Signed)
Pt a fast track, see PA assessment.

## 2018-05-04 NOTE — ED Notes (Signed)
RN assisted provider in exam

## 2018-05-04 NOTE — ED Provider Notes (Signed)
[facility] EMERGENCY DEPARTMENT Provider Note   CSN: [id number] Arrival date & time: 05/04/18  1939     History   Chief Complaint Chief Complaint  Patient presents with  . Groin Pain    HPI Diane Sutton is a 62 y.o. female.  Diane Sutton is a 62 y.o. Female with a history of HIV, hypertension, depression and schizophrenia, presents to the ED for evaluation of painful irritation of the skin on her inner thighs and around her underwear line. She reports her things have been rubbing together and causing painful bumps and irritation. Pain is primarily with walking. She denies any bleeding or drainage. No vaginal pain or discharge, no recent sexual activity. No dysuria. No fevers or chills. Pt does reports hx of prior yeast infections.      Past Medical History:  Diagnosis Date  . Depression   . HIV (human immunodeficiency virus infection) ([place])    positive-non active  . Hypertension   . Schizophrenia [facility])     Patient Active Problem List   Diagnosis Date Noted  . HIV (human immunodeficiency virus infection) ([place])   . 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 SKIN RASH 10/04/2010  . ALLERGIC RHINITIS 05/02/2010  . CANDIDIASIS 04/05/2010  . THYROID NODULE, RIGHT 10/07/2009  . PNEUMONIA 09/23/2009  . CERVICAL RADICULOPATHY, LEFT 09/12/2009  . UTI 08/10/2009  . CHEST PAIN UNSPECIFIED 08/10/2009  . 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 INGROWING NAIL 10/05/2008  . PRURITUS, EARS 06/08/2008  . OTALGIA 06/02/2008  . VAGINITIS, BACTERIAL 03/31/2008  . ARTHRITIS, RIGHT KNEE 03/23/2008  . OTHER ABNORMAL BLOOD CHEMISTRY 01/29/2008  . GOITER, MULTINODULAR 08/29/2007  . HYPERTHYROIDISM 08/29/2007  . DEPRESSION 08/29/2007  . MIGRAINE, CHRONIC 08/29/2007  . STRABISMUS 08/29/2007  . AUDITORY HALLUCINATION 08/29/2007    Past Surgical History:  Procedure Laterality Date  . ABDOMINAL HYSTERECTOMY    . ANKLE SURGERY  2004   orif left  . FINGER SURGERY  2004    i/d lt middle finger  . STRABISMUS SURGERY Left 12/11/2007  . STRABISMUS SURGERY Bilateral 01/22/2014   Procedure: REPAIR STRABISMUS BILATERAL EYES;  Surgeon: Derry Skill, MD;  Location: [place] [facility];  Service: Ophthalmology;  Laterality: Bilateral;  . THYROID SURGERY     needle tx     OB History   None      Home Medications    Prior to Admission medications   Medication Sig Start Date End Date Taking? Authorizing Provider  escitalopram (LEXAPRO) 20 MG tablet Take 1 tablet (20 mg total) by mouth daily. 05/31/17 06/14/17  Fatima Blank, MD  loratadine (CLARITIN) 10 MG tablet Take 10 mg by mouth daily.    [provider]  nystatin cream (MYCOSTATIN) Apply 1 application topically 2 (two) times daily. Apply to affected area twice daily for 1 week 05/04/18   Jacqlyn Larsen, PA-C  QUEtiapine (SEROQUEL) 200 MG tablet Take 1 tablet (200 mg total) by mouth at bedtime. 05/31/17 06/14/17  Fatima Blank, MD  simvastatin (ZOCOR) 20 MG tablet Take 1 tablet (20 mg total) by mouth daily. 06/13/16   Dowless, Dondra Spry, PA-C    Family History Family History  Problem Relation Age of Onset  . Heart disease Mother   . Colon cancer Neg Hx     Social History Social History   Tobacco Use  . Smoking status: Never Smoker  . Smokeless tobacco: Former Network engineer Use Topics  . Alcohol use: No  . Drug use: No  Allergies   Patient has no known allergies.   Review of Systems Review of Systems  Constitutional: Negative for chills and fever.  Genitourinary: Negative for dysuria, frequency, vaginal bleeding, vaginal discharge and vaginal pain.  Skin: Positive for rash. Negative for color change, pallor and wound.     Physical Exam Updated Vital Signs BP 122/81 (BP Location: Right Arm)   Pulse 76   Temp 97.8 F (36.6 C) (Oral)   Resp 20   SpO2 95%   Physical Exam  Constitutional: She appears well-developed and well-nourished. No  distress.  HENT:  Head: Normocephalic and atraumatic.  Eyes: Right eye exhibits no discharge. Left eye exhibits no discharge.  Pulmonary/Chest: Effort normal. No respiratory distress.  Genitourinary:  Genitourinary Comments: Chaperone present during exam.  Skin of inner thighs and underwear line appears irritated with several areas of mild skin breakdown, no erythema, pustules, vesicles or petechiae, no evidence of labial or vaginal involvement   Neurological: She is alert. Coordination normal.  Skin: Skin is warm and dry. Rash noted. She is not diaphoretic.  Psychiatric: She has a normal mood and affect. Her behavior is normal.  Nursing note and vitals reviewed.    ED Treatments / Results  Labs (all labs ordered are listed, but only abnormal results are displayed) Labs Reviewed - No data to display  EKG None  Radiology No results found.  Procedures Procedures (including critical care time)  Medications Ordered in ED Medications - No data to display   Initial Impression / Assessment and Plan / ED Course  I have reviewed the triage vital signs and the nursing notes.  Pertinent labs & imaging results that were available during my care of the patient were reviewed by me and considered in my medical decision making (see chart for details).  Pt presents for painful irritation of the skin of the inner thighs and underwear line. Exam most consistent with intertrigo, there is no erythema on exam, no drainage, but mild skin breakdown, given moist environment pt is at risk for yeast. No fevers. No evidence of labial or vaginal involvement. Will treat with nystatin cream. Pt to follow up with PCP. Return precautions discussed, pt expresses understanding and is in agreement with plan.  Final Clinical Impressions(s) / ED Diagnoses   Final diagnoses:  Intertrigo    ED Discharge Orders        Ordered    nystatin cream (MYCOSTATIN)  2 times daily     05/04/18 2247       Jacqlyn Larsen, [place] 05/05/18 [id number]    Charlesetta Shanks, MD 05/05/18 [phone]

## 2018-05-16 ENCOUNTER — Encounter (HOSPITAL_COMMUNITY): Payer: Self-pay

## 2018-05-16 ENCOUNTER — Other Ambulatory Visit: Payer: Self-pay

## 2018-05-16 ENCOUNTER — Emergency Department (HOSPITAL_COMMUNITY)
Admission: EM | Admit: 2018-05-16 | Discharge: 2018-05-17 | Disposition: A | Discharge status: Home / Self Care | Payer: Medicaid Other | Attending: Emergency Medicine | Admitting: Emergency Medicine

## 2018-05-16 ENCOUNTER — Emergency Department (HOSPITAL_COMMUNITY): Payer: Medicaid Other

## 2018-05-16 DIAGNOSIS — F203 Undifferentiated schizophrenia: Secondary | ICD-10-CM | POA: Diagnosis not present

## 2018-05-16 DIAGNOSIS — Z21 Asymptomatic human immunodeficiency virus [HIV] infection status: Secondary | ICD-10-CM | POA: Diagnosis not present

## 2018-05-16 DIAGNOSIS — I10 Essential (primary) hypertension: Secondary | ICD-10-CM | POA: Insufficient documentation

## 2018-05-16 DIAGNOSIS — F329 Major depressive disorder, single episode, unspecified: Secondary | ICD-10-CM | POA: Diagnosis not present

## 2018-05-16 DIAGNOSIS — F200 Paranoid schizophrenia: Secondary | ICD-10-CM | POA: Diagnosis present

## 2018-05-16 DIAGNOSIS — R51 Headache: Secondary | ICD-10-CM | POA: Diagnosis not present

## 2018-05-16 DIAGNOSIS — Z79899 Other long term (current) drug therapy: Secondary | ICD-10-CM | POA: Diagnosis not present

## 2018-05-16 DIAGNOSIS — F251 Schizoaffective disorder, depressive type: Secondary | ICD-10-CM | POA: Insufficient documentation

## 2018-05-16 DIAGNOSIS — R4689 Other symptoms and signs involving appearance and behavior: Secondary | ICD-10-CM | POA: Diagnosis present

## 2018-05-16 LAB — COMPREHENSIVE METABOLIC PANEL
ALBUMIN: 3.8 g/dL (ref 3.5–5.0)
ALK PHOS: 73 U/L (ref 38–126)
ALT: 19 U/L (ref 14–54)
AST: 24 U/L (ref 15–41)
Anion gap: 12 (ref 5–15)
BILIRUBIN TOTAL: 0.3 mg/dL (ref 0.3–1.2)
BUN: 21 mg/dL — AB (ref 6–20)
CALCIUM: 9 mg/dL (ref 8.9–10.3)
CO2: 22 mmol/L (ref 22–32)
CREATININE: 0.89 mg/dL (ref 0.44–1.00)
Chloride: 104 mmol/L (ref 101–111)
GFR calc Af Amer: >60 mL/min (ref >60)
GLUCOSE: 142 mg/dL — AB (ref 65–99)
Potassium: 3.5 mmol/L (ref 3.5–5.1)
Sodium: 138 mmol/L (ref 135–145)
TOTAL PROTEIN: 8.2 g/dL — AB (ref 6.5–8.1)

## 2018-05-16 LAB — CBC
HEMATOCRIT: 39.3 % (ref 36.0–46.0)
Hemoglobin: 13 g/dL (ref 12.0–15.0)
MCH: 31.3 pg (ref 26.0–34.0)
MCHC: 33.1 g/dL (ref 30.0–36.0)
MCV: 94.7 fL (ref 78.0–100.0)
Platelets: 294 10*3/uL (ref 150–400)
RBC: 4.15 MIL/uL (ref 3.87–5.11)
RDW: 13.3 % (ref 11.5–15.5)
WBC: 6 10*3/uL (ref 4.0–10.5)

## 2018-05-16 LAB — RAPID URINE DRUG SCREEN, HOSP PERFORMED
Amphetamines: NOT DETECTED
BARBITURATES: NOT DETECTED
BENZODIAZEPINES: NOT DETECTED
Cocaine: NOT DETECTED
Opiates: NOT DETECTED
Tetrahydrocannabinol: NOT DETECTED

## 2018-05-16 LAB — SALICYLATE LEVEL: Salicylate Lvl: <7 mg/dL (ref 2.8–30.0)

## 2018-05-16 LAB — ACETAMINOPHEN LEVEL: Acetaminophen (Tylenol), Serum: <10 ug/mL — ABNORMAL LOW (ref 10–30)

## 2018-05-16 LAB — ETHANOL

## 2018-05-16 MED ORDER — QUETIAPINE FUMARATE 100 MG PO TABS
200.0000 mg | ORAL_TABLET | Freq: Every day | ORAL | Status: DC
  Administered 2018-05-16: 200 mg via ORAL
  Filled 2018-05-16: qty 2

## 2018-05-16 MED ORDER — ESCITALOPRAM OXALATE 10 MG PO TABS
20.0000 mg | ORAL_TABLET | Freq: Every day | ORAL | Status: DC
  Administered 2018-05-17: 20 mg via ORAL
  Filled 2018-05-16: qty 2

## 2018-05-16 MED ORDER — HYDROCHLOROTHIAZIDE 12.5 MG PO CAPS
12.5000 mg | ORAL_CAPSULE | Freq: Every day | ORAL | Status: DC
  Administered 2018-05-17: 12.5 mg via ORAL
  Filled 2018-05-16: qty 1

## 2018-05-16 MED ORDER — BUTALBITAL-APAP-CAFFEINE 50-325-40 MG PO TABS: 1.0000 | ORAL_TABLET | Freq: Once | ORAL | Status: DC

## 2018-05-16 NOTE — ED Notes (Signed)
Patient transported to CT 

## 2018-05-16 NOTE — ED Notes (Signed)
Pt requesting night time meds.  Dr. Tyrone Nine notified of request.

## 2018-05-16 NOTE — ED Notes (Signed)
TTS assessment in progress via tele psych. 

## 2018-05-16 NOTE — ED Notes (Signed)
CANE PLACED IN CORNER BY LOCKERS.
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
## 2018-05-17 DIAGNOSIS — F329 Major depressive disorder, single episode, unspecified: Secondary | ICD-10-CM

## 2018-05-17 DIAGNOSIS — Z79899 Other long term (current) drug therapy: Secondary | ICD-10-CM

## 2018-05-17 DIAGNOSIS — F203 Undifferentiated schizophrenia: Secondary | ICD-10-CM

## 2018-05-17 MED ORDER — LORAZEPAM 2 MG/ML IJ SOLN
1.0000 mg | Freq: Once | INTRAMUSCULAR | Status: AC
  Administered 2018-05-17: 1 mg via INTRAMUSCULAR
  Filled 2018-05-17: qty 1

## 2018-05-17 MED ORDER — STERILE WATER FOR INJECTION IJ SOLN
INTRAMUSCULAR | Status: AC
  Administered 2018-05-17: 1.2 mL
  Filled 2018-05-17: qty 10

## 2018-05-17 MED ORDER — QUETIAPINE FUMARATE 200 MG PO TABS: 200.0000 mg | ORAL_TABLET | Freq: Every day | ORAL | 0 refills | Status: DC

## 2018-05-17 MED ORDER — BUTALBITAL-APAP-CAFFEINE 50-325-40 MG PO TABS: 1.0000 | ORAL_TABLET | Freq: Once | ORAL | 0 refills | Status: AC

## 2018-05-17 MED ORDER — ESCITALOPRAM OXALATE 20 MG PO TABS: 20.0000 mg | ORAL_TABLET | Freq: Every day | ORAL | 0 refills | Status: DC

## 2018-05-17 MED ORDER — ZIPRASIDONE MESYLATE 20 MG IM SOLR
10.0000 mg | Freq: Once | INTRAMUSCULAR | Status: AC
  Administered 2018-05-17: 10 mg via INTRAMUSCULAR
  Filled 2018-05-17: qty 20

## 2018-05-17 NOTE — BHH Counselor (Signed)
PerDr. Louretta Shorten, MD,  this pt does not require psychiatric hospitalization at this time.Pt is to be discharged from [facility].   (11:09am) TTS writer accepted to contact patient's sister Lucilla Lame and was unsuccessful. A HIPAA complaint message was left asking her to return call. Telephone number was provided.
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
## 2018-05-17 NOTE — ED Notes (Signed)
TTS at bedside. 
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
## 2018-05-17 NOTE — ED Notes (Signed)
Dr. Wyvonnia Dusky informed of pt wanting leave and of pt's agitation.

## 2018-05-17 NOTE — Progress Notes (Signed)
EPD completed 1st opinion saying IVC is recinded, CSW placed in log book and pt's chart.  Please reconsult if future social work needs arise.  CSW signing off, as social work intervention is no longer needed.  Keyarra Rendall F. Blayde Bacigalupi, LCSW, LCAS, CSI Clinical Social Worker Ph: [phone] 

## 2018-09-17 ENCOUNTER — Other Ambulatory Visit: Payer: Self-pay | Admitting: Family Medicine

## 2018-09-17 DIAGNOSIS — Z1231 Encounter for screening mammogram for malignant neoplasm of breast: Secondary | ICD-10-CM

## 2018-10-02 ENCOUNTER — Emergency Department (HOSPITAL_COMMUNITY): Payer: Medicaid Other

## 2018-10-02 ENCOUNTER — Encounter (HOSPITAL_COMMUNITY): Payer: Self-pay | Admitting: Pharmacy Technician

## 2018-10-02 ENCOUNTER — Other Ambulatory Visit: Payer: Self-pay

## 2018-10-02 ENCOUNTER — Emergency Department (HOSPITAL_COMMUNITY)
Admission: EM | Admit: 2018-10-02 | Discharge: 2018-10-02 | Disposition: A | Discharge status: Home / Self Care | Payer: Medicaid Other | Attending: Emergency Medicine | Admitting: Emergency Medicine

## 2018-10-02 DIAGNOSIS — J189 Pneumonia, unspecified organism: Secondary | ICD-10-CM | POA: Diagnosis not present

## 2018-10-02 DIAGNOSIS — Z79899 Other long term (current) drug therapy: Secondary | ICD-10-CM | POA: Diagnosis not present

## 2018-10-02 DIAGNOSIS — I10 Essential (primary) hypertension: Secondary | ICD-10-CM | POA: Diagnosis not present

## 2018-10-02 DIAGNOSIS — Z21 Asymptomatic human immunodeficiency virus [HIV] infection status: Secondary | ICD-10-CM | POA: Diagnosis not present

## 2018-10-02 DIAGNOSIS — R0602 Shortness of breath: Secondary | ICD-10-CM | POA: Diagnosis present

## 2018-10-02 DIAGNOSIS — Z87891 Personal history of nicotine dependence: Secondary | ICD-10-CM | POA: Diagnosis not present

## 2018-10-02 LAB — CBC
HEMATOCRIT: 38.7 % (ref 36.0–46.0)
Hemoglobin: 12.3 g/dL (ref 12.0–15.0)
MCH: 30.4 pg (ref 26.0–34.0)
MCHC: 31.8 g/dL (ref 30.0–36.0)
MCV: 95.8 fL (ref 78.0–100.0)
Platelets: 235 10*3/uL (ref 150–400)
RBC: 4.04 MIL/uL (ref 3.87–5.11)
RDW: 13.3 % (ref 11.5–15.5)
WBC: 10.5 10*3/uL (ref 4.0–10.5)

## 2018-10-02 LAB — COMPREHENSIVE METABOLIC PANEL
ALK PHOS: 77 U/L (ref 38–126)
ALT: 19 U/L (ref 0–44)
AST: 23 U/L (ref 15–41)
Albumin: 3.4 g/dL — ABNORMAL LOW (ref 3.5–5.0)
Anion gap: 9 (ref 5–15)
BILIRUBIN TOTAL: 0.4 mg/dL (ref 0.3–1.2)
BUN: 16 mg/dL (ref 8–23)
CALCIUM: 8.7 mg/dL — AB (ref 8.9–10.3)
CO2: 26 mmol/L (ref 22–32)
CREATININE: 0.82 mg/dL (ref 0.44–1.00)
Chloride: 99 mmol/L (ref 98–111)
GFR calc Af Amer: >60 mL/min (ref >60)
Glucose, Bld: 113 mg/dL — ABNORMAL HIGH (ref 70–99)
POTASSIUM: 3.5 mmol/L (ref 3.5–5.1)
Sodium: 134 mmol/L — ABNORMAL LOW (ref 135–145)
TOTAL PROTEIN: 7.9 g/dL (ref 6.5–8.1)

## 2018-10-02 MED ORDER — LEVOFLOXACIN 500 MG PO TABS: 500.0000 mg | ORAL_TABLET | Freq: Every day | ORAL | 0 refills | Status: DC

## 2018-10-02 MED ORDER — LEVOFLOXACIN 500 MG PO TABS
500.0000 mg | ORAL_TABLET | Freq: Once | ORAL | Status: AC
  Administered 2018-10-02: 500 mg via ORAL
  Filled 2018-10-02: qty 1

## 2018-10-02 NOTE — ED Triage Notes (Signed)
Pt BIB EMS from a clinic with reports of chest wall pain, sob with exertion, febrile. States feels the same as when she had PNA in the past. 133/80, HR 100, 96% RA, 102 temporal, RR 24. 1000mg  Tylenol given PTA.

## 2018-10-02 NOTE — ED Provider Notes (Signed)
[facility] EMERGENCY DEPARTMENT Provider Note   CSN: [id number] Arrival date & time: 10/02/18  1449     History   Chief Complaint Chief Complaint  Patient presents with  . Shortness of Breath    HPI Diane Sutton is a 62 y.o. female.  HPI Patient is a 62 year old female who reports fever and productive cough for the past 48 hours.  She reports some left-sided chest discomfort.  She reports shortness of breath with exertion.  Denies orthopnea.  No unilateral leg swelling.  No history of DVT or pulmonary embolism.  102 temperature for EMS.  One  thousand milligrams of Tylenol given prior to arrival.  No other complaints.  History of HIV per the medical record.  When the patient is asked specifically about it she states she had this long time ago.  She is not currently on any medications for it.  She does not actually know if she has HIV at this time.  She has a history of schizophrenia.   Past Medical History:  Diagnosis Date  . Depression   . HIV (human immunodeficiency virus infection) ([place])    positive-non active  . Hypertension   . Schizophrenia [facility])     Patient Active Problem List   Diagnosis Date Noted  . HIV (human immunodeficiency virus infection) ([place])   . Paranoid schizophrenia ([place]) 02/05/2011  . SKIN RASH 10/04/2010  . ALLERGIC RHINITIS 05/02/2010  . CANDIDIASIS 04/05/2010  . THYROID NODULE, RIGHT 10/07/2009  . PNEUMONIA 09/23/2009  . CERVICAL RADICULOPATHY, LEFT 09/12/2009  . UTI 08/10/2009  . CHEST PAIN UNSPECIFIED 08/10/2009  . 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 INGROWING NAIL 10/05/2008  . PRURITUS, EARS 06/08/2008  . OTALGIA 06/02/2008  . VAGINITIS, BACTERIAL 03/31/2008  . ARTHRITIS, RIGHT KNEE 03/23/2008  . OTHER ABNORMAL BLOOD CHEMISTRY 01/29/2008  . GOITER, MULTINODULAR 08/29/2007  . HYPERTHYROIDISM 08/29/2007  . DEPRESSION 08/29/2007  . MIGRAINE, CHRONIC 08/29/2007  . STRABISMUS 08/29/2007  . AUDITORY HALLUCINATION  08/29/2007    Past Surgical History:  Procedure Laterality Date  . ABDOMINAL HYSTERECTOMY    . ANKLE SURGERY  2004   orif left  . FINGER SURGERY  2004   i/d lt middle finger  . STRABISMUS SURGERY Left 12/11/2007  . STRABISMUS SURGERY Bilateral 01/22/2014   Procedure: REPAIR STRABISMUS BILATERAL EYES;  Surgeon: Derry Skill, MD;  Location: [facility];  Service: Ophthalmology;  Laterality: Bilateral;  . THYROID SURGERY     needle tx     OB History   None      Home Medications    Prior to Admission medications   Medication Sig Start Date End Date Taking? Authorizing Provider  escitalopram (LEXAPRO) 20 MG tablet Take 1 tablet (20 mg total) by mouth daily for 14 days. 05/17/18 10/02/18 Yes Patrecia Pour, NP  hydrochlorothiazide (MICROZIDE) 12.5 MG capsule Take 12.5 mg by mouth daily.  04/04/18  Yes [provider]  ibuprofen (ADVIL,MOTRIN) 200 MG tablet Take 200 mg by mouth every 6 (six) hours as needed for moderate pain.   Yes [provider]  nystatin cream (MYCOSTATIN) Apply 1 application topically 2 (two) times daily. Apply to affected area twice daily for 1 week Patient taking differently: Apply 1 application topically 2 (two) times daily.  05/04/18  Yes Jacqlyn Larsen, PA-C  QUEtiapine (SEROQUEL) 200 MG tablet Take 1 tablet (200 mg total) by mouth at bedtime. 05/17/18  Yes Patrecia Pour, NP    Family History Family History  Problem Relation Age of Onset  . Heart disease Mother   . Colon cancer Neg Hx     Social History Social History   Tobacco Use  . Smoking status: Never Smoker  . Smokeless tobacco: Former Network engineer Use Topics  . Alcohol use: No  . Drug use: No     Allergies   Patient has no known allergies.   Review of Systems Review of Systems  All other systems reviewed and are negative.    Physical Exam Updated Vital Signs BP 130/60 (BP Location: Right Arm)   Pulse 98   Temp 99.4 F (37.4 C) (Oral)    Resp 16   Ht 5' 6.5" (1.689 m)   Wt 127 kg   SpO2 95%   BMI 44.52 kg/m   Physical Exam  Constitutional: She is oriented to person, place, and time. She appears well-developed and well-nourished. No distress.  HENT:  Head: Normocephalic and atraumatic.  Eyes: EOM are normal.  Neck: Normal range of motion.  Cardiovascular: Normal rate, regular rhythm and normal heart sounds.  Pulmonary/Chest: Effort normal and breath sounds normal.  Abdominal: Soft. She exhibits no distension. There is no tenderness.  Musculoskeletal: Normal range of motion.  Neurological: She is alert and oriented to person, place, and time.  Skin: Skin is warm and dry.  Psychiatric: She has a normal mood and affect. Judgment normal.  Nursing note and vitals reviewed.    ED Treatments / Results  Labs (all labs ordered are listed, but only abnormal results are displayed) Labs Reviewed  COMPREHENSIVE METABOLIC PANEL - Abnormal; Notable for the following components:      Result Value   Sodium 134 (*)    Glucose, Bld 113 (*)    Calcium 8.7 (*)    Albumin 3.4 (*)    All other components within normal limits  CBC    EKG None  Radiology Dg Chest 2 View  Result Date: 10/02/2018 CLINICAL DATA:  Right-sided shortness of breath and chest pain since yesterday. History of hypertension, HIV, nonsmoker. EXAM: CHEST - 2 VIEW COMPARISON:  Chest x-ray of February 13, 2016 FINDINGS: The lungs are well-expanded. The interstitial markings are increased in the right lower lobe. The heart and pulmonary vascularity are normal. The mediastinum is normal in width. The trachea is midline. There is calcification in the wall of the aortic arch. The bony thorax is unremarkable. IMPRESSION: Right lower lobe atelectasis or pneumonia. Followup PA and lateral chest X-ray is recommended in 3-4 weeks following trial of antibiotic therapy to ensure resolution and exclude underlying malignancy. Thoracic aortic atherosclerosis. Electronically  Signed   By: David  Martinique M.D.   On: 10/02/2018 15:53    Procedures Procedures (including critical care time)  Medications Ordered in ED Medications - No data to display   Initial Impression / Assessment and Plan / ED Course  I have reviewed the triage vital signs and the nursing notes.  Pertinent labs & imaging results that were available during my care of the patient were reviewed by me and considered in my medical decision making (see chart for details).     Vital signs are stable.  Evidence of pneumonia on chest x-ray.  No hypoxia.  No increased work of breathing.  Patient is stable for discharge from the emergency department.  Home on antibiotics.  Given this possible history of HIV before in the past I have asked that she follow-up with infectious disease for further evaluation.  Patient is encouraged to return  to the ER for new or worsening symptoms  Final Clinical Impressions(s) / ED Diagnoses   Final diagnoses:  None    ED Discharge Orders    None       Jola Schmidt, MD 10/02/18 1731

## 2018-10-02 NOTE — ED Notes (Signed)
Patient verbalizes understanding of discharge instructions. Opportunity for questioning and answers were provided. Ambulatory at discharge in NAD.  

## 2018-10-17 ENCOUNTER — Ambulatory Visit
Admission: RE | Admit: 2018-10-17 | Discharge: 2018-10-17 | Disposition: A | Discharge status: Home / Self Care | Payer: Medicaid Other | Source: Ambulatory Visit | Attending: Family Medicine | Admitting: Family Medicine

## 2018-10-17 ENCOUNTER — Other Ambulatory Visit: Payer: Self-pay | Admitting: Family Medicine

## 2018-10-17 DIAGNOSIS — Z1231 Encounter for screening mammogram for malignant neoplasm of breast: Secondary | ICD-10-CM

## 2018-10-23 ENCOUNTER — Encounter (HOSPITAL_COMMUNITY): Payer: Self-pay

## 2018-10-23 ENCOUNTER — Other Ambulatory Visit: Payer: Self-pay

## 2018-10-23 ENCOUNTER — Inpatient Hospital Stay (HOSPITAL_COMMUNITY)
Admission: EM | Admit: 2018-10-23 | Discharge: 2018-10-26 | DRG: 190 | Disposition: A | Discharge status: Home / Self Care | Payer: Medicaid Other | Attending: Internal Medicine | Admitting: Internal Medicine

## 2018-10-23 ENCOUNTER — Emergency Department (HOSPITAL_COMMUNITY): Payer: Medicaid Other

## 2018-10-23 DIAGNOSIS — J441 Chronic obstructive pulmonary disease with (acute) exacerbation: Principal | ICD-10-CM | POA: Diagnosis present

## 2018-10-23 DIAGNOSIS — I10 Essential (primary) hypertension: Secondary | ICD-10-CM | POA: Diagnosis present

## 2018-10-23 DIAGNOSIS — J9600 Acute respiratory failure, unspecified whether with hypoxia or hypercapnia: Secondary | ICD-10-CM | POA: Diagnosis present

## 2018-10-23 DIAGNOSIS — J9602 Acute respiratory failure with hypercapnia: Secondary | ICD-10-CM | POA: Diagnosis present

## 2018-10-23 DIAGNOSIS — Z79899 Other long term (current) drug therapy: Secondary | ICD-10-CM

## 2018-10-23 DIAGNOSIS — J440 Chronic obstructive pulmonary disease with acute lower respiratory infection: Secondary | ICD-10-CM | POA: Diagnosis present

## 2018-10-23 DIAGNOSIS — Z6841 Body Mass Index (BMI) 40.0 and over, adult: Secondary | ICD-10-CM

## 2018-10-23 DIAGNOSIS — R739 Hyperglycemia, unspecified: Secondary | ICD-10-CM | POA: Diagnosis present

## 2018-10-23 DIAGNOSIS — R0600 Dyspnea, unspecified: Secondary | ICD-10-CM

## 2018-10-23 DIAGNOSIS — F200 Paranoid schizophrenia: Secondary | ICD-10-CM | POA: Diagnosis present

## 2018-10-23 DIAGNOSIS — R0602 Shortness of breath: Secondary | ICD-10-CM

## 2018-10-23 DIAGNOSIS — J9601 Acute respiratory failure with hypoxia: Secondary | ICD-10-CM | POA: Diagnosis present

## 2018-10-23 DIAGNOSIS — F329 Major depressive disorder, single episode, unspecified: Secondary | ICD-10-CM | POA: Diagnosis present

## 2018-10-23 DIAGNOSIS — J209 Acute bronchitis, unspecified: Secondary | ICD-10-CM | POA: Diagnosis present

## 2018-10-23 DIAGNOSIS — Z791 Long term (current) use of non-steroidal anti-inflammatories (NSAID): Secondary | ICD-10-CM

## 2018-10-23 LAB — BLOOD GAS, VENOUS
ACID-BASE EXCESS: 2.9 mmol/L — AB (ref 0.0–2.0)
BICARBONATE: 29.7 mmol/L — AB (ref 20.0–28.0)
O2 Content: 8 L/min
O2 Saturation: 97.3 %
PCO2 VEN: 58.4 mmHg (ref 44.0–60.0)
PH VEN: 7.327 (ref 7.250–7.430)
PO2 VEN: 97.9 mmHg — AB (ref 32.0–45.0)
Patient temperature: 98.6

## 2018-10-23 LAB — I-STAT TROPONIN, ED: TROPONIN I, POC: 0 ng/mL (ref 0.00–0.08)

## 2018-10-23 LAB — COMPREHENSIVE METABOLIC PANEL
ALBUMIN: 3.8 g/dL (ref 3.5–5.0)
ALT: 15 U/L (ref 0–44)
ANION GAP: 6 (ref 5–15)
AST: 20 U/L (ref 15–41)
Alkaline Phosphatase: 80 U/L (ref 38–126)
BUN: 19 mg/dL (ref 8–23)
CO2: 33 mmol/L — AB (ref 22–32)
Calcium: 8.7 mg/dL — ABNORMAL LOW (ref 8.9–10.3)
Chloride: 100 mmol/L (ref 98–111)
Creatinine, Ser: 0.75 mg/dL (ref 0.44–1.00)
GFR calc Af Amer: >60 mL/min (ref >60)
GFR calc non Af Amer: >60 mL/min (ref >60)
GLUCOSE: 117 mg/dL — AB (ref 70–99)
POTASSIUM: 3.8 mmol/L (ref 3.5–5.1)
SODIUM: 139 mmol/L (ref 135–145)
TOTAL PROTEIN: 8.3 g/dL — AB (ref 6.5–8.1)
Total Bilirubin: 0.2 mg/dL — ABNORMAL LOW (ref 0.3–1.2)

## 2018-10-23 LAB — CBC WITH DIFFERENTIAL/PLATELET
Abs Immature Granulocytes: 0.01 10*3/uL (ref 0.00–0.07)
BASOS ABS: 0 10*3/uL (ref 0.0–0.1)
Basophils Relative: 0 %
EOS ABS: 0.5 10*3/uL (ref 0.0–0.5)
EOS PCT: 7 %
HCT: 41.7 % (ref 36.0–46.0)
Hemoglobin: 12.6 g/dL (ref 12.0–15.0)
Immature Granulocytes: 0 %
Lymphocytes Relative: 30 %
Lymphs Abs: 2.1 10*3/uL (ref 0.7–4.0)
MCH: 30.1 pg (ref 26.0–34.0)
MCHC: 30.2 g/dL (ref 30.0–36.0)
MCV: 99.8 fL (ref 80.0–100.0)
Monocytes Absolute: 0.5 10*3/uL (ref 0.1–1.0)
Monocytes Relative: 7 %
NRBC: 0 % (ref 0.0–0.2)
Neutro Abs: 3.8 10*3/uL (ref 1.7–7.7)
Neutrophils Relative %: 56 %
Platelets: 276 10*3/uL (ref 150–400)
RBC: 4.18 MIL/uL (ref 3.87–5.11)
RDW: 13.4 % (ref 11.5–15.5)
WBC: 6.9 10*3/uL (ref 4.0–10.5)

## 2018-10-23 LAB — BRAIN NATRIURETIC PEPTIDE: B Natriuretic Peptide: 15.3 pg/mL (ref 0.0–100.0)

## 2018-10-23 MED ORDER — ALBUTEROL SULFATE (2.5 MG/3ML) 0.083% IN NEBU
5.0000 mg | INHALATION_SOLUTION | Freq: Once | RESPIRATORY_TRACT | Status: AC
  Administered 2018-10-23: 5 mg via RESPIRATORY_TRACT
  Filled 2018-10-23: qty 6

## 2018-10-23 MED ORDER — IOHEXOL 300 MG/ML  SOLN
75.0000 mL | Freq: Once | INTRAMUSCULAR | Status: AC | PRN
  Administered 2018-10-23: 75 mL via INTRAVENOUS

## 2018-10-23 MED ORDER — SODIUM CHLORIDE 0.9 % IJ SOLN
INTRAMUSCULAR | Status: AC
  Filled 2018-10-23: qty 50

## 2018-10-23 MED ORDER — ALBUTEROL SULFATE (2.5 MG/3ML) 0.083% IN NEBU: 5.0000 mg | INHALATION_SOLUTION | Freq: Once | RESPIRATORY_TRACT | Status: DC

## 2018-10-23 MED ORDER — IPRATROPIUM BROMIDE 0.02 % IN SOLN
0.5000 mg | Freq: Once | RESPIRATORY_TRACT | Status: AC
  Administered 2018-10-23: 0.5 mg via RESPIRATORY_TRACT
  Filled 2018-10-23: qty 2.5

## 2018-10-23 NOTE — ED Triage Notes (Signed)
Per EMS:  Pt is from [facility].  Pt was dx and tx for pneumonia 2 weeks ago.  Pt finished ABX.  Increased SOB over the past day.  Pt 93% RA when fire arrived.  Wheezes auscultated bilaterally.  Pt has had 10 mg albuterol, 1 Atrovent, and 125 solu-medrol.

## 2018-10-23 NOTE — ED Provider Notes (Signed)
[facility] DEPT Provider Note   CSN: [id number] Arrival date & time: 10/23/18  1831     History   Chief Complaint Chief Complaint  Patient presents with  . Shortness of Breath    HPI Diane Sutton is a 61 y.o. female with a h/o of morbid obesity, HTN, and paranoid schizophrenia who presents to the emergency department by EMS from [facility] with a chief complaint of dyspnea.  She was seen and evaluated in the ED on 10/02/2018 with a fever, productive cough, and dyspnea and was diagnosed with RLQ pneumonia that was treated with Levaquin.  Repeat CXR was recommended for re-evaluation in 3-4 weeks to ensure resolution and exclude underlying malignancy. She reports that she completed the entire course and her symptoms resolved.  She reports constant, significantly worsening dyspnea that began yesterday.  She reports an associated productive cough and intermittent "achy" pain in the middle of her chest.  States that the pain will last for a few minutes before resolving spontaneously.  She also endorses some swelling to the bilateral legs over the last few days and cough. No known aggravating or alleviating factors.  She reports the episodes of pain began over the last few days.  She denies fever, chills, vomiting, diarrhea, dizziness, lightheadedness, rash, headache, sore throat. or nasal congestion.  A mouth mass the patient was satting at 93% on room air when they arrived with wheezing bilaterally.  She was given 10 mg of albuterol, Atrovent, and 125 mg of Solu-Medrol in route.  No history of DVT or PE.  No history of asthma or COPD.  She received her flu shot this year. She is a never smoker.  She denies alcohol, recreational, or illicit drug use.    States that she was diagnosed with HIV several years ago and was receiving treatment, but reports that it "went away", and she is not currently undergoing treatment?  History is somewhat limited as the  patient is tachypneic and in respiratory distress.  The history is provided by the patient. No language interpreter was used.  Shortness of Breath  Associated symptoms include cough, wheezing, chest pain and leg swelling. Pertinent negatives include no fever, no headaches, no sore throat, no neck pain, no vomiting, no abdominal pain and no rash.    Past Medical History:  Diagnosis Date  . Depression   . HIV (human immunodeficiency virus infection) ([place])    positive-non active  . Hypertension   . Schizophrenia [facility])     Patient Active Problem List   Diagnosis Date Noted  . Acute respiratory failure ([place]) 10/24/2018  . HIV (human immunodeficiency virus infection) ([place])   . Paranoid schizophrenia ([place]) 02/05/2011  . SKIN RASH 10/04/2010  . ALLERGIC RHINITIS 05/02/2010  . CANDIDIASIS 04/05/2010  . THYROID NODULE, RIGHT 10/07/2009  . PNEUMONIA 09/23/2009  . CERVICAL RADICULOPATHY, LEFT 09/12/2009  . UTI 08/10/2009  . CHEST PAIN UNSPECIFIED 08/10/2009  . 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 INGROWING NAIL 10/05/2008  . PRURITUS, EARS 06/08/2008  . OTALGIA 06/02/2008  . VAGINITIS, BACTERIAL 03/31/2008  . ARTHRITIS, RIGHT KNEE 03/23/2008  . OTHER ABNORMAL BLOOD CHEMISTRY 01/29/2008  . GOITER, MULTINODULAR 08/29/2007  . HYPERTHYROIDISM 08/29/2007  . DEPRESSION 08/29/2007  . MIGRAINE, CHRONIC 08/29/2007  . STRABISMUS 08/29/2007  . AUDITORY HALLUCINATION 08/29/2007    Past Surgical History:  Procedure Laterality Date  . ABDOMINAL HYSTERECTOMY    . ANKLE SURGERY  2004   orif left  . FINGER SURGERY  2004   i/d  lt middle finger  . STRABISMUS SURGERY Left 12/11/2007  . STRABISMUS SURGERY Bilateral 01/22/2014   Procedure: REPAIR STRABISMUS BILATERAL EYES;  Surgeon: Derry Skill, MD;  Location: [facility];  Service: Ophthalmology;  Laterality: Bilateral;  . THYROID SURGERY     needle tx     OB History   None      Home Medications    Prior to Admission  medications   Medication Sig Start Date End Date Taking? Authorizing Provider  escitalopram (LEXAPRO) 20 MG tablet Take 1 tablet (20 mg total) by mouth daily for 14 days. 05/17/18 10/23/18 Yes Lord, Asa Saunas, NP  hydrochlorothiazide (MICROZIDE) 12.5 MG capsule Take 12.5 mg by mouth daily.  04/04/18  Yes [provider]  ibuprofen (ADVIL,MOTRIN) 200 MG tablet Take 200 mg by mouth every 6 (six) hours as needed for moderate pain.   Yes [provider]  levofloxacin (LEVAQUIN) 500 MG tablet Take 1 tablet (500 mg total) by mouth daily. 10/02/18  Yes Jola Schmidt, MD  meloxicam (MOBIC) 15 MG tablet Take 15 mg by mouth daily. 10/13/18  Yes [provider]  nystatin cream (MYCOSTATIN) Apply 1 application topically 2 (two) times daily. Apply to affected area twice daily for 1 week Patient taking differently: Apply 1 application topically 2 (two) times daily.  05/04/18  Yes Jacqlyn Larsen, PA-C  PROAIR HFA 108 [phone] Base) MCG/ACT inhaler Take 1 puff by mouth every 6 (six) hours as needed for shortness of breath. 10/08/18  Yes [provider]  QUEtiapine (SEROQUEL) 200 MG tablet Take 1 tablet (200 mg total) by mouth at bedtime. 05/17/18  Yes Patrecia Pour, NP    Family History Family History  Problem Relation Age of Onset  . Heart disease Mother   . Colon cancer Neg Hx     Social History Social History   Tobacco Use  . Smoking status: Never Smoker  . Smokeless tobacco: Former Network engineer Use Topics  . Alcohol use: No  . Drug use: No     Allergies   Patient has no known allergies.   Review of Systems Review of Systems  Constitutional: Negative for activity change, chills, diaphoresis and fever.  HENT: Negative for congestion and sore throat.   Respiratory: Positive for cough, shortness of breath and wheezing.   Cardiovascular: Positive for chest pain and leg swelling.  Gastrointestinal: Negative for abdominal pain, diarrhea, nausea and vomiting.    Genitourinary: Negative for dysuria.  Musculoskeletal: Negative for back pain, neck pain and neck stiffness.  Skin: Negative for rash.  Allergic/Immunologic: Negative for immunocompromised state.  Neurological: Negative for syncope, weakness, numbness and headaches.  Psychiatric/Behavioral: Negative for confusion.     Physical Exam Updated Vital Signs BP (!) 149/83   Pulse 77   Resp 15   SpO2 94%   Physical Exam  Constitutional: She is oriented to person, place, and time. She appears distressed. She is not intubated.  Morbidly obese  HENT:  Head: Normocephalic.  Eyes: Pupils are equal, round, and reactive to light. Conjunctivae and EOM are normal.  Neck: Neck supple. No tracheal deviation present.  Cardiovascular: Normal rate, regular rhythm and normal heart sounds. Exam reveals no gallop and no friction rub.  No murmur heard. Pulses:      Radial pulses are 2+ on the right side, and 2+ on the left side.       Dorsalis pedis pulses are 2+ on the right side, and 2+ on the left side.  Pulmonary/Chest: Accessory muscle usage present. Tachypnea noted. No apnea. She is not intubated. No respiratory distress. She has wheezes in the right upper field, the right middle field, the right lower field, the left upper field, the left middle field and the left lower field. She exhibits no tenderness and no crepitus.  She is tri-podding. Short gasping breaths every 2-3 words.  Abdominal: Soft. Bowel sounds are normal. She exhibits no distension. There is no tenderness. There is no guarding.  Abdomen is obese, but non-tender and non-distended.   Musculoskeletal: She exhibits edema. She exhibits no tenderness.  Symmetric1+ pitting edema in the bilateral lower extremities.  Neurological: She is alert and oriented to person, place, and time.  GCS 15.   Skin: Skin is warm. Capillary refill takes less than 2 seconds. No rash noted. She is not diaphoretic. No cyanosis. Nails show no clubbing.   Psychiatric: Her behavior is normal.  Nursing note and vitals reviewed.  ED Treatments / Results  Labs (all labs ordered are listed, but only abnormal results are displayed) Labs Reviewed  COMPREHENSIVE METABOLIC PANEL - Abnormal; Notable for the following components:      Result Value   CO2 33 (*)    Glucose, Bld 117 (*)    Calcium 8.7 (*)    Total Protein 8.3 (*)    Total Bilirubin 0.2 (*)    All other components within normal limits  BLOOD GAS, VENOUS - Abnormal; Notable for the following components:   pO2, Ven 97.9 (*)    Bicarbonate 29.7 (*)    Acid-Base Excess 2.9 (*)    All other components within normal limits  CBC WITH DIFFERENTIAL/PLATELET  BRAIN NATRIURETIC PEPTIDE  HIV ANTIBODY (ROUTINE TESTING W REFLEX)  MAGNESIUM  I-STAT TROPONIN, ED    EKG EKG Interpretation  Date/Time:  Thursday October 23 2018 19:32:02 EDT Ventricular Rate:  80 PR Interval:    QRS Duration: 86 QT Interval:  352 QTC Calculation: 406 R Axis:   71 Text Interpretation:  Sinus rhythm Low voltage, precordial leads similar pattern to prior 2/17 Confirmed by Aletta Edouard [phone]) on 10/23/2018 7:36:53 PM   Radiology Ct Chest W Contrast  Result Date: 10/23/2018 CLINICAL DATA:  Increased shortness of breath history of pneumonia EXAM: CT CHEST WITH CONTRAST TECHNIQUE: Multidetector CT imaging of the chest was performed during intravenous contrast administration. CONTRAST:  23mL OMNIPAQUE IOHEXOL 300 MG/ML  SOLN COMPARISON:  10/23/2018, 10/02/2018, 02/13/2016 radiographs FINDINGS: Cardiovascular: Nonaneurysmal aorta. Mild aortic atherosclerosis. Soft heart size within normal limits. No pericardial effusion Mediastinum/Nodes: Midline trachea. 2.3 cm heterogeneous right lobe nodule with calcification. No significant mediastinal adenopathy. Esophagus within normal Lungs/Pleura: No focal consolidation or pleural effusion. No pneumothorax. Minimal left lower lobe peribronchial thickening. Upper  Abdomen: No acute abnormality. Musculoskeletal: No chest wall abnormality. No acute or significant osseous findings. IMPRESSION: 1. No focal pneumonia 2. Mild left lower lobe peribronchial thickening which could be secondary to airways inflammation/reactive airways 3. 2.3 cm right thyroid nodule which may be evaluated with nonemergent thyroid ultrasound as indicated Aortic Atherosclerosis (ICD10-I70.0). Electronically Signed   By: Donavan Foil M.D.   On: 10/23/2018 23:03   Dg Chest Port 1 View  Result Date: 10/23/2018 CLINICAL DATA:  Shortness of breath. EXAM: PORTABLE CHEST 1 VIEW COMPARISON:  10/02/2018 and prior radiographs FINDINGS: The cardiomediastinal silhouette is unremarkable. Bilateral LOWER lung opacities/airspace disease again noted and may be slightly increased on the RIGHT. No definite pleural effusion or pneumothorax. No acute bony abnormalities are present. IMPRESSION: Bilateral LOWER  lung opacities/airspace disease, question slightly increased on the RIGHT. These opacities may represent pneumonia and/or atelectasis. Electronically Signed   By: Margarette Canada M.D.   On: 10/23/2018 20:43    Procedures .Critical Care Performed by: Joanne Gavel, PA-C Authorized by: Joanne Gavel, PA-C   Critical care provider statement:    Critical care time (minutes):  35   Critical care time was exclusive of:  Separately billable procedures and treating other patients and teaching time   Critical care was necessary to treat or prevent imminent or life-threatening deterioration of the following conditions:  Respiratory failure   Critical care was time spent personally by me on the following activities:  Ordering and performing treatments and interventions, ordering and review of laboratory studies, ordering and review of radiographic studies, pulse oximetry, re-evaluation of patient's condition, review of old charts, examination of patient, development of treatment plan with patient or surrogate and  evaluation of patient's response to treatment   I assumed direction of critical care for this patient from another provider in my specialty: no     (including critical care time)  Medications Ordered in ED Medications  sodium chloride 0.9 % injection (has no administration in time range)  albuterol (PROVENTIL) (2.5 MG/3ML) 0.083% nebulizer solution 5 mg (5 mg Nebulization Not Given 10/23/18 2339)  albuterol (PROVENTIL) (2.5 MG/3ML) 0.083% nebulizer solution 5 mg (5 mg Nebulization Given 10/23/18 2010)  iohexol (OMNIPAQUE) 300 MG/ML solution 75 mL (75 mLs Intravenous Contrast Given 10/23/18 2244)  albuterol (PROVENTIL) (2.5 MG/3ML) 0.083% nebulizer solution 5 mg (5 mg Nebulization Given 10/23/18 2332)  ipratropium (ATROVENT) nebulizer solution 0.5 mg (0.5 mg Nebulization Given 10/23/18 2338)     Initial Impression / Assessment and Plan / ED Course  I have reviewed the triage vital signs and the nursing notes.  Pertinent labs & imaging results that were available during my care of the patient were reviewed by me and considered in my medical decision making (see chart for details).  Clinical Course as of Oct 24 156  Thu Oct 23, 2312  [id number] 62 year old female arrives here with increased shortness of breath today.  She is received a breathing treatment and is still moderate work of breathing.  She is got audible wheezes and is using accessory muscles.  It is unclear whether she has HIV she said she was positive in the past but has a negative test here.  She is not on retrovirals right now.  We again have to place her on BiPAP for her work of breathing.  Need to be admitted to the hospital.   [MB]    Clinical Course User Index [MB] Hayden Rasmussen, MD    61 year old female with a h/o of morbid obesity, HTN, and paranoid schizophrenia presenting with worsening dyspnea since yesterday.  On initial evaluation, the patient is in respiratory distress with tachypnea and tri-podding.   Inspiratory and expiratory wheezes noted in all fields bilaterally.  No rhonchi or rales.  Patient was given 1 albuterol treatment with Atrovent in route with 125 mg of Solu-Medrol.  Repeated albuterol treatment with no resolution in the patient's overall clinical appearance.  Placed the patient on BiPAP.  RR 26 on arrival with BP 136/76  There is this questionable history of HIV in the patient's chart.  She states that she was diagnosed with HIV several years ago and was treated with medication, but it "went away"?  The only HIV test on file was a negative antibody test in 2010.  Will repeat today.   VBG with pH of 7.33, Bicarb 29.7, PO2 97.9, Acid-Base Excess 2.9.  Metabolic panel with bicarb of 33, but otherwise unremarkable.  Chest x-ray with bilateral lower lung opacities/airspace disease, question slightly increased on the right that may represent pneumonia and/or atelectasis.  The patient's chest x-ray from 10/02/2018 was reviewed, which recommended repeat imaging in 3 to 4 weeks to ensure resolution of her symptoms and to exclude underlying malignancy.  CT chest with no focal pneumonia, but there is mild left and peribronchial thickening which could be secondary to airway reactive airway disease and a 2.3 similar right thyroid nodule for which nonemergent thyroid ultrasound is indicated.  Patient was discussed and independently evaluated by Dr. Melina Copa, attending physician.  She has no history of underlying asthma or COPD.  She is a never smoker and is UTD on her influenza vaccination per the patient.   EKG with sinus rhythm unchanged from previous.  Troponin is negative.  She has no history of CAD.   On re-evaluation, the patient continues appears to be in no distress on bipap. Will reorder albuterol and atrovent treatment. Magnesium is pending at this time.   Doubt influenza given patient's associated symptoms, acute CHF, pneumonia.  PE is less likely as the patient has no tachycardia, no  recent leg swelling or immobilization, no personal or family history, and no active malignancy.  Low suspicion for dissection or [place] the patient has no tachycardia or hypertension.   Consulted the hospitalist team and spoke with Dr. Darrick Meigs who will admit the patient for acute respiratory workup for further evaluation. The patient appears reasonably stabilized for admission considering the current resources, flow, and capabilities available in the ED at this time, and I doubt any other [facility] requiring further screening and/or treatment in the ED prior to admission.   Final Clinical Impressions(s) / ED Diagnoses   Final diagnoses:  Acute respiratory failure, unspecified whether with hypoxia or hypercapnia [facility])    ED Discharge Orders    None       Joanne Gavel, PA-C 10/24/18 0157    Hayden Rasmussen, MD 10/24/18 [id number]

## 2018-10-23 NOTE — Progress Notes (Signed)
Pt transported to CT and back to ED on bipap.  Pt tolerated transport well without incident.

## 2018-10-24 ENCOUNTER — Other Ambulatory Visit: Payer: Self-pay

## 2018-10-24 DIAGNOSIS — J441 Chronic obstructive pulmonary disease with (acute) exacerbation: Secondary | ICD-10-CM | POA: Diagnosis present

## 2018-10-24 DIAGNOSIS — J209 Acute bronchitis, unspecified: Secondary | ICD-10-CM | POA: Diagnosis present

## 2018-10-24 DIAGNOSIS — R739 Hyperglycemia, unspecified: Secondary | ICD-10-CM | POA: Diagnosis present

## 2018-10-24 DIAGNOSIS — I10 Essential (primary) hypertension: Secondary | ICD-10-CM | POA: Diagnosis present

## 2018-10-24 DIAGNOSIS — F200 Paranoid schizophrenia: Secondary | ICD-10-CM | POA: Diagnosis present

## 2018-10-24 DIAGNOSIS — Z6841 Body Mass Index (BMI) 40.0 and over, adult: Secondary | ICD-10-CM | POA: Diagnosis not present

## 2018-10-24 DIAGNOSIS — F329 Major depressive disorder, single episode, unspecified: Secondary | ICD-10-CM | POA: Diagnosis present

## 2018-10-24 DIAGNOSIS — J069 Acute upper respiratory infection, unspecified: Secondary | ICD-10-CM | POA: Diagnosis not present

## 2018-10-24 DIAGNOSIS — J9602 Acute respiratory failure with hypercapnia: Secondary | ICD-10-CM

## 2018-10-24 DIAGNOSIS — R0603 Acute respiratory distress: Secondary | ICD-10-CM

## 2018-10-24 DIAGNOSIS — J9601 Acute respiratory failure with hypoxia: Secondary | ICD-10-CM | POA: Diagnosis present

## 2018-10-24 DIAGNOSIS — J9600 Acute respiratory failure, unspecified whether with hypoxia or hypercapnia: Secondary | ICD-10-CM | POA: Diagnosis present

## 2018-10-24 DIAGNOSIS — Z79899 Other long term (current) drug therapy: Secondary | ICD-10-CM | POA: Diagnosis not present

## 2018-10-24 DIAGNOSIS — Z791 Long term (current) use of non-steroidal anti-inflammatories (NSAID): Secondary | ICD-10-CM | POA: Diagnosis not present

## 2018-10-24 DIAGNOSIS — J440 Chronic obstructive pulmonary disease with acute lower respiratory infection: Secondary | ICD-10-CM | POA: Diagnosis present

## 2018-10-24 LAB — COMPREHENSIVE METABOLIC PANEL
ALK PHOS: 73 U/L (ref 38–126)
ALT: 16 U/L (ref 0–44)
ANION GAP: 9 (ref 5–15)
AST: 17 U/L (ref 15–41)
Albumin: 3.6 g/dL (ref 3.5–5.0)
BILIRUBIN TOTAL: 0.4 mg/dL (ref 0.3–1.2)
BUN: 18 mg/dL (ref 8–23)
CALCIUM: 8.6 mg/dL — AB (ref 8.9–10.3)
CO2: 29 mmol/L (ref 22–32)
CREATININE: 0.7 mg/dL (ref 0.44–1.00)
Chloride: 99 mmol/L (ref 98–111)
Glucose, Bld: 161 mg/dL — ABNORMAL HIGH (ref 70–99)
Potassium: 4 mmol/L (ref 3.5–5.1)
SODIUM: 137 mmol/L (ref 135–145)
TOTAL PROTEIN: 8.3 g/dL — AB (ref 6.5–8.1)

## 2018-10-24 LAB — CBC
HEMATOCRIT: 40.5 % (ref 36.0–46.0)
HEMOGLOBIN: 12.1 g/dL (ref 12.0–15.0)
MCH: 30 pg (ref 26.0–34.0)
MCHC: 29.9 g/dL — AB (ref 30.0–36.0)
MCV: 100.5 fL — AB (ref 80.0–100.0)
Platelets: 245 10*3/uL (ref 150–400)
RBC: 4.03 MIL/uL (ref 3.87–5.11)
RDW: 13.3 % (ref 11.5–15.5)
WBC: 6.8 10*3/uL (ref 4.0–10.5)
nRBC: 0 % (ref 0.0–0.2)

## 2018-10-24 LAB — T-HELPER CELLS (CD4) COUNT (NOT AT ARMC)
CD4 % Helper T Cell: 47 % (ref 33–55)
CD4 T CELL ABS: 390 /uL — AB (ref 400–2700)

## 2018-10-24 LAB — MRSA PCR SCREENING: MRSA by PCR: NEGATIVE

## 2018-10-24 LAB — MAGNESIUM: MAGNESIUM: 2 mg/dL (ref 1.7–2.4)

## 2018-10-24 MED ORDER — ALBUTEROL SULFATE (2.5 MG/3ML) 0.083% IN NEBU
2.5000 mg | INHALATION_SOLUTION | Freq: Four times a day (QID) | RESPIRATORY_TRACT | Status: DC
  Administered 2018-10-24: 2.5 mg via RESPIRATORY_TRACT

## 2018-10-24 MED ORDER — SODIUM CHLORIDE 0.9% FLUSH
3.0000 mL | Freq: Two times a day (BID) | INTRAVENOUS | Status: DC
  Administered 2018-10-24 – 2018-10-25 (×5): 3 mL via INTRAVENOUS

## 2018-10-24 MED ORDER — ALBUTEROL SULFATE (2.5 MG/3ML) 0.083% IN NEBU
INHALATION_SOLUTION | RESPIRATORY_TRACT | Status: AC
  Filled 2018-10-24: qty 3

## 2018-10-24 MED ORDER — ENOXAPARIN SODIUM 40 MG/0.4ML ~~LOC~~ SOLN
40.0000 mg | SUBCUTANEOUS | Status: DC
  Administered 2018-10-24: 40 mg via SUBCUTANEOUS
  Filled 2018-10-24 (×2): qty 0.4

## 2018-10-24 MED ORDER — GUAIFENESIN ER 600 MG PO TB12
1200.0000 mg | ORAL_TABLET | Freq: Two times a day (BID) | ORAL | Status: DC
  Administered 2018-10-24 – 2018-10-26 (×5): 1200 mg via ORAL
  Filled 2018-10-24 (×5): qty 2

## 2018-10-24 MED ORDER — HYDRALAZINE HCL 20 MG/ML IJ SOLN: 10.0000 mg | INTRAMUSCULAR | Status: DC | PRN

## 2018-10-24 MED ORDER — ACETAMINOPHEN 325 MG PO TABS: 650.0000 mg | ORAL_TABLET | Freq: Four times a day (QID) | ORAL | Status: DC | PRN

## 2018-10-24 MED ORDER — ACETAMINOPHEN 650 MG RE SUPP: 650.0000 mg | Freq: Four times a day (QID) | RECTAL | Status: DC | PRN

## 2018-10-24 MED ORDER — ALBUTEROL SULFATE (2.5 MG/3ML) 0.083% IN NEBU: 2.5000 mg | INHALATION_SOLUTION | RESPIRATORY_TRACT | Status: DC | PRN

## 2018-10-24 MED ORDER — ONDANSETRON HCL 4 MG PO TABS: 4.0000 mg | ORAL_TABLET | Freq: Four times a day (QID) | ORAL | Status: DC | PRN

## 2018-10-24 MED ORDER — SODIUM CHLORIDE 0.9% FLUSH: 3.0000 mL | INTRAVENOUS | Status: DC | PRN

## 2018-10-24 MED ORDER — FUROSEMIDE 10 MG/ML IJ SOLN
40.0000 mg | Freq: Once | INTRAMUSCULAR | Status: AC
  Administered 2018-10-24: 40 mg via INTRAVENOUS
  Filled 2018-10-24: qty 4

## 2018-10-24 MED ORDER — QUETIAPINE FUMARATE 200 MG PO TABS
200.0000 mg | ORAL_TABLET | Freq: Every day | ORAL | Status: DC
  Administered 2018-10-24 – 2018-10-25 (×3): 200 mg via ORAL
  Filled 2018-10-24 (×2): qty 1
  Filled 2018-10-24: qty 2

## 2018-10-24 MED ORDER — IPRATROPIUM-ALBUTEROL 0.5-2.5 (3) MG/3ML IN SOLN
3.0000 mL | Freq: Four times a day (QID) | RESPIRATORY_TRACT | Status: DC
  Administered 2018-10-24 – 2018-10-25 (×7): 3 mL via RESPIRATORY_TRACT
  Filled 2018-10-24 (×7): qty 3

## 2018-10-24 MED ORDER — ESCITALOPRAM OXALATE 20 MG PO TABS
20.0000 mg | ORAL_TABLET | Freq: Every day | ORAL | Status: DC
  Administered 2018-10-24 – 2018-10-26 (×3): 20 mg via ORAL
  Filled 2018-10-24 (×3): qty 1

## 2018-10-24 MED ORDER — SODIUM CHLORIDE 0.9 % IV SOLN: 250.0000 mL | INTRAVENOUS | Status: DC | PRN

## 2018-10-24 MED ORDER — IPRATROPIUM BROMIDE 0.02 % IN SOLN
0.5000 mg | Freq: Four times a day (QID) | RESPIRATORY_TRACT | Status: DC
  Administered 2018-10-24: 0.5 mg via RESPIRATORY_TRACT

## 2018-10-24 MED ORDER — ENOXAPARIN SODIUM 60 MG/0.6ML ~~LOC~~ SOLN
60.0000 mg | SUBCUTANEOUS | Status: DC
  Administered 2018-10-25 – 2018-10-26 (×2): 60 mg via SUBCUTANEOUS
  Filled 2018-10-24 (×2): qty 0.6

## 2018-10-24 MED ORDER — METHYLPREDNISOLONE SODIUM SUCC 125 MG IJ SOLR
60.0000 mg | Freq: Four times a day (QID) | INTRAMUSCULAR | Status: DC
  Administered 2018-10-24 – 2018-10-25 (×5): 60 mg via INTRAVENOUS
  Filled 2018-10-24 (×5): qty 2

## 2018-10-24 MED ORDER — ONDANSETRON HCL 4 MG/2ML IJ SOLN: 4.0000 mg | Freq: Four times a day (QID) | INTRAMUSCULAR | Status: DC | PRN

## 2018-10-24 MED ORDER — IPRATROPIUM BROMIDE 0.02 % IN SOLN
RESPIRATORY_TRACT | Status: AC
  Filled 2018-10-24: qty 2.5

## 2018-10-24 NOTE — ED Notes (Signed)
ED TO INPATIENT HANDOFF REPORT  Name/Age/Gender Diane Sutton 62 y.o. female  Code Status Code Status History    Date Active Date Inactive Code Status Order ID Comments User Context   05/16/2018 1859 05/17/2018 1420 Full Code [id number]  Deno Etienne, DO ED      Home/SNF/Other Home  Chief Complaint asthma  Level of Care/Admitting Diagnosis ED Disposition    ED Disposition Condition [place]: [facility] [[id number]]  Level of Care: Stepdown [14]  Admit to SDU based on following criteria: Respiratory Distress:  Frequent assessment and/or intervention to maintain adequate ventilation/respiration, pulmonary toilet, and respiratory treatment.  Diagnosis: Acute respiratory failure ([place]) [518.81.ICD-9-CM]  Admitting Physician: Loree Fee  Attending Physician: Oswald Hillock [[id number]]  Estimated length of stay: 3 - 4 days  Certification:: I certify this patient will need inpatient services for at least 2 midnights  PT Class (Do Not Modify): Inpatient [101]  PT Acc Code (Do Not Modify): Private [1]       Medical History Past Medical History:  Diagnosis Date  . Depression   . HIV (human immunodeficiency virus infection) ([place])    positive-non active  . Hypertension   . Schizophrenia ([place])     Allergies No Known Allergies  IV Location/Drains/Wounds Patient Lines/Drains/Airways Status   Active Line/Drains/Airways    Name:   Placement date:   Placement time:   Site:   Days:   Peripheral IV 10/23/18 Left Hand   10/23/18    -    Hand   1          Labs/Imaging Results for orders placed or performed during the hospital encounter of 10/23/18 (from the past 48 hour(s))  CBC with Differential     Status: None   Collection Time: 10/23/18  7:24 PM  Result Value Ref Range   WBC 6.9 4.0 - 10.5 K/uL   RBC 4.18 3.87 - 5.11 MIL/uL   Hemoglobin 12.6 12.0 - 15.0 g/dL   HCT 41.7 36.0 - 46.0 %   MCV 99.8 80.0 - 100.0 fL   MCH 30.1 26.0 -  34.0 pg   MCHC 30.2 30.0 - 36.0 g/dL   RDW 13.4 11.5 - 15.5 %   Platelets 276 150 - 400 K/uL   nRBC 0.0 0.0 - 0.2 %   Neutrophils Relative % 56 %   Neutro Abs 3.8 1.7 - 7.7 K/uL   Lymphocytes Relative 30 %   Lymphs Abs 2.1 0.7 - 4.0 K/uL   Monocytes Relative 7 %   Monocytes Absolute 0.5 0.1 - 1.0 K/uL   Eosinophils Relative 7 %   Eosinophils Absolute 0.5 0.0 - 0.5 K/uL   Basophils Relative 0 %   Basophils Absolute 0.0 0.0 - 0.1 K/uL   Immature Granulocytes 0 %   Abs Immature Granulocytes 0.01 0.00 - 0.07 K/uL    Comment: Performed at [facility], [place] [address] [place], [place] [address]  Comprehensive metabolic panel     Status: Abnormal   Collection Time: 10/23/18  7:24 PM  Result Value Ref Range   Sodium 139 135 - 145 mmol/L   Potassium 3.8 3.5 - 5.1 mmol/L   Chloride 100 98 - 111 mmol/L   CO2 33 (H) 22 - 32 mmol/L   Glucose, Bld 117 (H) 70 - 99 mg/dL   BUN 19 8 - 23 mg/dL   Creatinine, Ser 0.75 0.44 - 1.00 mg/dL   Calcium 8.7 (L) 8.9 -  10.3 mg/dL   Total Protein 8.3 (H) 6.5 - 8.1 g/dL   Albumin 3.8 3.5 - 5.0 g/dL   AST 20 15 - 41 U/L   ALT 15 0 - 44 U/L   Alkaline Phosphatase 80 38 - 126 U/L   Total Bilirubin 0.2 (L) 0.3 - 1.2 mg/dL   GFR calc non Af Amer >60 >60 mL/min   GFR calc Af Amer >60 >60 mL/min    Comment: (NOTE) The eGFR has been calculated using the CKD EPI equation. This calculation has not been validated in all clinical situations. eGFR's persistently <60 mL/min signify possible Chronic Kidney Disease.    Anion gap 6 5 - 15    Comment: Performed at [facility], [place] [address] [place], [place] [address]  Brain natriuretic peptide     Status: None   Collection Time: 10/23/18  7:59 PM  Result Value Ref Range   B Natriuretic Peptide 15.3 0.0 - 100.0 pg/mL    Comment: Performed at [facility], [place] [address], [place], [place] [address]  Blood gas, venous     Status: Abnormal   Collection Time:  10/23/18  8:50 PM  Result Value Ref Range   O2 Content 8.0 L/min   Delivery systems DRAWN WHILE GETTING HHN    pH, Ven 7.327 7.250 - 7.430   pCO2, Ven 58.4 44.0 - 60.0 mmHg   pO2, Ven 97.9 (H) 32.0 - 45.0 mmHg   Bicarbonate 29.7 (H) 20.0 - 28.0 mmol/L   Acid-Base Excess 2.9 (H) 0.0 - 2.0 mmol/L   O2 Saturation 97.3 %   Patient temperature 98.6    Collection site VEIN    Drawn by COLLECTED BY NURSE    Sample type VENOUS     Comment: Performed at [place] [address] [place], [place] [address]  I-Stat Troponin, ED (not at [facility])     Status: None   Collection Time: 10/23/18  9:04 PM  Result Value Ref Range   Troponin i, poc 0.00 0.00 - 0.08 ng/mL   Comment 3            Comment: Due to the release kinetics of cTnI, a negative result within the first hours of the onset of symptoms does not rule out myocardial infarction with certainty. If myocardial infarction is still suspected, repeat the test at appropriate intervals.    Ct Chest W Contrast  Result Date: 10/23/2018 CLINICAL DATA:  Increased shortness of breath history of pneumonia EXAM: CT CHEST WITH CONTRAST TECHNIQUE: Multidetector CT imaging of the chest was performed during intravenous contrast administration. CONTRAST:  22m OMNIPAQUE IOHEXOL 300 MG/ML  SOLN COMPARISON:  10/23/2018, 10/02/2018, 02/13/2016 radiographs FINDINGS: Cardiovascular: Nonaneurysmal aorta. Mild aortic atherosclerosis. Soft heart size within normal limits. No pericardial effusion Mediastinum/Nodes: Midline trachea. 2.3 cm heterogeneous right lobe nodule with calcification. No significant mediastinal adenopathy. Esophagus within normal Lungs/Pleura: No focal consolidation or pleural effusion. No pneumothorax. Minimal left lower lobe peribronchial thickening. Upper Abdomen: No acute abnormality. Musculoskeletal: No chest wall abnormality. No acute or significant osseous findings. IMPRESSION: 1. No focal pneumonia 2. Mild left lower lobe  peribronchial thickening which could be secondary to airways inflammation/reactive airways 3. 2.3 cm right thyroid nodule which may be evaluated with nonemergent thyroid ultrasound as indicated Aortic Atherosclerosis (ICD10-I70.0). Electronically Signed   By: KDonavan FoilM.D.   On: 10/23/2018 23:03   Dg Chest Port 1 View  Result Date: 10/23/2018 CLINICAL DATA:  Shortness of breath. EXAM:  PORTABLE CHEST 1 VIEW COMPARISON:  10/02/2018 and prior radiographs FINDINGS: The cardiomediastinal silhouette is unremarkable. Bilateral LOWER lung opacities/airspace disease again noted and may be slightly increased on the RIGHT. No definite pleural effusion or pneumothorax. No acute bony abnormalities are present. IMPRESSION: Bilateral LOWER lung opacities/airspace disease, question slightly increased on the RIGHT. These opacities may represent pneumonia and/or atelectasis. Electronically Signed   By: Margarette Canada M.D.   On: 10/23/2018 20:43    Pending Labs Unresulted Labs (From admission, onward)    Start     Ordered   10/23/18 2327  Magnesium  Add-on,   STAT     10/23/18 2327   10/23/18 2045  HIV antibody  Once,   STAT     10/23/18 2044   Signed and Held  CBC  (enoxaparin (LOVENOX)    CrCl >/= 30 ml/min)  Once,   R    Comments:  Baseline for enoxaparin therapy IF NOT ALREADY DRAWN.  Notify MD if PLT < 100 K.    Signed and Held   Signed and Held  Creatinine, serum  (enoxaparin (LOVENOX)    CrCl >/= 30 ml/min)  Once,   R    Comments:  Baseline for enoxaparin therapy IF NOT ALREADY DRAWN.    Signed and Held   Signed and Held  Creatinine, serum  (enoxaparin (LOVENOX)    CrCl >/= 30 ml/min)  Weekly,   R    Comments:  while on enoxaparin therapy    Signed and Held   Signed and Held  CBC  Tomorrow morning,   R     Signed and Held   Signed and Held  Comprehensive metabolic panel  Tomorrow morning,   R     Signed and Held          Vitals/Pain Today's Vitals   10/24/18 0100 10/24/18 0115 10/24/18  0130 10/24/18 0158  BP: 134/74 124/69 119/70   Pulse: 72 76 78   Resp: _0 Temp:    (!) 97.1 F (36.2 C)  TempSrc:    Rectal  SpO2: 98% 95% 96%   PainSc:        Isolation Precautions No active isolations  Medications Medications  sodium chloride 0.9 % injection (has no administration in time range)  albuterol (PROVENTIL) (2.5 MG/3ML) 0.083% nebulizer solution 5 mg (5 mg Nebulization Not Given 10/23/18 2339)  albuterol (PROVENTIL) (2.5 MG/3ML) 0.083% nebulizer solution 5 mg (5 mg Nebulization Given 10/23/18 2010)  iohexol (OMNIPAQUE) 300 MG/ML solution 75 mL (75 mLs Intravenous Contrast Given 10/23/18 2244)  albuterol (PROVENTIL) (2.5 MG/3ML) 0.083% nebulizer solution 5 mg (5 mg Nebulization Given 10/23/18 2332)  ipratropium (ATROVENT) nebulizer solution 0.5 mg (0.5 mg Nebulization Given 10/23/18 2338)    Mobility walks

## 2018-10-24 NOTE — Progress Notes (Signed)
Patient removed from BiPAP and placed on 4 L [place]. O2 will be weaned as tolerated by patient.

## 2018-10-24 NOTE — Progress Notes (Signed)
Patient was admitted early this morning after midnight and H&P has been reviewed and I am in current agreement with assessment plan done by Dr. Eleonore Chiquito.  Additional changes the plan of care been made accordingly. The Patient is a 62 year old morbidly obese African-American female with a past medical history significant for depression, hypertension, paranoid schizophrenia, history of HIV not on any current medications along with other comorbidities who presents with a chief complaint of shortness of breath.  Patient was seen in the ED on 10/02/2018 with a fever productive cough and dyspnea and was diagnosed with a pneumonia and was given Levaquin.  Repeat chest x-ray was recommended for 3 to 4 weeks after to ensure resolution.  Patient symptoms have resolved since she completed a course of antibiotics however she had sudden onset dyspnea yesterday with a complaint of productive cough with clear phlegm.  CT of the chest was done which showed no pneumonia but did show peribronchial thickening to the airway.  Because of her hypoxia and hypercarbia she was placed on BiPAP and was subsequently weaned off 4 L.  She started on IV Solu-Medrol and will continue this currently.  We will continue her home medications and check her HIV CD4 count and viral load.  We will continue to monitor patient's clinical response to intervention and clinical course and repeat blood work in the a.m. along with imaging of the chest with a CXR.

## 2018-10-24 NOTE — Progress Notes (Signed)
Patient transported on BiPAP to ICU/SD room 1226 without event.

## 2018-10-24 NOTE — H&P (Signed)
TRH H&P    Patient Demographics:    Diane Sutton, is a 62 y.o. female  MRN: [id number]  DOB - February 23, 1956  Admit Date - 10/23/2018  Referring MD/NP/PA: Joline Maxcy  Outpatient Primary MD for the patient is Eston Esters, NP  Patient coming from: Home  Chief complaint-shortness of breath   HPI:    Diane Sutton  is a 62 y.o. female, with history of schizophrenia, hypertension, depression, morbid obesity came to ED with complaints of shortness of breath.  Patient was seen in the ED on 10/02/2018 with fever, productive cough and dyspnea and was diagnosed with pneumonia.  She was given Levaquin.  Repeat chest x-ray was recommended for reproduction 3 to 4 weeks to ensure resolution.  Patient's symptoms had resolved she completed a course of antibiotics. This episode of dyspnea started yesterday complaint of productive cough with clear phlegm.  Patient denies fever or chills.  No nausea vomiting or diarrhea. Patient had CT chest which showed no pneumonia, only shows peribronchial thickening could be due to airway inflammation Patient put on BiPAP Denies dysuria Denies abdominal pain   Review of systems:      All other systems reviewed and are negative.   With Past History of the following :    Past Medical History:  Diagnosis Date  . Depression   . HIV (human immunodeficiency virus infection) ([place])    positive-non active  . Hypertension   . Schizophrenia [facility])       Past Surgical History:  Procedure Laterality Date  . ABDOMINAL HYSTERECTOMY    . ANKLE SURGERY  2004   orif left  . FINGER SURGERY  2004   i/d lt middle finger  . STRABISMUS SURGERY Left 12/11/2007  . STRABISMUS SURGERY Bilateral 01/22/2014   Procedure: REPAIR STRABISMUS BILATERAL EYES;  Surgeon: Derry Skill, MD;  Location: [facility];  Service: Ophthalmology;  Laterality: Bilateral;  . THYROID  SURGERY     needle tx      Social History:      Social History   Tobacco Use  . Smoking status: Never Smoker  . Smokeless tobacco: Former Network engineer Use Topics  . Alcohol use: No       Family History :     Family History  Problem Relation Age of Onset  . Heart disease Mother   . Colon cancer Neg Hx       Home Medications:   Prior to Admission medications   Medication Sig Start Date End Date Taking? Authorizing Provider  escitalopram (LEXAPRO) 20 MG tablet Take 1 tablet (20 mg total) by mouth daily for 14 days. 05/17/18 10/23/18 Yes Lord, Asa Saunas, NP  hydrochlorothiazide (MICROZIDE) 12.5 MG capsule Take 12.5 mg by mouth daily.  04/04/18  Yes [provider]  ibuprofen (ADVIL,MOTRIN) 200 MG tablet Take 200 mg by mouth every 6 (six) hours as needed for moderate pain.   Yes [provider]  levofloxacin (LEVAQUIN) 500 MG tablet Take 1 tablet (500 mg total) by mouth daily. 10/02/18  Yes [place],  Lennette Bihari, MD  meloxicam (MOBIC) 15 MG tablet Take 15 mg by mouth daily. 10/13/18  Yes [provider]  nystatin cream (MYCOSTATIN) Apply 1 application topically 2 (two) times daily. Apply to affected area twice daily for 1 week Patient taking differently: Apply 1 application topically 2 (two) times daily.  05/04/18  Yes Jacqlyn Larsen, PA-C  PROAIR HFA 108 [phone] Base) MCG/ACT inhaler Take 1 puff by mouth every 6 (six) hours as needed for shortness of breath. 10/08/18  Yes [provider]  QUEtiapine (SEROQUEL) 200 MG tablet Take 1 tablet (200 mg total) by mouth at bedtime. 05/17/18  Yes Patrecia Pour, NP     Allergies:    No Known Allergies   Physical Exam:   Vitals  Blood pressure (!) 149/83, pulse 77, resp. rate 15, SpO2 94 %.  1.  General: Patient on BiPAP  2. Psychiatric:  Intact judgement and  insight, awake alert, oriented x 3.  3. Neurologic: No focal neurological deficits, all cranial nerves intact.Strength 5/5 all 4 extremities,  sensation intact all 4 extremities, plantars down going.  4. Eyes :  anicteric sclerae, moist conjunctivae with no lid lag. PERRLA.  5. ENMT:  Patient on BiPAP  6. Neck:  supple, no cervical lymphadenopathy appriciated, No thyromegaly  7. Respiratory : Bilateral wheezing auscultated  8. Cardiovascular : RRR, no gallops, rubs or murmurs, no leg edema  9. Gastrointestinal:  Positive bowel sounds, abdomen soft, non-tender to palpation,no hepatosplenomegaly, no rigidity or guarding       10. Skin:  No cyanosis, normal texture and turgor, no rash, lesions or ulcers  11.Musculoskeletal:  Good muscle tone,  joints appear normal , no effusions,  normal range of motion    Data Review:    CBC Recent Labs  Lab 10/23/18 1924  WBC 6.9  HGB 12.6  HCT 41.7  PLT 276  MCV 99.8  MCH 30.1  MCHC 30.2  RDW 13.4  LYMPHSABS 2.1  MONOABS 0.5  EOSABS 0.5  BASOSABS 0.0   ------------------------------------------------------------------------------------------------------------------  Results for orders placed or performed during the hospital encounter of 10/23/18 (from the past 48 hour(s))  CBC with Differential     Status: None   Collection Time: 10/23/18  7:24 PM  Result Value Ref Range   WBC 6.9 4.0 - 10.5 K/uL   RBC 4.18 3.87 - 5.11 MIL/uL   Hemoglobin 12.6 12.0 - 15.0 g/dL   HCT 41.7 36.0 - 46.0 %   MCV 99.8 80.0 - 100.0 fL   MCH 30.1 26.0 - 34.0 pg   MCHC 30.2 30.0 - 36.0 g/dL   RDW 13.4 11.5 - 15.5 %   Platelets 276 150 - 400 K/uL   nRBC 0.0 0.0 - 0.2 %   Neutrophils Relative % 56 %   Neutro Abs 3.8 1.7 - 7.7 K/uL   Lymphocytes Relative 30 %   Lymphs Abs 2.1 0.7 - 4.0 K/uL   Monocytes Relative 7 %   Monocytes Absolute 0.5 0.1 - 1.0 K/uL   Eosinophils Relative 7 %   Eosinophils Absolute 0.5 0.0 - 0.5 K/uL   Basophils Relative 0 %   Basophils Absolute 0.0 0.0 - 0.1 K/uL   Immature Granulocytes 0 %   Abs Immature Granulocytes 0.01 0.00 - 0.07 K/uL    Comment:  Performed at [facility], [place] [address], [place], [place] [address]  Comprehensive metabolic panel     Status: Abnormal   Collection Time: 10/23/18  7:24 PM  Result  Value Ref Range   Sodium 139 135 - 145 mmol/L   Potassium 3.8 3.5 - 5.1 mmol/L   Chloride 100 98 - 111 mmol/L   CO2 33 (H) 22 - 32 mmol/L   Glucose, Bld 117 (H) 70 - 99 mg/dL   BUN 19 8 - 23 mg/dL   Creatinine, Ser 0.75 0.44 - 1.00 mg/dL   Calcium 8.7 (L) 8.9 - 10.3 mg/dL   Total Protein 8.3 (H) 6.5 - 8.1 g/dL   Albumin 3.8 3.5 - 5.0 g/dL   AST 20 15 - 41 U/L   ALT 15 0 - 44 U/L   Alkaline Phosphatase 80 38 - 126 U/L   Total Bilirubin 0.2 (L) 0.3 - 1.2 mg/dL   GFR calc non Af Amer >60 >60 mL/min   GFR calc Af Amer >60 >60 mL/min    Comment: (NOTE) The eGFR has been calculated using the CKD EPI equation. This calculation has not been validated in all clinical situations. eGFR's persistently <60 mL/min signify possible Chronic Kidney Disease.    Anion gap 6 5 - 15    Comment: Performed at [facility], [place] [address], [place], [place] [address]  Brain natriuretic peptide     Status: None   Collection Time: 10/23/18  7:59 PM  Result Value Ref Range   B Natriuretic Peptide 15.3 0.0 - 100.0 pg/mL    Comment: Performed at [facility], [place] [address] [place], [place] [address]  Blood gas, venous     Status: Abnormal   Collection Time: 10/23/18  8:50 PM  Result Value Ref Range   O2 Content 8.0 L/min   Delivery systems DRAWN WHILE GETTING HHN    pH, Ven 7.327 7.250 - 7.430   pCO2, Ven 58.4 44.0 - 60.0 mmHg   pO2, Ven 97.9 (H) 32.0 - 45.0 mmHg   Bicarbonate 29.7 (H) 20.0 - 28.0 mmol/L   Acid-Base Excess 2.9 (H) 0.0 - 2.0 mmol/L   O2 Saturation 97.3 %   Patient temperature 98.6    Collection site VEIN    Drawn by COLLECTED BY NURSE    Sample type VENOUS     Comment: Performed at [place] [address] [place], [place] [address]    I-Stat Troponin, ED (not at [facility])     Status: None   Collection Time: 10/23/18  9:04 PM  Result Value Ref Range   Troponin i, poc 0.00 0.00 - 0.08 ng/mL   Comment 3            Comment: Due to the release kinetics of cTnI, a negative result within the first hours of the onset of symptoms does not rule out myocardial infarction with certainty. If myocardial infarction is still suspected, repeat the test at appropriate intervals.     Chemistries  Recent Labs  Lab 10/23/18 1924  NA 139  K 3.8  CL 100  CO2 33*  GLUCOSE 117*  BUN 19  CREATININE 0.75  CALCIUM 8.7*  AST 20  ALT 15  ALKPHOS 80  BILITOT 0.2*   ------------------------------------------------------------------------------------------------------------------  ------------------------------------------------------------------------------------------------------------------ GFR: CrCl cannot be calculated (Unknown ideal weight.). Liver Function Tests: Recent Labs  Lab 10/23/18 1924  AST 20  ALT 15  ALKPHOS 80  BILITOT 0.2*  PROT 8.3*  ALBUMIN 3.8       Imaging Results:    Ct Chest W Contrast  Result Date: 10/23/2018 CLINICAL DATA:  Increased shortness of breath history of pneumonia EXAM: CT CHEST  WITH CONTRAST TECHNIQUE: Multidetector CT imaging of the chest was performed during intravenous contrast administration. CONTRAST:  19m OMNIPAQUE IOHEXOL 300 MG/ML  SOLN COMPARISON:  10/23/2018, 10/02/2018, 02/13/2016 radiographs FINDINGS: Cardiovascular: Nonaneurysmal aorta. Mild aortic atherosclerosis. Soft heart size within normal limits. No pericardial effusion Mediastinum/Nodes: Midline trachea. 2.3 cm heterogeneous right lobe nodule with calcification. No significant mediastinal adenopathy. Esophagus within normal Lungs/Pleura: No focal consolidation or pleural effusion. No pneumothorax. Minimal left lower lobe peribronchial thickening. Upper Abdomen: No acute abnormality. Musculoskeletal: No chest wall  abnormality. No acute or significant osseous findings. IMPRESSION: 1. No focal pneumonia 2. Mild left lower lobe peribronchial thickening which could be secondary to airways inflammation/reactive airways 3. 2.3 cm right thyroid nodule which may be evaluated with nonemergent thyroid ultrasound as indicated Aortic Atherosclerosis (ICD10-I70.0). Electronically Signed   By: KDonavan FoilM.D.   On: 10/23/2018 23:03   Dg Chest Port 1 View  Result Date: 10/23/2018 CLINICAL DATA:  Shortness of breath. EXAM: PORTABLE CHEST 1 VIEW COMPARISON:  10/02/2018 and prior radiographs FINDINGS: The cardiomediastinal silhouette is unremarkable. Bilateral LOWER lung opacities/airspace disease again noted and may be slightly increased on the RIGHT. No definite pleural effusion or pneumothorax. No acute bony abnormalities are present. IMPRESSION: Bilateral LOWER lung opacities/airspace disease, question slightly increased on the RIGHT. These opacities may represent pneumonia and/or atelectasis. Electronically Signed   By: JMargarette CanadaM.D.   On: 10/23/2018 20:43    My personal review of EKG: Rhythm nsr   Assessment & Plan:    Active Problems:   [place] respiratory failure ([place]   1. Acute hypercarbic respiratory failure-ABG shows pH 7.327, PCO2 58.4.  Patient is currently on BiPAP, likely from underlying COPD exacerbation.  Start Solu-Medrol 60 mg every 6 hours, DuoNeb every 6 hours  2. Paranoid schizophrenia-continue Seroquel, Lexapro  3. Hypertension-we will hold HCTZ, start hydralazine PRN   DVT Prophylaxis-   Lovenox   AM Labs Ordered, also please review Full Orders  Family Communication: Admission, patients condition and plan of care including tests being ordered have been discussed with the patient  who indicate understanding and agree with the plan and Code Status.    Code Status:  Full code  Admission status: Inpatient: Based on patients clinical presentation and evaluation of above clinical data, I  have made determination that patient meets Inpatient criteria at this time.Patient on BiPAP, IV Solu-Medrol  Time spent in minutes : 60 minutes   GOswald HillockM.D on 10/24/2018 at 1:10 AM  Between 7am to 7pm - Pager - [phone]. After 7pm go to [email or web address] - password [email or web address] - Office  [phone]

## 2018-10-24 NOTE — Progress Notes (Signed)
PHARMACY NOTE DOSE ADJUSTMENT  LMWH 40 mg q24 >> LMWH 60 q24 dose adjusted for BMI > 30 Eudelia Bunch, Pharm.D [phone] 10/24/2018 1:07 PM

## 2018-10-24 NOTE — Progress Notes (Signed)
Received patient from ICU, VS obtained, telemetry monitor applied, oriented to unit, call light placed in reach  

## 2018-10-25 ENCOUNTER — Inpatient Hospital Stay (HOSPITAL_COMMUNITY): Payer: Medicaid Other

## 2018-10-25 DIAGNOSIS — R739 Hyperglycemia, unspecified: Secondary | ICD-10-CM

## 2018-10-25 DIAGNOSIS — J209 Acute bronchitis, unspecified: Secondary | ICD-10-CM

## 2018-10-25 LAB — HIV-1 RNA QUANT-NO REFLEX-BLD
HIV 1 RNA Quant: <20 copies/mL
LOG10 HIV-1 RNA: UNDETERMINED log10copy/mL

## 2018-10-25 LAB — HIV ANTIBODY (ROUTINE TESTING W REFLEX)
HIV SCREEN 4TH GENERATION: NONREACTIVE
HIV Screen 4th Generation wRfx: NONREACTIVE

## 2018-10-25 MED ORDER — SODIUM CHLORIDE 0.9 % IV SOLN: 500.0000 mg | INTRAVENOUS | Status: DC

## 2018-10-25 MED ORDER — METHYLPREDNISOLONE SODIUM SUCC 125 MG IJ SOLR
60.0000 mg | Freq: Two times a day (BID) | INTRAMUSCULAR | Status: DC
  Administered 2018-10-25 – 2018-10-26 (×2): 60 mg via INTRAVENOUS
  Filled 2018-10-25 (×2): qty 2

## 2018-10-25 MED ORDER — FUROSEMIDE 10 MG/ML IJ SOLN
40.0000 mg | Freq: Once | INTRAMUSCULAR | Status: AC
  Administered 2018-10-25: 40 mg via INTRAVENOUS
  Filled 2018-10-25: qty 4

## 2018-10-25 MED ORDER — IPRATROPIUM-ALBUTEROL 0.5-2.5 (3) MG/3ML IN SOLN
3.0000 mL | Freq: Three times a day (TID) | RESPIRATORY_TRACT | Status: DC
  Administered 2018-10-26: 3 mL via RESPIRATORY_TRACT
  Filled 2018-10-25: qty 3

## 2018-10-25 MED ORDER — AMOXICILLIN-POT CLAVULANATE 875-125 MG PO TABS
1.0000 | ORAL_TABLET | Freq: Two times a day (BID) | ORAL | Status: DC
  Administered 2018-10-25 – 2018-10-26 (×3): 1 via ORAL
  Filled 2018-10-25 (×3): qty 1

## 2018-10-25 NOTE — Plan of Care (Signed)
Patient tolerating diet, denies pain.  Able to wean patient to room air with oxygen saturation in the low to mid 90's.  Sister in to visit.

## 2018-10-25 NOTE — Progress Notes (Signed)
Bipap not indicated at this time. 

## 2018-10-25 NOTE — Progress Notes (Signed)
SATURATION QUALIFICATIONS: (This note is used to comply with regulatory documentation for home oxygen)  Patient Saturations on Room Air at Rest = 95%  Patient Saturations on Room Air while Ambulating = 92%  Patient Saturations on n/aLiters of oxygen while Ambulating = n/a  Please briefly explain why patient needs home oxygen: patient does meet criteria for  home oxygen at this time

## 2018-10-25 NOTE — Progress Notes (Addendum)
PROGRESS NOTE    Deshawn Skelley  YQI:[id number] DOB: September 28, 1956 DOA: 10/23/2018 PCP: Eston Esters, NP  Brief Narrative:  The Patient is a 62 year old morbidly obese African-American female with a past medical history significant for depression, hypertension, paranoid schizophrenia, history of HIV not on any current medications along with other comorbidities who presents with a chief complaint of shortness of breath.  Patient was seen in the ED on 10/02/2018 with a fever productive cough and dyspnea and was diagnosed with a pneumonia and was given Levaquin.  Repeat chest x-ray was recommended for 3 to 4 weeks after to ensure resolution.  Patient symptoms have resolved since she completed a course of antibiotics however she had sudden onset dyspnea yesterday with a complaint of productive cough with clear phlegm.  CT of the chest was done which showed no pneumonia but did show peribronchial thickening to the airway.  Because of her hypoxia and hypercarbia she was placed on BiPAP and was subsequently weaned off 4 L.  She started on IV Solu-Medrol and will continue this currently.  We will continue her home medications and check her HIV CD4 count and viral load.  We will continue to monitor patient's clinical response to intervention and clinical course and repeat blood work in the a.m. along with imaging of the    Assessment & Plan:   Active Problems:   Acute respiratory failure (HCC)  Acute hypercarbic respiratory failure requiring noninvasive positive pressure ventilation with BiPAP initially and now on nasal cannula likely in the setting of acute bronchitis/COPD Exacebation -CT scan of the chest with contrast on 10/23/2018 showed no focal pneumonia, there is mild left lower lobe peribronchial thickening which could be secondary to airway inflammation or reactive airways -ABG on admission showed 7.327, 58.4, 97.9, 29.7, and 98.6% on 8 L of oxygen -Patient was on BiPAP and then weaned from  BiPAP to 4 L and is now down to 1 L patient is currently on BiPAP -Patient was started on IV Solu-Medrol 60 mg every 6 now this is been weaned to 60 mg every 12 and will likely wean further and send patient home with p.o. prednisone taper  -Started flutter valve, incentive spirometer -Continue with duo nebs 3 mL's every 6 as scheduled -Started patient on Augmentin 1 tab p.o. every 12 in case she has an underlying upper airway infection and that she was just recently hospitalized with a pneumonia -Chest x-ray on 10/23/2018 showed bilateral lung opacities/airspace disease with questionable slightly increased on the right.  These opacities may represent pneumonia and/or atelectasis -We have repeated the chest x-ray this a.m. currently pending to be done -Continue with guaifenesin 1200 g p.o. twice daily -Continuous pulse oximetry and maintain O2 saturations greater than 90% -Continue with supplemental oxygen via nasal cannula and wean O2 as tolerated  -Will obtain a home amatory screen today  Mild Lower Extremity edema -We will give patient 1 dose of IV Lasix 40 mg and reevaluate in the morning -Currently her home hydrochlorothiazide is being held  Paranoid Schizophrenia -Continue with Quetiapine 200 mg p.o. nightly along with escitalopram 20 mg p.o. daily  Hypertension -Continue to hold home Hydrochlorothiazide -Continue with hydralazine 10 mg IV every 4 PRN for systolic blood pressure greater than 387 or diastolic blood pressure greater than 100  Hyperglycemia in the setting of prediabetes/impaired fasting glucose -Blood Sugars have been ranging from 113-161 on BMP/CMP -Check HbA1c as last hemoglobin A1c was 5.7 -Blood sugar slightly worsened in the setting of IV steroid demargination  however she was hyperglycemic before steroids were even initiated -Continue monitor blood sugars and if continue to be elevated will place on sensitive NovoLog sliding scale insulin AC  Morbid  Obesity -Estimated body mass index is 48.22 kg/m as calculated from the following:   Height as of this encounter: 5\' 6"  (1.676 m).   Weight as of this encounter: 135.5 kg. -Weight Loss Counseling given    Hx of HIV ? -Has a history of positive non-active HIV -HIV screen here showed as nonreactive -CD4 count was 390 is slightly low -HIV RNA quant is still pending  DVT prophylaxis: Enoxaparin 60 mg every 24h Code Status: FULL CODE Family Communication: No family present at bedside Disposition Plan: Anticipate D/C Home in the next 24-48 hours  Consultants:   None   Procedures: None  Antimicrobials: Anti-infectives (From admission, onward)   Start     Dose/Rate Route Frequency Ordered Stop   10/25/18 1300  amoxicillin-clavulanate (AUGMENTIN) 875-125 MG per tablet 1 tablet     1 tablet Oral Every 12 hours 10/25/18 1245     10/25/18 1245  azithromycin (ZITHROMAX) 500 mg in sodium chloride 0.9 % 250 mL IVPB  Status:  Discontinued     500 mg 250 mL/hr over 60 Minutes Intravenous Every 24 hours 10/25/18 1243 10/25/18 1245     Subjective: Seen and examined at bedside and she was sitting in the chair and states that her breathing is much better than what she came in.  No chest pain, lightheadedness or dizziness.  Had some mild lower extremity edema which he states is chronic.  No nausea or vomiting.  No other concerns or complaints at this time and hoping to get off of oxygen later today.  Objective: Vitals:   10/25/18 0124 10/25/18 0524 10/25/18 0911 10/25/18 1037  BP:  (!) 163/97  118/67  Pulse:  93  86  Resp:  16    Temp:  (!) 97.5 F (36.4 C)    TempSrc:  Oral    SpO2: 97% 98% 95% 99%  Weight:      Height:        Intake/Output Summary (Last 24 hours) at 10/25/2018 1241 Last data filed at 10/25/2018 0500 Gross per 24 hour  Intake 0 ml  Output 200 ml  Net -200 ml   Filed Weights   10/24/18 0200  Weight: 135.5 kg    Examination: Physical Exam:  Constitutional:  WN/WD morbidly obese AAF in NAD and appears calm and comfortable Eyes: Lids and conjunctivae normal, sclerae anicteric  ENMT: External Ears, Nose appear normal. Grossly normal hearing. Mucous membranes are moist. Neck: Appears normal, supple, no cervical masses, normal ROM, no appreciable thyromegaly; no JVD Respiratory: Diminished to auscultation bilaterally, no wheezing, rales, rhonchi or crackles. Normal respiratory effort and patient is not tachypenic. No accessory muscle use.  Patient is wearing supplemental oxygen via nasal cannula but is only on 1 L Cardiovascular: RRR, no murmurs / rubs / gallops. S1 and S2 auscultated. 1+ LE extremity edema.  Abdomen: Soft, non-tender, Distended 2/2 to body habitus. No masses palpated. No appreciable hepatosplenomegaly. Bowel sounds positive x4.  GU: Deferred. Musculoskeletal: No clubbing / cyanosis of digits/nails Normal strength and muscle tone.  Skin: No rashes, lesions, ulcers on a limited skin eval. No induration; Warm and dry.  Neurologic: CN 2-12 grossly intact with no focal deficits.  Romberg sign and cerebellar reflexes not assessed.  Psychiatric: Normal judgment and insight. Alert and oriented x 3. Normal mood and appropriate affect.  Data Reviewed: I have personally reviewed following labs and imaging studies  CBC: Recent Labs  Lab 10/23/18 1924 10/24/18 0312  WBC 6.9 6.8  NEUTROABS 3.8  --   HGB 12.6 12.1  HCT 41.7 40.5  MCV 99.8 100.5*  PLT 276 962   Basic Metabolic Panel: Recent Labs  Lab 10/23/18 1924 10/24/18 0312  NA 139 137  K 3.8 4.0  CL 100 99  CO2 33* 29  GLUCOSE 117* 161*  BUN 19 18  CREATININE 0.75 0.70  CALCIUM 8.7* 8.6*  MG  --  2.0   GFR: Estimated Creatinine Clearance: 100.7 mL/min (by C-G formula based on SCr of 0.7 mg/dL). Liver Function Tests: Recent Labs  Lab 10/23/18 1924 10/24/18 0312  AST 20 17  ALT 15 16  ALKPHOS 80 73  BILITOT 0.2* 0.4  PROT 8.3* 8.3*  ALBUMIN 3.8 3.6   No results  for input(s): LIPASE, AMYLASE in the last 168 hours. No results for input(s): AMMONIA in the last 168 hours. Coagulation Profile: No results for input(s): INR, PROTIME in the last 168 hours. Cardiac Enzymes: No results for input(s): CKTOTAL, CKMB, CKMBINDEX, TROPONINI in the last 168 hours. BNP (last 3 results) No results for input(s): PROBNP in the last 8760 hours. HbA1C: No results for input(s): HGBA1C in the last 72 hours. CBG: No results for input(s): GLUCAP in the last 168 hours. Lipid Profile: No results for input(s): CHOL, HDL, LDLCALC, TRIG, CHOLHDL, LDLDIRECT in the last 72 hours. Thyroid Function Tests: No results for input(s): TSH, T4TOTAL, FREET4, T3FREE, THYROIDAB in the last 72 hours. Anemia Panel: No results for input(s): VITAMINB12, FOLATE, FERRITIN, TIBC, IRON, RETICCTPCT in the last 72 hours. Sepsis Labs: No results for input(s): PROCALCITON, LATICACIDVEN in the last 168 hours.  Recent Results (from the past 240 hour(s))  MRSA PCR Screening     Status: None   Collection Time: 10/24/18  6:25 AM  Result Value Ref Range Status   MRSA by PCR NEGATIVE NEGATIVE Final    Comment:        The GeneXpert MRSA Assay (FDA approved for NASAL specimens only), is one component of a comprehensive MRSA colonization surveillance program. It is not intended to diagnose MRSA infection nor to guide or monitor treatment for MRSA infections. Performed at [facility], [place] [address], [place], [place] [address]     Radiology Studies: Ct Chest W Contrast  Result Date: 10/23/2018 CLINICAL DATA:  Increased shortness of breath history of pneumonia EXAM: CT CHEST WITH CONTRAST TECHNIQUE: Multidetector CT imaging of the chest was performed during intravenous contrast administration. CONTRAST:  88mL OMNIPAQUE IOHEXOL 300 MG/ML  SOLN COMPARISON:  10/23/2018, 10/02/2018, 02/13/2016 radiographs FINDINGS: Cardiovascular: Nonaneurysmal aorta. Mild aortic atherosclerosis.  Soft heart size within normal limits. No pericardial effusion Mediastinum/Nodes: Midline trachea. 2.3 cm heterogeneous right lobe nodule with calcification. No significant mediastinal adenopathy. Esophagus within normal Lungs/Pleura: No focal consolidation or pleural effusion. No pneumothorax. Minimal left lower lobe peribronchial thickening. Upper Abdomen: No acute abnormality. Musculoskeletal: No chest wall abnormality. No acute or significant osseous findings. IMPRESSION: 1. No focal pneumonia 2. Mild left lower lobe peribronchial thickening which could be secondary to airways inflammation/reactive airways 3. 2.3 cm right thyroid nodule which may be evaluated with nonemergent thyroid ultrasound as indicated Aortic Atherosclerosis (ICD10-I70.0). Electronically Signed   By: Donavan Foil M.D.   On: 10/23/2018 23:03   Dg Chest Port 1 View  Result Date: 10/23/2018 CLINICAL DATA:  Shortness of breath. EXAM: PORTABLE CHEST 1  VIEW COMPARISON:  10/02/2018 and prior radiographs FINDINGS: The cardiomediastinal silhouette is unremarkable. Bilateral LOWER lung opacities/airspace disease again noted and may be slightly increased on the RIGHT. No definite pleural effusion or pneumothorax. No acute bony abnormalities are present. IMPRESSION: Bilateral LOWER lung opacities/airspace disease, question slightly increased on the RIGHT. These opacities may represent pneumonia and/or atelectasis. Electronically Signed   By: Margarette Canada M.D.   On: 10/23/2018 20:43   Scheduled Meds: . enoxaparin (LOVENOX) injection  60 mg Subcutaneous Q24H  . escitalopram  20 mg Oral Daily  . guaiFENesin  1,200 mg Oral BID  . ipratropium-albuterol  3 mL Nebulization Q6H  . methylPREDNISolone (SOLU-MEDROL) injection  60 mg Intravenous Q12H  . QUEtiapine  200 mg Oral QHS  . sodium chloride flush  3 mL Intravenous Q12H   Continuous Infusions: . sodium chloride      LOS: 1 day    Kerney Elbe, DO [facility] PAGER is on  AMION  If 7PM-7AM, please contact night-coverage [email or web address] Password [email or web address] 10/25/2018, 12:41 PM

## 2018-10-26 ENCOUNTER — Inpatient Hospital Stay (HOSPITAL_COMMUNITY): Payer: Medicaid Other

## 2018-10-26 DIAGNOSIS — J440 Chronic obstructive pulmonary disease with acute lower respiratory infection: Secondary | ICD-10-CM

## 2018-10-26 DIAGNOSIS — J069 Acute upper respiratory infection, unspecified: Secondary | ICD-10-CM

## 2018-10-26 LAB — COMPREHENSIVE METABOLIC PANEL
ALBUMIN: 3.3 g/dL — AB (ref 3.5–5.0)
ALT: 12 U/L (ref 0–44)
ANION GAP: 8 (ref 5–15)
AST: 15 U/L (ref 15–41)
Alkaline Phosphatase: 62 U/L (ref 38–126)
BILIRUBIN TOTAL: 0.6 mg/dL (ref 0.3–1.2)
BUN: 32 mg/dL — ABNORMAL HIGH (ref 8–23)
CO2: 33 mmol/L — ABNORMAL HIGH (ref 22–32)
Calcium: 8.6 mg/dL — ABNORMAL LOW (ref 8.9–10.3)
Chloride: 97 mmol/L — ABNORMAL LOW (ref 98–111)
Creatinine, Ser: 0.72 mg/dL (ref 0.44–1.00)
GFR calc Af Amer: >60 mL/min (ref >60)
GFR calc non Af Amer: >60 mL/min (ref >60)
GLUCOSE: 121 mg/dL — AB (ref 70–99)
POTASSIUM: 4.2 mmol/L (ref 3.5–5.1)
SODIUM: 138 mmol/L (ref 135–145)
Total Protein: 7.6 g/dL (ref 6.5–8.1)

## 2018-10-26 LAB — CBC WITH DIFFERENTIAL/PLATELET
Abs Immature Granulocytes: 0.1 10*3/uL — ABNORMAL HIGH (ref 0.00–0.07)
Basophils Absolute: 0 10*3/uL (ref 0.0–0.1)
Basophils Relative: 0 %
EOS PCT: 0 %
Eosinophils Absolute: 0 10*3/uL (ref 0.0–0.5)
HEMATOCRIT: 40.4 % (ref 36.0–46.0)
Hemoglobin: 12.3 g/dL (ref 12.0–15.0)
Immature Granulocytes: 1 %
LYMPHS ABS: 1.3 10*3/uL (ref 0.7–4.0)
Lymphocytes Relative: 9 %
MCH: 30.1 pg (ref 26.0–34.0)
MCHC: 30.4 g/dL (ref 30.0–36.0)
MCV: 99 fL (ref 80.0–100.0)
MONOS PCT: 6 %
Monocytes Absolute: 0.9 10*3/uL (ref 0.1–1.0)
Neutro Abs: 12.4 10*3/uL — ABNORMAL HIGH (ref 1.7–7.7)
Neutrophils Relative %: 84 %
PLATELETS: 275 10*3/uL (ref 150–400)
RBC: 4.08 MIL/uL (ref 3.87–5.11)
RDW: 13.5 % (ref 11.5–15.5)
WBC: 14.7 10*3/uL — ABNORMAL HIGH (ref 4.0–10.5)
nRBC: 0 % (ref 0.0–0.2)

## 2018-10-26 LAB — HEMOGLOBIN A1C
HEMOGLOBIN A1C: 5.7 % — AB (ref 4.8–5.6)
MEAN PLASMA GLUCOSE: 116.89 mg/dL

## 2018-10-26 LAB — MAGNESIUM: Magnesium: 2.4 mg/dL (ref 1.7–2.4)

## 2018-10-26 LAB — PHOSPHORUS: Phosphorus: 3.7 mg/dL (ref 2.5–4.6)

## 2018-10-26 MED ORDER — IPRATROPIUM-ALBUTEROL 0.5-2.5 (3) MG/3ML IN SOLN: 3.0000 mL | Freq: Four times a day (QID) | RESPIRATORY_TRACT | 0 refills | Status: AC | PRN

## 2018-10-26 MED ORDER — PREDNISONE 10 MG (21) PO TBPK: ORAL_TABLET | ORAL | 0 refills | Status: DC

## 2018-10-26 MED ORDER — AMOXICILLIN-POT CLAVULANATE 875-125 MG PO TABS: 1.0000 | ORAL_TABLET | Freq: Two times a day (BID) | ORAL | 0 refills | Status: AC

## 2018-10-26 MED ORDER — METHYLPREDNISOLONE SODIUM SUCC 125 MG IJ SOLR
60.0000 mg | Freq: Every day | INTRAMUSCULAR | Status: DC
  Administered 2018-10-26: 60 mg via INTRAVENOUS
  Filled 2018-10-26: qty 2

## 2018-10-26 MED ORDER — GUAIFENESIN ER 600 MG PO TB12: 600.0000 mg | ORAL_TABLET | Freq: Two times a day (BID) | ORAL | 0 refills | Status: AC

## 2018-10-26 NOTE — Care Management Note (Signed)
Case Management Note  Patient Details  Name: Diane Sutton MRN: [id number] Date of Birth: 1956/12/02  Subjective/Objective:Spoke to patient about d/c plans. From [facility] house(shelter) will return. Has brother to transport back, has pharmacy, & pcp. No further CM needs.                    Action/Plan:d/c to shelter   Expected Discharge Date:  (unknown)               Expected Discharge Plan:  Homeless Shelter  In-House Referral:  Clinical Social Work  Discharge planning Services  CM Consult  Post Acute Care Choice:    Choice offered to:     DME Arranged:    DME Agency:     HH Arranged:    [facility] Agency:     Status of Service:  Completed, signed off  If discussed at [employer], dates discussed:    Additional Comments:  Dessa Phi, RN 10/26/2018, 11:20 AM

## 2018-10-26 NOTE — Progress Notes (Signed)
CSW consulted due to patient being from shelter. Per CM, patient plans to d/c back to [facility] with her brother providing transport. No other CSW needs identified.   Pricilla Holm, MSW, [place] [facility] [phone]

## 2018-10-26 NOTE — Discharge Summary (Signed)
Physician Discharge Summary  Diane Sutton XHB:[id number] DOB: 09-28-1956 DOA: 10/23/2018  PCP: Diane Esters, NP  Admit date: 10/23/2018 Discharge date: 10/26/2018  Admitted From: Home Disposition: Home  Recommendations for Outpatient Follow-up:  1. Follow up with PCP in 1-2 weeks 2. Repeat CXR in 3-6 weeks 3. Please obtain CMP/CBC, Mag, Phos in one week 4. Please follow up on the following pending results:  Home Health: No Equipment/Devices: Nebulizer    Discharge Condition: Stable  CODE STATUS: FULL CODE Diet recommendation: Heart Healthy Diet  Brief/Interim Summary: [name] is a 39 year oldmorbidly obese African-American female with a past medical history significant for depression, hypertension, paranoid schizophrenia, history of HIV not on any current medications along with other comorbidities who presents with a chief complaint of shortness of breath. Patient was seen in the ED on 10/02/2018 with a fever productive cough and dyspnea and was diagnosed with a pneumonia and was given Levaquin. Repeat chest x-ray was recommended for 3 to 4 weeks after to ensure resolution. Patient symptoms have resolved since she completed a course of antibiotics with Levofloxacin however she had sudden onset dyspnea yesterday with a complaint of productive cough with clear phlegm.   CT of the chest was done which showed no pneumonia but did show peribronchial thickening to the airway. Because of her hypoxia and hypercarbia she was placed on BiPAP and was subsequently weaned off 4 L and has now on Room AIr. She was started on IV Solu-Medrol and that was weaned and changed to a po Prednisone Taper for D/C.  Patient significantly improved and was no longer hypercarbic or hypoxic and did not require any oxygen.  She was treated for acute bronchitis/COPD exacerbation with nebs, steroids and antibiotics and she will be discharged on a home antibiotic regimen for 5 days total and p.o.  prednisone taper.  She is also been given a home nebulizer and nebs for discharge.  She is deemed medically stable to be discharged and follow-up with PCP within 1 week.  Discharge Diagnoses:  Active Problems:   Acute respiratory failure (HCC)  Acute hypercarbic respiratory failure requiring noninvasive positive pressure ventilation with BiPAP initially and now on nasal cannula likely in the setting of acute bronchitis/COPD Exacebation, improved  -CT scan of the chest with contrast on 10/23/2018 showed no focal pneumonia, there is mild left lower lobe peribronchial thickening which could be secondary to airway inflammation or reactive airways -ABG on admission showed 7.327, 58.4, 97.9, 29.7, and 98.6% on 8 L of oxygen -Patient was on BiPAP and then weaned from BiPAP to 4 L and is now down on Room Air -Patient was started on IV Solu-Medrol 60 mg every 6 now this is been weaned to 60 mg every 12 and will likely wean further and send patient home with p.o. prednisone taper with Sterapred today  -Started flutter valve, incentive spirometer -Continue with duo nebs 3 mL's every 6 as scheduled and have written for PRN at D/C with Home Nebulizer -Started patient on Augmentin 1 tab p.o. every 12 in case she has an underlying upper airway infection and that she was just recently hospitalized with a pneumonia and will continue for 5 days -Chest x-ray on 10/23/2018 showed bilateral lung opacities/airspace disease with questionable slightly increased on the right.  These opacities may represent pneumonia and/or atelectasis -Repeat CXR this AM (10/26/18) showed "The cardiac silhouette remains near the upper limit of normal in size. Clear lungs. Mild peribronchial thickening without significant change. Mild thoracic spine degenerative changes. The previously demonstrated  elevation of the left hemidiaphragm is no longer demonstrated." -Continue with guaifenesin 1200 g p.o. twice daily and will change to 600 mg po  BID at D/C  -Continuous pulse oximetry and maintain O2 saturations greater than 90% -Continue with supplemental oxygen via nasal cannula and wean O2 as tolerated  -Home Ambulatory Screen done and patient did not desaturate -Will D/C with Home Nebulizer -Follow up with PCP within 1 week and repeat CXR in 3-6 weeks    Mild Lower Extremity edema, improved  -Given patient 1 dose of IV Lasix 40 mg yesterday -Currently her home hydrochlorothiazide is being held but ok to resume at D/C  Paranoid Schizophrenia -Continue with Quetiapine 200 mg p.o. nightly along with escitalopram 20 mg p.o. daily  Hypertension -Continued to hold home Hydrochlorothiazide while hospitalized but resumed at D/C -Continue with hydralazine 10 mg IV every 4 PRN for systolic blood pressure greater than 329 or diastolic blood pressure greater than 100 while hospitalized  Hyperglycemia in the setting of prediabetes/impaired fasting glucose -Blood Sugars have been ranging from 113-161 on BMP/CMP -Check HbA1c as last hemoglobin A1c was 5.7 -Blood sugar slightly worsened in the setting of IV steroid demargination however she was hyperglycemic before steroids were even initiated -Continue monitor blood sugars and if continue to be elevated will place on sensitive NovoLog sliding scale insulin AC -Follow up with PCP at D/C   Morbid Obesity -Estimated body mass index is 48.22 kg/m as calculated from the following:   Height as of this encounter: 5\' 6"  (1.676 m).   Weight as of this encounter: 135.5 kg. -Weight Loss Counseling given    Hx of HIV ?; Ruled Out HIV -Has a history of positive non-active HIV -HIV screen here showed as nonreactive -CD4 count was 390 is slightly low -HIV RNA quant is <20  Discharge Instructions  Discharge Instructions    Call MD for:  difficulty breathing, headache or visual disturbances   Complete by:  As directed    Call MD for:  extreme fatigue   Complete by:  As directed    Call  MD for:  hives   Complete by:  As directed    Call MD for:  persistant dizziness or light-headedness   Complete by:  As directed    Call MD for:  persistant nausea and vomiting   Complete by:  As directed    Call MD for:  redness, tenderness, or signs of infection (pain, swelling, redness, odor or green/yellow discharge around incision site)   Complete by:  As directed    Call MD for:  severe uncontrolled pain   Complete by:  As directed    Call MD for:  temperature >100.4   Complete by:  As directed    DME Nebulizer/meds   Complete by:  As directed    Patient needs a nebulizer to treat with the following condition:  SOB (shortness of breath)   Diet - low sodium heart healthy   Complete by:  As directed    Discharge instructions   Complete by:  As directed    You were cared for by a hospitalist during your hospital stay. If you have any questions about your discharge medications or the care you received while you were in the hospital after you are discharged, you can call the unit and ask to speak with the hospitalist on call if the hospitalist that took care of you is not available. Once you are discharged, your primary care physician will handle any  further medical issues. Please note that NO REFILLS for any discharge medications will be authorized once you are discharged, as it is imperative that you return to your primary care physician (or establish a relationship with a primary care physician if you do not have one) for your aftercare needs so that they can reassess your need for medications and monitor your lab values.  Follow up with PCP and Repeat CXR in 3-6 weeks. Take all medications as prescribed. If symptoms change or worsen please return to the ED for evaluation   Increase activity slowly   Complete by:  As directed      Allergies as of 10/26/2018   No Known Allergies     Medication List    STOP taking these medications   levofloxacin 500 MG tablet Commonly known as:   LEVAQUIN   meloxicam 15 MG tablet Commonly known as:  MOBIC     TAKE these medications   amoxicillin-clavulanate 875-125 MG tablet Commonly known as:  AUGMENTIN Take 1 tablet by mouth every 12 (twelve) hours for 4 days.   escitalopram 20 MG tablet Commonly known as:  LEXAPRO Take 1 tablet (20 mg total) by mouth daily for 14 days.   guaiFENesin 600 MG 12 hr tablet Commonly known as:  MUCINEX Take 1 tablet (600 mg total) by mouth 2 (two) times daily for 4 days.   hydrochlorothiazide 12.5 MG capsule Commonly known as:  MICROZIDE Take 12.5 mg by mouth daily.   ibuprofen 200 MG tablet Commonly known as:  ADVIL,MOTRIN Take 200 mg by mouth every 6 (six) hours as needed for moderate pain.   ipratropium-albuterol 0.5-2.5 (3) MG/3ML Soln Commonly known as:  DUONEB Take 3 mLs by nebulization every 6 (six) hours as needed.   nystatin cream Commonly known as:  MYCOSTATIN Apply 1 application topically 2 (two) times daily. Apply to affected area twice daily for 1 week What changed:  additional instructions   predniSONE 10 MG (21) Tbpk tablet Commonly known as:  STERAPRED UNI-PAK 21 TAB Take 6 pills on day 1, 5 pills on day 2, 4 pills on day 3, 3 pills on day 4, 2 pills on day 5, 1 pill on day 6 and stop on day 7   PROAIR HFA 108 (90 Base) MCG/ACT inhaler Generic drug:  albuterol Take 1 puff by mouth every 6 (six) hours as needed for shortness of breath.   QUEtiapine 200 MG tablet Commonly known as:  SEROQUEL Take 1 tablet (200 mg total) by mouth at bedtime.            Durable Medical Equipment  (From admission, onward)         Start     Ordered   10/26/18 0000  DME Nebulizer/meds    Question:  Patient needs a nebulizer to treat with the following condition  Answer:  SOB (shortness of breath)   10/26/18 1207         Follow-up Information    Diane Esters, NP. Call.   Specialty:  Family Medicine Why:  Follow up within 1 week Contact information: [place] [address] [phone]          No Known Allergies  Consultations:  None  Procedures/Studies: Dg Chest 2 View  Result Date: 10/02/2018 CLINICAL DATA:  Right-sided shortness of breath and chest pain since yesterday. History of hypertension, HIV, nonsmoker. EXAM: CHEST - 2 VIEW COMPARISON:  Chest x-ray of February 13, 2016 FINDINGS: The lungs are well-expanded. The  interstitial markings are increased in the right lower lobe. The heart and pulmonary vascularity are normal. The mediastinum is normal in width. The trachea is midline. There is calcification in the wall of the aortic arch. The bony thorax is unremarkable. IMPRESSION: Right lower lobe atelectasis or pneumonia. Followup PA and lateral chest X-ray is recommended in 3-4 weeks following trial of antibiotic therapy to ensure resolution and exclude underlying malignancy. Thoracic aortic atherosclerosis. Electronically Signed   By: David  Martinique M.D.   On: 10/02/2018 15:53   Ct Chest W Contrast  Result Date: 10/23/2018 CLINICAL DATA:  Increased shortness of breath history of pneumonia EXAM: CT CHEST WITH CONTRAST TECHNIQUE: Multidetector CT imaging of the chest was performed during intravenous contrast administration. CONTRAST:  40mL OMNIPAQUE IOHEXOL 300 MG/ML  SOLN COMPARISON:  10/23/2018, 10/02/2018, 02/13/2016 radiographs FINDINGS: Cardiovascular: Nonaneurysmal aorta. Mild aortic atherosclerosis. Soft heart size within normal limits. No pericardial effusion Mediastinum/Nodes: Midline trachea. 2.3 cm heterogeneous right lobe nodule with calcification. No significant mediastinal adenopathy. Esophagus within normal Lungs/Pleura: No focal consolidation or pleural effusion. No pneumothorax. Minimal left lower lobe peribronchial thickening. Upper Abdomen: No acute abnormality. Musculoskeletal: No chest wall abnormality. No acute or significant osseous findings. IMPRESSION: 1. No focal pneumonia 2. Mild left lower lobe  peribronchial thickening which could be secondary to airways inflammation/reactive airways 3. 2.3 cm right thyroid nodule which may be evaluated with nonemergent thyroid ultrasound as indicated Aortic Atherosclerosis (ICD10-I70.0). Electronically Signed   By: Donavan Foil M.D.   On: 10/23/2018 23:03   Dg Chest Port 1 View  Result Date: 10/26/2018 CLINICAL DATA:  Shortness of breath. EXAM: PORTABLE CHEST 1 VIEW COMPARISON:  10/25/2018 FINDINGS: The cardiac silhouette remains near the upper limit of normal in size. Clear lungs. Mild peribronchial thickening without significant change. Mild thoracic spine degenerative changes. The previously demonstrated elevation of the left hemidiaphragm is no longer demonstrated. IMPRESSION: Stable mild chronic changes. No acute abnormality. Electronically Signed   By: Claudie Revering M.D.   On: 10/26/2018 08:31   Dg Chest Port 1 View  Result Date: 10/25/2018 CLINICAL DATA:  Shortness of breath with exertion. EXAM: PORTABLE CHEST 1 VIEW COMPARISON:  Portable chest dated 10/23/2018 and chest CT dated 10/23/2018. FINDINGS: Normal sized heart. Interval linear density at the left lung base. The remainder of the lungs are clear. Mild peribronchial thickening. Thoracic spine degenerative changes. Mild elevation of the left hemidiaphragm with mild progression. IMPRESSION: 1. Interval linear atelectasis at the left lung base. 2. Mild bronchitic changes. Electronically Signed   By: Claudie Revering M.D.   On: 10/25/2018 13:26   Dg Chest Port 1 View  Result Date: 10/23/2018 CLINICAL DATA:  Shortness of breath. EXAM: PORTABLE CHEST 1 VIEW COMPARISON:  10/02/2018 and prior radiographs FINDINGS: The cardiomediastinal silhouette is unremarkable. Bilateral LOWER lung opacities/airspace disease again noted and may be slightly increased on the RIGHT. No definite pleural effusion or pneumothorax. No acute bony abnormalities are present. IMPRESSION: Bilateral LOWER lung opacities/airspace  disease, question slightly increased on the RIGHT. These opacities may represent pneumonia and/or atelectasis. Electronically Signed   By: Margarette Canada M.D.   On: 10/23/2018 20:43   Mm 3d Screen Breast Bilateral  Result Date: 10/20/2018 CLINICAL DATA:  Screening. EXAM: DIGITAL SCREENING BILATERAL MAMMOGRAM WITH TOMO AND CAD COMPARISON:  Previous exam(s). ACR Breast Density Category b: There are scattered areas of fibroglandular density. FINDINGS: There are no findings suspicious for malignancy. Images were processed with CAD. IMPRESSION: No mammographic evidence of malignancy. A result letter  of this screening mammogram will be mailed directly to the patient. RECOMMENDATION: Screening mammogram in one year. (Code:[id number]-B-01Y) BI-RADS CATEGORY  1: Negative. Electronically Signed   By: Lillia Mountain M.D.   On: 10/20/2018 10:36     Subjective: Seen and examined at bedside and was doing well.  Respiratory status had significantly improved and she was not wearing any supplemental oxygen via nasal cannula.  Denies chest pain, lightheadedness or dizziness.  No other concerns please at this time and ready to go home.  Discharge Exam: Vitals:   10/26/18 0813 10/26/18 0815  BP:    Pulse:    Resp:    Temp:    SpO2: 95% 95%   Vitals:   10/25/18 2041 10/26/18 0551 10/26/18 0813 10/26/18 0815  BP: (!) 144/79 121/78    Pulse: 82 76    Resp: 20 18    Temp: 98.4 F (36.9 C) 98.6 F (37 C)    TempSrc: Oral Oral    SpO2: 98% 96% 95% 95%  Weight:      Height:       General: Pt is alert, awake, not in acute distress; morbidly obese African-American female who is pleasant to speak with Cardiovascular: RRR, S1/S2 +, no rubs, no gallops Respiratory: Diminished bilaterally, no wheezing, no rhonchi; not wearing any supplemental oxygen or using accessory muscles to breathe as she has unlabored breathing Abdominal: Soft, NT, ND, bowel sounds + Extremities: Trace edema, no cyanosis  The results of significant  diagnostics from this hospitalization (including imaging, microbiology, ancillary and laboratory) are listed below for reference.    Microbiology: Recent Results (from the past 240 hour(s))  MRSA PCR Screening     Status: None   Collection Time: 10/24/18  6:25 AM  Result Value Ref Range Status   MRSA by PCR NEGATIVE NEGATIVE Final    Comment:        The GeneXpert MRSA Assay (FDA approved for NASAL specimens only), is one component of a comprehensive MRSA colonization surveillance program. It is not intended to diagnose MRSA infection nor to guide or monitor treatment for MRSA infections. Performed at [facility], [place] [address], [place], [place] [address]     Labs: BNP (last 3 results) Recent Labs    10/23/18 1959  BNP 60.4   Basic Metabolic Panel: Recent Labs  Lab 10/23/18 1924 10/24/18 0312 10/26/18 0434  NA 139 137 138  K 3.8 4.0 4.2  CL 100 99 97*  CO2 33* 29 33*  GLUCOSE 117* 161* 121*  BUN 19 18 32*  CREATININE 0.75 0.70 0.72  CALCIUM 8.7* 8.6* 8.6*  MG  --  2.0 2.4  PHOS  --   --  3.7   Liver Function Tests: Recent Labs  Lab 10/23/18 1924 10/24/18 0312 10/26/18 0434  AST 20 17 15   ALT 15 16 12   ALKPHOS 80 73 62  BILITOT 0.2* 0.4 0.6  PROT 8.3* 8.3* 7.6  ALBUMIN 3.8 3.6 3.3*   No results for input(s): LIPASE, AMYLASE in the last 168 hours. No results for input(s): AMMONIA in the last 168 hours. CBC: Recent Labs  Lab 10/23/18 1924 10/24/18 0312 10/26/18 0434  WBC 6.9 6.8 14.7*  NEUTROABS 3.8  --  12.4*  HGB 12.6 12.1 12.3  HCT 41.7 40.5 40.4  MCV 99.8 100.5* 99.0  PLT 276 245 275   Cardiac Enzymes: No results for input(s): CKTOTAL, CKMB, CKMBINDEX, TROPONINI in the last 168 hours. BNP: Invalid input(s): POCBNP CBG: No results  for input(s): GLUCAP in the last 168 hours. D-Dimer No results for input(s): DDIMER in the last 72 hours. Hgb A1c Recent Labs    10/26/18 0434  HGBA1C 5.7*   Lipid Profile No results  for input(s): CHOL, HDL, LDLCALC, TRIG, CHOLHDL, LDLDIRECT in the last 72 hours. Thyroid function studies No results for input(s): TSH, T4TOTAL, T3FREE, THYROIDAB in the last 72 hours.  Invalid input(s): FREET3 Anemia work up No results for input(s): VITAMINB12, FOLATE, FERRITIN, TIBC, IRON, RETICCTPCT in the last 72 hours. Urinalysis    Component Value Date/Time   COLORURINE lt. yellow 05/02/2010 1111   APPEARANCEUR Clear 05/02/2010 1111   LABSPEC >=1.030 05/02/2010 1111   PHURINE 6.0 05/02/2010 1111   GLUCOSEU NEGATIVE 03/05/2010 1516   HGBUR negative 05/02/2010 1111   BILIRUBINUR negative 05/02/2010 1111   KETONESUR NEGATIVE 03/05/2010 1516   PROTEINUR NEGATIVE 03/05/2010 1516   UROBILINOGEN 0.2 05/02/2010 1111   NITRITE positive 05/02/2010 1111   LEUKOCYTESUR NEGATIVE 03/05/2010 1516   Sepsis Labs Invalid input(s): PROCALCITONIN,  WBC,  LACTICIDVEN Microbiology Recent Results (from the past 240 hour(s))  MRSA PCR Screening     Status: None   Collection Time: 10/24/18  6:25 AM  Result Value Ref Range Status   MRSA by PCR NEGATIVE NEGATIVE Final    Comment:        The GeneXpert MRSA Assay (FDA approved for NASAL specimens only), is one component of a comprehensive MRSA colonization surveillance program. It is not intended to diagnose MRSA infection nor to guide or monitor treatment for MRSA infections. Performed at [facility], [place] [address] [place], [place] [address]    Time coordinating discharge: 35 minutes  SIGNED:  Kerney Elbe, DO [facility] 10/26/2018, 12:10 PM Pager is on [place]  If 7PM-7AM, please contact night-coverage [email or web address] Password [email or web address]

## 2018-10-26 NOTE — Plan of Care (Signed)
Discharge instructions reviewed with patient, questions answered, verbalized understanding.  Patient awaiting arrival of nebulizer machine.

## 2018-10-26 NOTE — Care Management Note (Signed)
Case Management Note  Patient Details  Name: Diane Sutton MRN: [id number] Date of Birth: [birth year]  Subjective/Objective:  Received call from nsg-patient ordered for nebulizer soln, & needs a nebulizer machine-neb machine ordered.[facility] dme rep Jermaine-will deliver neb machine to rm prior d/c. No further CM needs.                  Action/Plan:d/c homeless shelter/dme.   Expected Discharge Date:  10/26/18               Expected Discharge Plan:  Homeless Shelter  In-House Referral:  Clinical Social Work  Discharge planning Services  CM Consult  Post Acute Care Choice:    Choice offered to:     DME Arranged:  [facility] DME Agency:  [place]:    [place] Agency:     Status of Service:  Completed, signed off  If discussed at [employer], dates discussed:    Additional Comments:  Dessa Phi, RN 10/26/2018, 1:24 PM

## 2018-11-02 ENCOUNTER — Encounter (HOSPITAL_COMMUNITY): Payer: Self-pay | Admitting: Oncology

## 2018-11-02 ENCOUNTER — Other Ambulatory Visit: Payer: Self-pay

## 2018-11-02 ENCOUNTER — Emergency Department (HOSPITAL_COMMUNITY): Payer: Medicaid Other

## 2018-11-02 ENCOUNTER — Emergency Department (HOSPITAL_COMMUNITY)
Admission: EM | Admit: 2018-11-02 | Discharge: 2018-11-02 | Disposition: A | Discharge status: Home / Self Care | Payer: Medicaid Other | Attending: Emergency Medicine | Admitting: Emergency Medicine

## 2018-11-02 DIAGNOSIS — Z8509 Personal history of malignant neoplasm of other digestive organs: Secondary | ICD-10-CM | POA: Insufficient documentation

## 2018-11-02 DIAGNOSIS — B20 Human immunodeficiency virus [HIV] disease: Secondary | ICD-10-CM | POA: Diagnosis not present

## 2018-11-02 DIAGNOSIS — R1084 Generalized abdominal pain: Secondary | ICD-10-CM | POA: Diagnosis present

## 2018-11-02 DIAGNOSIS — R110 Nausea: Secondary | ICD-10-CM | POA: Insufficient documentation

## 2018-11-02 DIAGNOSIS — F209 Schizophrenia, unspecified: Secondary | ICD-10-CM | POA: Diagnosis not present

## 2018-11-02 DIAGNOSIS — I10 Essential (primary) hypertension: Secondary | ICD-10-CM | POA: Insufficient documentation

## 2018-11-02 HISTORY — DX: Malignant (primary) neoplasm, unspecified: C80.1

## 2018-11-02 LAB — COMPREHENSIVE METABOLIC PANEL
ALT: 24 U/L (ref 0–44)
AST: 15 U/L (ref 15–41)
Albumin: 3.3 g/dL — ABNORMAL LOW (ref 3.5–5.0)
Alkaline Phosphatase: 70 U/L (ref 38–126)
Anion gap: 8 (ref 5–15)
BILIRUBIN TOTAL: 0.5 mg/dL (ref 0.3–1.2)
BUN: 29 mg/dL — AB (ref 8–23)
CHLORIDE: 103 mmol/L (ref 98–111)
CO2: 27 mmol/L (ref 22–32)
Calcium: 8.3 mg/dL — ABNORMAL LOW (ref 8.9–10.3)
Creatinine, Ser: 0.85 mg/dL (ref 0.44–1.00)
Glucose, Bld: 121 mg/dL — ABNORMAL HIGH (ref 70–99)
POTASSIUM: 3.9 mmol/L (ref 3.5–5.1)
Sodium: 138 mmol/L (ref 135–145)
TOTAL PROTEIN: 7.2 g/dL (ref 6.5–8.1)

## 2018-11-02 LAB — CBC WITH DIFFERENTIAL/PLATELET
Abs Immature Granulocytes: 0.15 10*3/uL — ABNORMAL HIGH (ref 0.00–0.07)
Basophils Absolute: 0 10*3/uL (ref 0.0–0.1)
Basophils Relative: 0 %
Eosinophils Absolute: 0.2 10*3/uL (ref 0.0–0.5)
Eosinophils Relative: 2 %
HEMATOCRIT: 38.1 % (ref 36.0–46.0)
Hemoglobin: 11.6 g/dL — ABNORMAL LOW (ref 12.0–15.0)
Immature Granulocytes: 1 %
Lymphocytes Relative: 33 %
Lymphs Abs: 4.2 10*3/uL — ABNORMAL HIGH (ref 0.7–4.0)
MCH: 30.2 pg (ref 26.0–34.0)
MCHC: 30.4 g/dL (ref 30.0–36.0)
MCV: 99.2 fL (ref 80.0–100.0)
MONO ABS: 1 10*3/uL (ref 0.1–1.0)
MONOS PCT: 8 %
NEUTROS ABS: 7.2 10*3/uL (ref 1.7–7.7)
Neutrophils Relative %: 56 %
PLATELETS: 259 10*3/uL (ref 150–400)
RBC: 3.84 MIL/uL — AB (ref 3.87–5.11)
RDW: 14 % (ref 11.5–15.5)
WBC: 12.7 10*3/uL — AB (ref 4.0–10.5)
nRBC: 0 % (ref 0.0–0.2)

## 2018-11-02 LAB — URINALYSIS, ROUTINE W REFLEX MICROSCOPIC
Bilirubin Urine: NEGATIVE
GLUCOSE, UA: NEGATIVE mg/dL
HGB URINE DIPSTICK: NEGATIVE
KETONES UR: NEGATIVE mg/dL
LEUKOCYTES UA: NEGATIVE
Nitrite: NEGATIVE
PROTEIN: NEGATIVE mg/dL
Specific Gravity, Urine: >1.046 — ABNORMAL HIGH (ref 1.005–1.030)
pH: 5 (ref 5.0–8.0)

## 2018-11-02 LAB — LIPASE, BLOOD: LIPASE: 29 U/L (ref 11–51)

## 2018-11-02 MED ORDER — ONDANSETRON HCL 4 MG/2ML IJ SOLN
4.0000 mg | Freq: Once | INTRAMUSCULAR | Status: AC
  Administered 2018-11-02: 4 mg via INTRAVENOUS
  Filled 2018-11-02: qty 2

## 2018-11-02 MED ORDER — DICYCLOMINE HCL 20 MG PO TABS: 20.0000 mg | ORAL_TABLET | Freq: Three times a day (TID) | ORAL | 0 refills | Status: DC

## 2018-11-02 MED ORDER — IOPAMIDOL (ISOVUE-300) INJECTION 61%
INTRAVENOUS | Status: AC
  Filled 2018-11-02: qty 100

## 2018-11-02 MED ORDER — SODIUM CHLORIDE 0.9 % IJ SOLN
INTRAMUSCULAR | Status: AC
  Filled 2018-11-02: qty 50

## 2018-11-02 MED ORDER — PANTOPRAZOLE SODIUM 40 MG PO TBEC: 40.0000 mg | DELAYED_RELEASE_TABLET | Freq: Every day | ORAL | 0 refills | Status: DC

## 2018-11-02 MED ORDER — IOPAMIDOL (ISOVUE-300) INJECTION 61%
100.0000 mL | Freq: Once | INTRAVENOUS | Status: AC | PRN
  Administered 2018-11-02: 100 mL via INTRAVENOUS

## 2018-11-02 MED ORDER — SODIUM CHLORIDE 0.9 % IV BOLUS
500.0000 mL | Freq: Once | INTRAVENOUS | Status: AC
  Administered 2018-11-02: 500 mL via INTRAVENOUS

## 2018-11-02 NOTE — ED Triage Notes (Signed)
Pt bib [facility] d/t diffuse abdominal pain.  Per EMS pt has hx of gastric CA.  Pt ambulatory to room.

## 2018-11-02 NOTE — ED Provider Notes (Signed)
[place] DEPT Provider Note   CSN: [id number] Arrival date & time: 11/02/18  0057     History   Chief Complaint Chief Complaint  Patient presents with  . Abdominal Pain    HPI Diane Sutton is a 62 y.o. female.  Patient presents to the ER for evaluation of abdominal pain that has been going on for several hours.  Patient reports diffuse cramping pain associated with nausea but no vomiting.  She has not had diarrhea or constipation, has been having normal bowel movements.  No melena or hematochezia.  Patient denies urinary symptoms.  Pain does, however, going to her lower back.  She has not had any fever.  No chest pain or shortness of breath.     Past Medical History:  Diagnosis Date  . Depression   . gastric ca   . HIV (human immunodeficiency virus infection) ([place])    positive-non active  . Hypertension   . Schizophrenia [facility])     Patient Active Problem List   Diagnosis Date Noted  . Acute respiratory failure ([place]) 10/24/2018  . HIV (human immunodeficiency virus infection) ([place])   . Paranoid schizophrenia ([place]) 02/05/2011  . SKIN RASH 10/04/2010  . ALLERGIC RHINITIS 05/02/2010  . CANDIDIASIS 04/05/2010  . THYROID NODULE, RIGHT 10/07/2009  . PNEUMONIA 09/23/2009  . CERVICAL RADICULOPATHY, LEFT 09/12/2009  . UTI 08/10/2009  . CHEST PAIN UNSPECIFIED 08/10/2009  . 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 INGROWING NAIL 10/05/2008  . PRURITUS, EARS 06/08/2008  . OTALGIA 06/02/2008  . VAGINITIS, BACTERIAL 03/31/2008  . ARTHRITIS, RIGHT KNEE 03/23/2008  . OTHER ABNORMAL BLOOD CHEMISTRY 01/29/2008  . GOITER, MULTINODULAR 08/29/2007  . HYPERTHYROIDISM 08/29/2007  . DEPRESSION 08/29/2007  . MIGRAINE, CHRONIC 08/29/2007  . STRABISMUS 08/29/2007  . AUDITORY HALLUCINATION 08/29/2007    Past Surgical History:  Procedure Laterality Date  . ABDOMINAL HYSTERECTOMY    . ANKLE SURGERY  2004   orif left  . FINGER SURGERY  2004   i/d lt  middle finger  . STRABISMUS SURGERY Left 12/11/2007  . STRABISMUS SURGERY Bilateral 01/22/2014   Procedure: REPAIR STRABISMUS BILATERAL EYES;  Surgeon: Derry Skill, MD;  Location: [facility];  Service: Ophthalmology;  Laterality: Bilateral;  . THYROID SURGERY     needle tx     OB History   None      Home Medications    Prior to Admission medications   Medication Sig Start Date End Date Taking? Authorizing Provider  escitalopram (LEXAPRO) 20 MG tablet Take 1 tablet (20 mg total) by mouth daily for 14 days. 05/17/18 11/02/18 Yes Patrecia Pour, NP  hydrochlorothiazide (MICROZIDE) 12.5 MG capsule Take 12.5 mg by mouth daily.  04/04/18  Yes [provider]  ibuprofen (ADVIL,MOTRIN) 200 MG tablet Take 200 mg by mouth every 6 (six) hours as needed for moderate pain.   Yes [provider]  ipratropium-albuterol (DUONEB) 0.5-2.5 (3) MG/3ML SOLN Take 3 mLs by nebulization every 6 (six) hours as needed. Patient taking differently: Take 3 mLs by nebulization every 6 (six) hours as needed (wheezing).  10/26/18  Yes Sheikh, Omair Latif, DO  meloxicam (MOBIC) 15 MG tablet Take 15 mg by mouth daily.   Yes [provider]  nystatin cream (MYCOSTATIN) Apply 1 application topically 2 (two) times daily. Apply to affected area twice daily for 1 week Patient taking differently: Apply 1 application topically 2 (two) times daily.  05/04/18  Yes Jacqlyn Larsen PA-C  PROAIR HFA [phone]  Base) MCG/ACT inhaler Take 1 puff by mouth every 6 (six) hours as needed for shortness of breath. 10/08/18  Yes [provider]  QUEtiapine (SEROQUEL) 200 MG tablet Take 1 tablet (200 mg total) by mouth at bedtime. 05/17/18  Yes Lord, Asa Saunas, NP  dicyclomine (BENTYL) 20 MG tablet Take 1 tablet (20 mg total) by mouth 3 (three) times daily before meals. 11/02/18   Orpah Greek, MD  pantoprazole (PROTONIX) 40 MG tablet Take 1 tablet (40 mg total) by mouth daily. 11/02/18    Orpah Greek, MD  predniSONE (STERAPRED UNI-PAK 21 TAB) 10 MG (21) TBPK tablet Take 6 pills on day 1, 5 pills on day 2, 4 pills on day 3, 3 pills on day 4, 2 pills on day 5, 1 pill on day 6 and stop on day 7 Patient not taking: Reported on 11/02/2018 10/26/18   Kerney Elbe, DO    Family History Family History  Problem Relation Age of Onset  . Heart disease Mother   . Colon cancer Neg Hx     Social History Social History   Tobacco Use  . Smoking status: Never Smoker  . Smokeless tobacco: Former Network engineer Use Topics  . Alcohol use: No  . Drug use: No     Allergies   Patient has no known allergies.   Review of Systems Review of Systems  Gastrointestinal: Positive for abdominal pain and nausea.  All other systems reviewed and are negative.    Physical Exam Updated Vital Signs BP 111/60   Pulse 79   Temp 97.6 F (36.4 C) (Oral)   Resp 18   Ht 5\' 6"  (1.676 m)   Wt 131.5 kg   SpO2 96%   BMI 46.81 kg/m   Physical Exam  Constitutional: She is oriented to person, place, and time. She appears well-developed and well-nourished. No distress.  HENT:  Head: Normocephalic and atraumatic.  Right Ear: Hearing normal.  Left Ear: Hearing normal.  Nose: Nose normal.  Mouth/Throat: Oropharynx is clear and moist and mucous membranes are normal.  Eyes: Pupils are equal, round, and reactive to light. Conjunctivae and EOM are normal.  Neck: Normal range of motion. Neck supple.  Cardiovascular: Regular rhythm, S1 normal and S2 normal. Exam reveals no gallop and no friction rub.  No murmur heard. Pulmonary/Chest: Effort normal and breath sounds normal. No respiratory distress. She exhibits no tenderness.  Abdominal: Soft. Normal appearance and bowel sounds are normal. There is no hepatosplenomegaly. There is generalized tenderness. There is no rebound, no guarding, no tenderness at McBurney's point and negative Murphy's sign. No hernia.  Musculoskeletal:  Normal range of motion.  Neurological: She is alert and oriented to person, place, and time. She has normal strength. No cranial nerve deficit or sensory deficit. Coordination normal. GCS eye subscore is 4. GCS verbal subscore is 5. GCS motor subscore is 6.  Skin: Skin is warm, dry and intact. No rash noted. No cyanosis.  Psychiatric: She has a normal mood and affect. Her speech is normal and behavior is normal. Thought content normal.  Nursing note and vitals reviewed.    ED Treatments / Results  Labs (all labs ordered are listed, but only abnormal results are displayed) Labs Reviewed  CBC WITH DIFFERENTIAL/PLATELET - Abnormal; Notable for the following components:      Result Value   WBC 12.7 (*)    RBC 3.84 (*)    Hemoglobin 11.6 (*)    Lymphs Abs 4.2 (*)  Abs Immature Granulocytes 0.15 (*)    All other components within normal limits  COMPREHENSIVE METABOLIC PANEL - Abnormal; Notable for the following components:   Glucose, Bld 121 (*)    BUN 29 (*)    Calcium 8.3 (*)    Albumin 3.3 (*)    All other components within normal limits  URINALYSIS, ROUTINE W REFLEX MICROSCOPIC - Abnormal; Notable for the following components:   Specific Gravity, Urine >1.046 (*)    All other components within normal limits  LIPASE, BLOOD    EKG None  Radiology Ct Abdomen Pelvis W Contrast  Result Date: 11/02/2018 CLINICAL DATA:  Mid abdominal pain EXAM: CT ABDOMEN AND PELVIS WITH CONTRAST TECHNIQUE: Multidetector CT imaging of the abdomen and pelvis was performed using the standard protocol following bolus administration of intravenous contrast. CONTRAST:  110mL ISOVUE-300 IOPAMIDOL (ISOVUE-300) INJECTION 61% COMPARISON:  None FINDINGS: Lower chest: Scar and/or subsegmental atelectasis within the lingula and posterior left lower lobe. No pleural fluid. Hepatobiliary: No focal liver abnormality is seen. No gallstones, gallbladder wall thickening, or biliary dilatation. Pancreas: Unremarkable. No  pancreatic ductal dilatation or surrounding inflammatory changes. Spleen: Normal in size without focal abnormality. Adrenals/Urinary Tract: Normal adrenal glands. The kidneys are unremarkable. The urinary bladder appears within normal limits. Stomach/Bowel: Stomach is within normal limits. Appendix appears normal. No evidence of bowel wall thickening, distention, or inflammatory changes. Vascular/Lymphatic: No significant vascular findings are present. No enlarged abdominal or pelvic lymph nodes. Reproductive: Status post hysterectomy. No adnexal masses. Other: No abdominal wall hernia or abnormality. No abdominopelvic ascites. Musculoskeletal: Spondylosis noted within the lumbar spine. IMPRESSION: 1. No acute findings within the abdomen or pelvis. Electronically Signed   By: Kerby Moors M.D.   On: 11/02/2018 01:20    Procedures Procedures (including critical care time)  Medications Ordered in ED Medications  iopamidol (ISOVUE-300) 61 % injection (has no administration in time range)  sodium chloride 0.9 % injection (has no administration in time range)  sodium chloride 0.9 % bolus 500 mL (0 mLs Intravenous Stopped 11/02/18 0207)  ondansetron (ZOFRAN) injection 4 mg (4 mg Intravenous Given 11/02/18 0207)  iopamidol (ISOVUE-300) 61 % injection 100 mL (100 mLs Intravenous Contrast Given 11/02/18 0134)     Initial Impression / Assessment and Plan / ED Course  I have reviewed the triage vital signs and the nursing notes.  Pertinent labs & imaging results that were available during my care of the patient were reviewed by me and considered in my medical decision making (see chart for details).     Patient presents to the emergency department for evaluation of abdominal pain.  She reports previous history of gastric cancer, pain today is all over.  No hematemesis, rectal bleeding or melena.  Abdominal examination revealed mild and diffuse tenderness without guarding or rebound.  Vital signs are  normal.  She is afebrile.  Lab work is entirely normal except for white blood cell count of 12.9, nonspecific.  She underwent CT scan that does not show any acute pathology.  This point patient will be provided symptomatic treatment, needs to follow-up with PCP and possibly GI, but does not require any further urgent intervention or evaluation at this time.  Final Clinical Impressions(s) / ED Diagnoses   Final diagnoses:  Generalized abdominal pain    ED Discharge Orders         Ordered    pantoprazole (PROTONIX) 40 MG tablet  Daily     11/02/18 0704    dicyclomine (BENTYL) 20  MG tablet  3 times daily before meals     11/02/18 0704           Orpah Greek, MD 11/02/18 [phone]

## 2018-11-02 NOTE — ED Notes (Signed)
Bed: WA09 Expected date:  Expected time:  Means of arrival:  Comments: EMS 62 yo female abdominal and back pain with hx stomach cancer

## 2018-11-12 ENCOUNTER — Emergency Department (HOSPITAL_COMMUNITY): Payer: Medicaid Other

## 2018-11-12 ENCOUNTER — Inpatient Hospital Stay (HOSPITAL_COMMUNITY)
Admission: EM | Admit: 2018-11-12 | Discharge: 2018-11-14 | DRG: 191 | Disposition: A | Discharge status: Home / Self Care | Payer: Medicaid Other | Attending: Internal Medicine | Admitting: Internal Medicine

## 2018-11-12 ENCOUNTER — Encounter (HOSPITAL_COMMUNITY): Payer: Self-pay | Admitting: *Deleted

## 2018-11-12 ENCOUNTER — Other Ambulatory Visit: Payer: Self-pay

## 2018-11-12 DIAGNOSIS — R0902 Hypoxemia: Secondary | ICD-10-CM | POA: Diagnosis present

## 2018-11-12 DIAGNOSIS — Z85028 Personal history of other malignant neoplasm of stomach: Secondary | ICD-10-CM

## 2018-11-12 DIAGNOSIS — Z21 Asymptomatic human immunodeficiency virus [HIV] infection status: Secondary | ICD-10-CM | POA: Diagnosis present

## 2018-11-12 DIAGNOSIS — F200 Paranoid schizophrenia: Secondary | ICD-10-CM | POA: Diagnosis not present

## 2018-11-12 DIAGNOSIS — Z791 Long term (current) use of non-steroidal anti-inflammatories (NSAID): Secondary | ICD-10-CM

## 2018-11-12 DIAGNOSIS — I10 Essential (primary) hypertension: Secondary | ICD-10-CM | POA: Diagnosis present

## 2018-11-12 DIAGNOSIS — J209 Acute bronchitis, unspecified: Secondary | ICD-10-CM | POA: Diagnosis present

## 2018-11-12 DIAGNOSIS — Z8249 Family history of ischemic heart disease and other diseases of the circulatory system: Secondary | ICD-10-CM

## 2018-11-12 DIAGNOSIS — J441 Chronic obstructive pulmonary disease with (acute) exacerbation: Secondary | ICD-10-CM | POA: Diagnosis not present

## 2018-11-12 DIAGNOSIS — B20 Human immunodeficiency virus [HIV] disease: Secondary | ICD-10-CM | POA: Diagnosis present

## 2018-11-12 DIAGNOSIS — Z23 Encounter for immunization: Secondary | ICD-10-CM

## 2018-11-12 DIAGNOSIS — Z90710 Acquired absence of both cervix and uterus: Secondary | ICD-10-CM

## 2018-11-12 DIAGNOSIS — J440 Chronic obstructive pulmonary disease with acute lower respiratory infection: Secondary | ICD-10-CM | POA: Diagnosis present

## 2018-11-12 LAB — CBC WITH DIFFERENTIAL/PLATELET
Abs Immature Granulocytes: 0.02 10*3/uL (ref 0.00–0.07)
BASOS PCT: 0 %
Basophils Absolute: 0 10*3/uL (ref 0.0–0.1)
Eosinophils Absolute: 0.5 10*3/uL (ref 0.0–0.5)
Eosinophils Relative: 7 %
HEMATOCRIT: 39.9 % (ref 36.0–46.0)
HEMOGLOBIN: 12 g/dL (ref 12.0–15.0)
IMMATURE GRANULOCYTES: 0 %
LYMPHS ABS: 2.6 10*3/uL (ref 0.7–4.0)
LYMPHS PCT: 35 %
MCH: 29.9 pg (ref 26.0–34.0)
MCHC: 30.1 g/dL (ref 30.0–36.0)
MCV: 99.5 fL (ref 80.0–100.0)
MONO ABS: 0.6 10*3/uL (ref 0.1–1.0)
MONOS PCT: 9 %
NEUTROS PCT: 49 %
Neutro Abs: 3.6 10*3/uL (ref 1.7–7.7)
PLATELETS: 255 10*3/uL (ref 150–400)
RBC: 4.01 MIL/uL (ref 3.87–5.11)
RDW: 13.3 % (ref 11.5–15.5)
WBC: 7.4 10*3/uL (ref 4.0–10.5)
nRBC: 0 % (ref 0.0–0.2)

## 2018-11-12 LAB — BASIC METABOLIC PANEL
ANION GAP: 6 (ref 5–15)
BUN: 18 mg/dL (ref 8–23)
CHLORIDE: 105 mmol/L (ref 98–111)
CO2: 30 mmol/L (ref 22–32)
Calcium: 8.7 mg/dL — ABNORMAL LOW (ref 8.9–10.3)
Creatinine, Ser: 0.76 mg/dL (ref 0.44–1.00)
GFR calc Af Amer: >60 mL/min (ref >60)
Glucose, Bld: 127 mg/dL — ABNORMAL HIGH (ref 70–99)
POTASSIUM: 3.8 mmol/L (ref 3.5–5.1)
Sodium: 141 mmol/L (ref 135–145)

## 2018-11-12 LAB — I-STAT TROPONIN, ED: TROPONIN I, POC: 0 ng/mL (ref 0.00–0.08)

## 2018-11-12 LAB — BRAIN NATRIURETIC PEPTIDE: B NATRIURETIC PEPTIDE 5: 23.5 pg/mL (ref 0.0–100.0)

## 2018-11-12 MED ORDER — ALBUTEROL (5 MG/ML) CONTINUOUS INHALATION SOLN
10.0000 mg/h | INHALATION_SOLUTION | RESPIRATORY_TRACT | Status: DC
  Administered 2018-11-12: 10 mg/h via RESPIRATORY_TRACT
  Filled 2018-11-12: qty 20

## 2018-11-12 MED ORDER — IBUPROFEN 200 MG PO TABS
200.0000 mg | ORAL_TABLET | Freq: Four times a day (QID) | ORAL | Status: DC | PRN
  Administered 2018-11-13: 200 mg via ORAL
  Filled 2018-11-12 (×2): qty 1

## 2018-11-12 MED ORDER — IPRATROPIUM-ALBUTEROL 0.5-2.5 (3) MG/3ML IN SOLN: 3.0000 mL | Freq: Four times a day (QID) | RESPIRATORY_TRACT | Status: DC | PRN

## 2018-11-12 MED ORDER — MAGNESIUM SULFATE 2 GM/50ML IV SOLN
2.0000 g | Freq: Once | INTRAVENOUS | Status: AC
  Administered 2018-11-12: 2 g via INTRAVENOUS
  Filled 2018-11-12: qty 50

## 2018-11-12 MED ORDER — HYDROCHLOROTHIAZIDE 12.5 MG PO CAPS
12.5000 mg | ORAL_CAPSULE | Freq: Every day | ORAL | Status: DC
  Administered 2018-11-13 – 2018-11-14 (×2): 12.5 mg via ORAL
  Filled 2018-11-12 (×2): qty 1

## 2018-11-12 MED ORDER — PANTOPRAZOLE SODIUM 40 MG PO TBEC
40.0000 mg | DELAYED_RELEASE_TABLET | Freq: Every day | ORAL | Status: DC
  Administered 2018-11-13 – 2018-11-14 (×2): 40 mg via ORAL
  Filled 2018-11-12 (×2): qty 1

## 2018-11-12 MED ORDER — MELOXICAM 7.5 MG PO TABS
15.0000 mg | ORAL_TABLET | Freq: Every day | ORAL | Status: DC
  Administered 2018-11-13 – 2018-11-14 (×2): 15 mg via ORAL
  Filled 2018-11-12 (×2): qty 2

## 2018-11-12 MED ORDER — SODIUM CHLORIDE 0.9 % IV SOLN
500.0000 mg | INTRAVENOUS | Status: AC
  Administered 2018-11-13: 500 mg via INTRAVENOUS

## 2018-11-12 MED ORDER — ENOXAPARIN SODIUM 40 MG/0.4ML ~~LOC~~ SOLN
40.0000 mg | Freq: Every day | SUBCUTANEOUS | Status: DC
  Administered 2018-11-13 – 2018-11-14 (×2): 40 mg via SUBCUTANEOUS
  Filled 2018-11-12 (×2): qty 0.4

## 2018-11-12 MED ORDER — IPRATROPIUM-ALBUTEROL 0.5-2.5 (3) MG/3ML IN SOLN
3.0000 mL | RESPIRATORY_TRACT | Status: AC
  Administered 2018-11-12 (×3): 3 mL via RESPIRATORY_TRACT
  Filled 2018-11-12: qty 9

## 2018-11-12 MED ORDER — ALBUTEROL SULFATE HFA 108 (90 BASE) MCG/ACT IN AERS: 1.0000 | INHALATION_SPRAY | Freq: Four times a day (QID) | RESPIRATORY_TRACT | Status: DC | PRN

## 2018-11-12 MED ORDER — ALBUTEROL SULFATE (2.5 MG/3ML) 0.083% IN NEBU: 2.5000 mg | INHALATION_SOLUTION | RESPIRATORY_TRACT | Status: DC | PRN

## 2018-11-12 MED ORDER — AZITHROMYCIN 500 MG PO TABS
500.0000 mg | ORAL_TABLET | Freq: Every day | ORAL | Status: DC
  Administered 2018-11-14 (×2): 500 mg via ORAL
  Filled 2018-11-12 (×2): qty 1

## 2018-11-12 MED ORDER — UMECLIDINIUM-VILANTEROL 62.5-25 MCG/INH IN AEPB
1.0000 | INHALATION_SPRAY | Freq: Every day | RESPIRATORY_TRACT | Status: DC
  Administered 2018-11-13 – 2018-11-14 (×2): 1 via RESPIRATORY_TRACT
  Filled 2018-11-12: qty 14

## 2018-11-12 MED ORDER — ESCITALOPRAM OXALATE 10 MG PO TABS
20.0000 mg | ORAL_TABLET | Freq: Every day | ORAL | Status: DC
  Administered 2018-11-13 – 2018-11-14 (×2): 20 mg via ORAL
  Filled 2018-11-12 (×2): qty 2

## 2018-11-12 MED ORDER — PREDNISONE 20 MG PO TABS
60.0000 mg | ORAL_TABLET | Freq: Once | ORAL | Status: AC
  Administered 2018-11-12: 60 mg via ORAL
  Filled 2018-11-12: qty 3

## 2018-11-12 MED ORDER — PREDNISONE 20 MG PO TABS
40.0000 mg | ORAL_TABLET | Freq: Every day | ORAL | Status: DC
  Administered 2018-11-13: 40 mg via ORAL
  Filled 2018-11-12: qty 2

## 2018-11-12 MED ORDER — FUROSEMIDE 10 MG/ML IJ SOLN
40.0000 mg | Freq: Once | INTRAMUSCULAR | Status: AC
  Administered 2018-11-12: 40 mg via INTRAVENOUS
  Filled 2018-11-12: qty 4

## 2018-11-12 MED ORDER — QUETIAPINE FUMARATE 25 MG PO TABS
200.0000 mg | ORAL_TABLET | Freq: Every day | ORAL | Status: DC
  Administered 2018-11-13 (×2): 200 mg via ORAL
  Filled 2018-11-12 (×2): qty 8
  Filled 2018-11-12: qty 1

## 2018-11-12 NOTE — ED Notes (Signed)
ED TO INPATIENT HANDOFF REPORT  Name/Age/Gender Diane Sutton 62 y.o. female  Code Status    Code Status Orders  (From admission, onward)         Start     Ordered   11/12/18 2308  Full code  Continuous     11/12/18 2309        Code Status History    Date Active Date Inactive Code Status Order ID Comments User Context   10/24/2018 0251 10/26/2018 1750 Full Code [id number]  Oswald Hillock, MD Inpatient   05/16/2018 1859 05/17/2018 1420 Full Code [id number]  Deno Etienne, DO ED      Home/SNF/Other Home  Chief Complaint resp distress  Level of Care/Admitting Diagnosis ED Disposition    ED Disposition Condition Comment   [place]: [facility] [[id number]]  Level of Care: Med-Surg [16]  I expect the patient will be discharged within 24 hours: No (not a candidate for 5C-Observation unit)  Diagnosis: Acute bronchitis [466.0.ICD-9-CM]  Admitting Physician: Etta Quill [[id number]]  Attending Physician: Etta Quill [[id number]]  PT Class (Do Not Modify): Observation [104]  PT Acc Code (Do Not Modify): Observation [10022]       Medical History Past Medical History:  Diagnosis Date  . Depression   . gastric ca   . HIV (human immunodeficiency virus infection) ([place])    positive-non active  . Hypertension   . Schizophrenia ([place])     Allergies No Known Allergies  IV Location/Drains/Wounds Patient Lines/Drains/Airways Status   Active Line/Drains/Airways    Name:   Placement date:   Placement time:   Site:   Days:   Peripheral IV 11/12/18 Right Antecubital   11/12/18    2022    Antecubital   less than 1   External Urinary Catheter   10/24/18    0306    -   19          Labs/Imaging Results for orders placed or performed during the Sutton encounter of 11/12/18 (from the past 48 hour(s))  CBC with Differential     Status: None   Collection Time: 11/12/18  8:21 PM  Result Value Ref Range   WBC 7.4 4.0 - 10.5 K/uL   RBC 4.01 3.87 - 5.11  MIL/uL   Hemoglobin 12.0 12.0 - 15.0 g/dL   HCT 39.9 36.0 - 46.0 %   MCV 99.5 80.0 - 100.0 fL   MCH 29.9 26.0 - 34.0 pg   MCHC 30.1 30.0 - 36.0 g/dL   RDW 13.3 11.5 - 15.5 %   Platelets 255 150 - 400 K/uL   nRBC 0.0 0.0 - 0.2 %   Neutrophils Relative % 49 %   Neutro Abs 3.6 1.7 - 7.7 K/uL   Lymphocytes Relative 35 %   Lymphs Abs 2.6 0.7 - 4.0 K/uL   Monocytes Relative 9 %   Monocytes Absolute 0.6 0.1 - 1.0 K/uL   Eosinophils Relative 7 %   Eosinophils Absolute 0.5 0.0 - 0.5 K/uL   Basophils Relative 0 %   Basophils Absolute 0.0 0.0 - 0.1 K/uL   Immature Granulocytes 0 %   Abs Immature Granulocytes 0.02 0.00 - 0.07 K/uL    Comment: Performed at [facility], [address], [place], [place] [address]  Basic metabolic panel     Status: Abnormal   Collection Time: 11/12/18  8:21 PM  Result Value Ref Range   Sodium 141 135 - 145 mmol/L   Potassium  3.8 3.5 - 5.1 mmol/L   Chloride 105 98 - 111 mmol/L   CO2 30 22 - 32 mmol/L   Glucose, Bld 127 (H) 70 - 99 mg/dL   BUN 18 8 - 23 mg/dL   Creatinine, Ser 0.76 0.44 - 1.00 mg/dL   Calcium 8.7 (L) 8.9 - 10.3 mg/dL   GFR calc non Af Amer >60 >60 mL/min   GFR calc Af Amer >60 >60 mL/min    Comment: (NOTE) The eGFR has been calculated using the CKD EPI equation. This calculation has not been validated in all clinical situations. eGFR's persistently <60 mL/min signify possible Chronic Kidney Disease.    Anion gap 6 5 - 15    Comment: Performed at [facility] [address] [place], [place] [address]  I-stat troponin, ED     Status: None   Collection Time: 11/12/18  8:32 PM  Result Value Ref Range   Troponin i, poc 0.00 0.00 - 0.08 ng/mL   Comment 3            Comment: Due to the release kinetics of cTnI, a negative result within the first hours of the onset of symptoms does not rule out myocardial infarction with certainty. If myocardial infarction is still suspected, repeat the test at appropriate intervals.     Dg Chest Port 1 View  Result Date: 11/12/2018 CLINICAL DATA:  Acute onset of shortness of breath. EXAM: PORTABLE CHEST 1 VIEW COMPARISON:  Chest radiograph performed 10/26/2018 FINDINGS: The lungs are well-aerated. Vascular congestion is noted. Increased interstitial markings raise concern for pulmonary edema. There is no evidence of pleural effusion or pneumothorax. The cardiomediastinal silhouette is borderline enlarged. No acute osseous abnormalities are seen. IMPRESSION: Vascular congestion and borderline cardiomegaly. Increased interstitial markings raise concern for pulmonary edema. Electronically Signed   By: Garald Balding M.D.   On: 11/12/2018 21:31   EKG Interpretation  Date/Time:  Wednesday November 12 2018 20:15:04 EST Ventricular Rate:  84 PR Interval:    QRS Duration: 79 QT Interval:  356 QTC Calculation: 421 R Axis:   52 Text Interpretation:  Sinus rhythm Low voltage, precordial leads No significant change since last tracing Confirmed by Deno Etienne [phone]) on 11/12/2018 8:19:23 PM   Pending Labs Unresulted Labs (From admission, onward)    Start     Ordered   11/12/18 2308  Brain natriuretic peptide  Once,   R     11/12/18 2309   11/12/18 2307  HIV antibody  Once,   R     11/12/18 2309   11/12/18 2243  Brain natriuretic peptide  Once,   R     11/12/18 2242          Vitals/Pain Today's Vitals   11/12/18 2145 11/12/18 2200 11/12/18 2230 11/12/18 2310  BP: 139/64 129/65 138/69   Pulse: 93 97 92   Resp: (!) 24 (!) 24 (!) 25   Temp:      SpO2: 91% 95% 95% 94%  PainSc:        Isolation Precautions No active isolations  Medications Medications  albuterol (PROVENTIL,VENTOLIN) solution continuous neb (10 mg/hr Nebulization New Bag/Given 11/12/18 2306)  QUEtiapine (SEROQUEL) tablet 200 mg (has no administration in time range)  escitalopram (LEXAPRO) tablet 20 mg (has no administration in time range)  pantoprazole (PROTONIX) EC tablet 40 mg (has no  administration in time range)  hydrochlorothiazide (MICROZIDE) capsule 12.5 mg (has no administration in time range)  ibuprofen (ADVIL,MOTRIN) tablet 200 mg (has  no administration in time range)  meloxicam (MOBIC) tablet 15 mg (has no administration in time range)  enoxaparin (LOVENOX) injection 40 mg (has no administration in time range)  predniSONE (DELTASONE) tablet 40 mg (has no administration in time range)  azithromycin (ZITHROMAX) 500 mg in sodium chloride 0.9 % 250 mL IVPB (has no administration in time range)    Followed by  azithromycin (ZITHROMAX) tablet 500 mg (has no administration in time range)  albuterol (PROVENTIL) (2.5 MG/3ML) 0.083% nebulizer solution 2.5 mg (has no administration in time range)  umeclidinium-vilanterol (ANORO ELLIPTA) 62.5-25 MCG/INH 1 puff (has no administration in time range)  ipratropium-albuterol (DUONEB) 0.5-2.5 (3) MG/3ML nebulizer solution 3 mL (3 mLs Nebulization Given 11/12/18 2050)  predniSONE (DELTASONE) tablet 60 mg (60 mg Oral Given 11/12/18 2050)  magnesium sulfate IVPB 2 g 50 mL (0 g Intravenous Stopped 11/12/18 2113)  furosemide (LASIX) injection 40 mg (40 mg Intravenous Given 11/12/18 2229)    Mobility Walks

## 2018-11-12 NOTE — ED Notes (Signed)
Per provider, keep patient, finish neb, observe and send to med surge

## 2018-11-12 NOTE — ED Notes (Signed)
Paged admitting provider to notify them the patient needs to go to stepdown d/t continuous neb

## 2018-11-12 NOTE — ED Provider Notes (Signed)
[facility] EMERGENCY DEPARTMENT Provider Note   CSN: [id number] Arrival date & time: 11/12/18  2008     History   Chief Complaint Chief Complaint  Patient presents with  . Shortness of Breath    HPI Diane Sutton is a 62 y.o. female.  62 yo F with a chief complaints of shortness of breath and cough.  Going on for the past couple days.  Feels like it slowly getting worse.  Given breathing treatments with EMS with some improvement.  Has had some mild worsening lower extremity edema from baseline.  Has some chest pain with coughing.  Denies fevers or chills.  Unsure of sick contacts.  The history is provided by the patient and the EMS personnel.  Shortness of Breath  This is a recurrent problem. The average episode lasts 2 days. The problem occurs continuously.The current episode started 2 days ago. The problem has been gradually worsening. Associated symptoms include chest pain and leg swelling. Pertinent negatives include no fever, no headaches, no rhinorrhea, no wheezing and no vomiting. She has tried nothing for the symptoms. The treatment provided no relief. She has had prior hospitalizations. She has had prior ED visits. She has had no prior ICU admissions. Associated medical issues include COPD.    Past Medical History:  Diagnosis Date  . Depression   . gastric ca   . HIV (human immunodeficiency virus infection) ([place])    positive-non active  . Hypertension   . Schizophrenia [facility])     Patient Active Problem List   Diagnosis Date Noted  . Acute respiratory failure ([place]) 10/24/2018  . HIV (human immunodeficiency virus infection) ([place])   . Paranoid schizophrenia ([place]) 02/05/2011  . SKIN RASH 10/04/2010  . ALLERGIC RHINITIS 05/02/2010  . CANDIDIASIS 04/05/2010  . THYROID NODULE, RIGHT 10/07/2009  . PNEUMONIA 09/23/2009  . CERVICAL RADICULOPATHY, LEFT 09/12/2009  . UTI 08/10/2009  . CHEST PAIN UNSPECIFIED 08/10/2009  . 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 INGROWING NAIL 10/05/2008  . PRURITUS, EARS 06/08/2008  . OTALGIA 06/02/2008  . VAGINITIS, BACTERIAL 03/31/2008  . ARTHRITIS, RIGHT KNEE 03/23/2008  . OTHER ABNORMAL BLOOD CHEMISTRY 01/29/2008  . GOITER, MULTINODULAR 08/29/2007  . HYPERTHYROIDISM 08/29/2007  . DEPRESSION 08/29/2007  . MIGRAINE, CHRONIC 08/29/2007  . STRABISMUS 08/29/2007  . AUDITORY HALLUCINATION 08/29/2007    Past Surgical History:  Procedure Laterality Date  . ABDOMINAL HYSTERECTOMY    . ANKLE SURGERY  2004   orif left  . FINGER SURGERY  2004   i/d lt middle finger  . STRABISMUS SURGERY Left 12/11/2007  . STRABISMUS SURGERY Bilateral 01/22/2014   Procedure: REPAIR STRABISMUS BILATERAL EYES;  Surgeon: Derry Skill, MD;  Location: [facility];  Service: Ophthalmology;  Laterality: Bilateral;  . THYROID SURGERY     needle tx     OB History   None      Home Medications    Prior to Admission medications   Medication Sig Start Date End Date Taking? Authorizing Provider  escitalopram (LEXAPRO) 20 MG tablet Take 1 tablet (20 mg total) by mouth daily for 14 days. 05/17/18 11/12/18 Yes Patrecia Pour, NP  hydrochlorothiazide (MICROZIDE) 12.5 MG capsule Take 12.5 mg by mouth daily.  04/04/18  Yes [provider]  ibuprofen (ADVIL,MOTRIN) 200 MG tablet Take 200 mg by mouth every 6 (six) hours as needed for moderate pain.   Yes [provider]  ipratropium-albuterol (DUONEB) 0.5-2.5 (3) MG/3ML SOLN Take 3 mLs by nebulization every 6 (six)  hours as needed. Patient taking differently: Take 3 mLs by nebulization every 6 (six) hours as needed (wheezing).  10/26/18  Yes Sheikh, Omair Latif, DO  meloxicam (MOBIC) 15 MG tablet Take 15 mg by mouth daily.   Yes [provider]  nystatin cream (MYCOSTATIN) Apply 1 application topically 2 (two) times daily. Apply to affected area twice daily for 1 week Patient taking differently: Apply 1 application topically 2 (two)  times daily.  05/04/18  Yes Jacqlyn Larsen, PA-C  pantoprazole (PROTONIX) 40 MG tablet Take 1 tablet (40 mg total) by mouth daily. 11/02/18  Yes Pollina, Gwenyth Allegra, MD  PROAIR HFA 108 [phone] Base) MCG/ACT inhaler Take 1 puff by mouth every 6 (six) hours as needed for shortness of breath. 10/08/18  Yes [provider]  QUEtiapine (SEROQUEL) 200 MG tablet Take 1 tablet (200 mg total) by mouth at bedtime. 05/17/18  Yes Patrecia Pour, NP    Family History Family History  Problem Relation Age of Onset  . Heart disease Mother   . Colon cancer Neg Hx     Social History Social History   Tobacco Use  . Smoking status: Never Smoker  . Smokeless tobacco: Former Network engineer Use Topics  . Alcohol use: No  . Drug use: No     Allergies   Patient has no known allergies.   Review of Systems Review of Systems  Constitutional: Negative for chills and fever.  HENT: Positive for congestion. Negative for rhinorrhea.   Eyes: Negative for redness and visual disturbance.  Respiratory: Positive for shortness of breath. Negative for wheezing.   Cardiovascular: Positive for chest pain and leg swelling. Negative for palpitations.  Gastrointestinal: Negative for nausea and vomiting.  Genitourinary: Negative for dysuria and urgency.  Musculoskeletal: Negative for arthralgias and myalgias.  Skin: Negative for pallor and wound.  Neurological: Negative for dizziness and headaches.     Physical Exam Updated Vital Signs BP 138/69   Pulse 92   Temp 97.6 F (36.4 C)   Resp (!) 25   SpO2 95%   Physical Exam  Constitutional: She is oriented to person, place, and time. She appears well-developed and well-nourished. No distress.  HENT:  Head: Normocephalic and atraumatic.  Eyes: Pupils are equal, round, and reactive to light. EOM are normal.  Neck: Normal range of motion. Neck supple.  Cardiovascular: Normal rate and regular rhythm. Exam reveals no gallop and no friction rub.  No murmur  heard. Pulmonary/Chest: Effort normal. She has wheezes (diffuse, expiratory). She has no rales.  Abdominal: Soft. She exhibits no distension. There is no tenderness.  Musculoskeletal: She exhibits no edema or tenderness.  Neurological: She is alert and oriented to person, place, and time.  Skin: Skin is warm and dry. She is not diaphoretic.  Psychiatric: She has a normal mood and affect. Her behavior is normal.  Nursing note and vitals reviewed.    ED Treatments / Results  Labs (all labs ordered are listed, but only abnormal results are displayed) Labs Reviewed  BASIC METABOLIC PANEL - Abnormal; Notable for the following components:      Result Value   Glucose, Bld 127 (*)    Calcium 8.7 (*)    All other components within normal limits  CBC WITH DIFFERENTIAL/PLATELET  BRAIN NATRIURETIC PEPTIDE  I-STAT TROPONIN, ED    EKG EKG Interpretation  Date/Time:  Wednesday November 12 2018 20:15:04 EST Ventricular Rate:  84 PR Interval:    QRS Duration: 79 QT Interval:  356 QTC Calculation: 421 R Axis:   52 Text Interpretation:  Sinus rhythm Low voltage, precordial leads No significant change since last tracing Confirmed by Deno Etienne [phone]) on 11/12/2018 8:19:23 PM   Radiology Dg Chest Port 1 View  Result Date: 11/12/2018 CLINICAL DATA:  Acute onset of shortness of breath. EXAM: PORTABLE CHEST 1 VIEW COMPARISON:  Chest radiograph performed 10/26/2018 FINDINGS: The lungs are well-aerated. Vascular congestion is noted. Increased interstitial markings raise concern for pulmonary edema. There is no evidence of pleural effusion or pneumothorax. The cardiomediastinal silhouette is borderline enlarged. No acute osseous abnormalities are seen. IMPRESSION: Vascular congestion and borderline cardiomegaly. Increased interstitial markings raise concern for pulmonary edema. Electronically Signed   By: Garald Balding M.D.   On: 11/12/2018 21:31    Procedures Procedures (including critical care  time)  Medications Ordered in ED Medications  albuterol (PROVENTIL,VENTOLIN) solution continuous neb (10 mg/hr Nebulization New Bag/Given 11/12/18 2306)  QUEtiapine (SEROQUEL) tablet 200 mg (has no administration in time range)  escitalopram (LEXAPRO) tablet 20 mg (has no administration in time range)  pantoprazole (PROTONIX) EC tablet 40 mg (has no administration in time range)  hydrochlorothiazide (MICROZIDE) capsule 12.5 mg (has no administration in time range)  ibuprofen (ADVIL,MOTRIN) tablet 200 mg (has no administration in time range)  meloxicam (MOBIC) tablet 15 mg (has no administration in time range)  ipratropium-albuterol (DUONEB) 0.5-2.5 (3) MG/3ML nebulizer solution 3 mL (3 mLs Nebulization Given 11/12/18 2050)  predniSONE (DELTASONE) tablet 60 mg (60 mg Oral Given 11/12/18 2050)  magnesium sulfate IVPB 2 g 50 mL (0 g Intravenous Stopped 11/12/18 2113)  furosemide (LASIX) injection 40 mg (40 mg Intravenous Given 11/12/18 2229)     Initial Impression / Assessment and Plan / ED Course  I have reviewed the triage vital signs and the nursing notes.  Pertinent labs & imaging results that were available during my care of the patient were reviewed by me and considered in my medical decision making (see chart for details).     62 yo F with a chief complaint of shortness of breath.  Going on for the past couple days.  Associate with cough and congestion.  With audible wheezes on my exam.  I suspect that this is a COPD exacerbation though the patient unfortunately has untreated HIV and her last CD4 count was 340.  Will obtain lab work chest x-ray EKG troponin.  Chest x-ray reviewed by me without focal infiltrate.  Radiology read concerning for pulmonary edema.  Patient does have trace lower extremity edema.  Lung exam is still more consistent with COPD.  She has had some significant improvement with 3 duo nebs back-to-back and 10 mg dose of albuterol prior to arrival.  She still is  having some significant wheezes though is improved respiratory effort at baseline her O2 sat at rest is 91%.  I had the patient ambulated and her oxygen saturation went down into the mid 80s.  With her continued wheezing and shortness of breath on exertion will discuss with the hospitalist for admission.  CRITICAL CARE Performed by: Cecilio Asper   Total critical care time: 80 minutes  Critical care time was exclusive of separately billable procedures and treating other patients.  Critical care was necessary to treat or prevent imminent or life-threatening deterioration.  Critical care was time spent personally by me on the following activities: development of treatment plan with patient and/or surrogate as well as nursing, discussions with consultants, evaluation of patient's response to treatment, examination of  patient, obtaining history from patient or surrogate, ordering and performing treatments and interventions, ordering and review of laboratory studies, ordering and review of radiographic studies, pulse oximetry and re-evaluation of patient's condition.  The patients results and plan were reviewed and discussed.   Any x-rays performed were independently reviewed by myself.   Differential diagnosis were considered with the presenting HPI.  Medications  albuterol (PROVENTIL,VENTOLIN) solution continuous neb (10 mg/hr Nebulization New Bag/Given 11/12/18 2306)  QUEtiapine (SEROQUEL) tablet 200 mg (has no administration in time range)  escitalopram (LEXAPRO) tablet 20 mg (has no administration in time range)  pantoprazole (PROTONIX) EC tablet 40 mg (has no administration in time range)  hydrochlorothiazide (MICROZIDE) capsule 12.5 mg (has no administration in time range)  ibuprofen (ADVIL,MOTRIN) tablet 200 mg (has no administration in time range)  meloxicam (MOBIC) tablet 15 mg (has no administration in time range)  ipratropium-albuterol (DUONEB) 0.5-2.5 (3) MG/3ML nebulizer  solution 3 mL (3 mLs Nebulization Given 11/12/18 2050)  predniSONE (DELTASONE) tablet 60 mg (60 mg Oral Given 11/12/18 2050)  magnesium sulfate IVPB 2 g 50 mL (0 g Intravenous Stopped 11/12/18 2113)  furosemide (LASIX) injection 40 mg (40 mg Intravenous Given 11/12/18 2229)    Vitals:   11/12/18 2100 11/12/18 2145 11/12/18 2200 11/12/18 2230  BP: 139/64 139/64 129/65 138/69  Pulse: 88 93 97 92  Resp: 20 (!) 24 (!) 24 (!) 25  Temp:      SpO2: 100% 91% 95% 95%    Final diagnoses:  COPD exacerbation ([place])    Admission/ observation were discussed with the admitting physician, patient and/or family and they are comfortable with the plan.    Final Clinical Impressions(s) / ED Diagnoses   Final diagnoses:  COPD exacerbation [facility])    ED Discharge Orders    None       Deno Etienne, DO 11/12/18 2309

## 2018-11-12 NOTE — ED Triage Notes (Signed)
[facility] with their report of  c/o SOB +cough, for 2 days. Wheezing throughout, 15 albuterol and 1mg  atrovent and 125 Solumedrol given en route. 18LAC established by EMS. 12lead unremarkable.

## 2018-11-12 NOTE — ED Notes (Signed)
Placed external cath on pt  

## 2018-11-12 NOTE — ED Notes (Signed)
Walked pt, pt o2 dropped down to 87%, pt was was complaining on being SOB.

## 2018-11-12 NOTE — H&P (Signed)
History and Physical    Diane Sutton ESP:[id number] DOB: 11/01/1956 DOA: 11/12/2018  PCP: Eston Esters, NP  Patient coming from: [facility]  I have personally briefly reviewed patient's old medical records in [place]  Chief Complaint: SOB  HPI: Diane Sutton is a 62 y.o. female with medical history significant of HTN, paranoid schizophrenia, ? HIV (und viral load, non-reactive screen, CD4 390 last month, not on HAART).  Patient was admitted from 10/25-10/27 with SOB, wheezing, and what was ultimately believed to be COPD exacerbation.  Patient presents to the ED today with C/O SOB and cough.  Ongoing for past couple of days, slowly getting worse.  Some improvement with breathing treatments by EMS.  Some mildly worsening BLE edema from baseline.  No fevers nor chills.  Unsure of sick contacts but lives in homeless shelter.   ED Course: CXR with ? pulm edema.  Lung sounds more wheezy than wet though.  Given 40mg  IV lasix x1.  Given prednisone and neb treatments with some improvement.  Desats to 87% with ambulation though so hospitalist asked to admit.   Review of Systems: As per HPI otherwise 10 point review of systems negative.   Past Medical History:  Diagnosis Date  . Depression   . gastric ca   . HIV (human immunodeficiency virus infection) ([place])    positive-non active  . Hypertension   . Schizophrenia [facility])     Past Surgical History:  Procedure Laterality Date  . ABDOMINAL HYSTERECTOMY    . ANKLE SURGERY  2004   orif left  . FINGER SURGERY  2004   i/d lt middle finger  . STRABISMUS SURGERY Left 12/11/2007  . STRABISMUS SURGERY Bilateral 01/22/2014   Procedure: REPAIR STRABISMUS BILATERAL EYES;  Surgeon: Derry Skill, MD;  Location: [facility];  Service: Ophthalmology;  Laterality: Bilateral;  . THYROID SURGERY     needle tx     reports that she has never smoked. She has quit using smokeless tobacco. She reports that she does not  drink alcohol or use drugs.  No Known Allergies  Family History  Problem Relation Age of Onset  . Heart disease Mother   . Colon cancer Neg Hx      Prior to Admission medications   Medication Sig Start Date End Date Taking? Authorizing Provider  escitalopram (LEXAPRO) 20 MG tablet Take 1 tablet (20 mg total) by mouth daily for 14 days. 05/17/18 11/12/18 Yes Patrecia Pour, NP  hydrochlorothiazide (MICROZIDE) 12.5 MG capsule Take 12.5 mg by mouth daily.  04/04/18  Yes [provider]  ibuprofen (ADVIL,MOTRIN) 200 MG tablet Take 200 mg by mouth every 6 (six) hours as needed for moderate pain.   Yes [provider]  ipratropium-albuterol (DUONEB) 0.5-2.5 (3) MG/3ML SOLN Take 3 mLs by nebulization every 6 (six) hours as needed. Patient taking differently: Take 3 mLs by nebulization every 6 (six) hours as needed (wheezing).  10/26/18  Yes Sheikh, Omair Latif, DO  meloxicam (MOBIC) 15 MG tablet Take 15 mg by mouth daily.   Yes [provider]  nystatin cream (MYCOSTATIN) Apply 1 application topically 2 (two) times daily. Apply to affected area twice daily for 1 week Patient taking differently: Apply 1 application topically 2 (two) times daily.  05/04/18  Yes Jacqlyn Larsen, PA-C  pantoprazole (PROTONIX) 40 MG tablet Take 1 tablet (40 mg total) by mouth daily. 11/02/18  Yes Pollina, Gwenyth Allegra, MD  PROAIR HFA 108 [phone] Base) MCG/ACT inhaler Take 1  puff by mouth every 6 (six) hours as needed for shortness of breath. 10/08/18  Yes [provider]  QUEtiapine (SEROQUEL) 200 MG tablet Take 1 tablet (200 mg total) by mouth at bedtime. 05/17/18  Yes Patrecia Pour, NP    Physical Exam: Vitals:   11/12/18 2145 11/12/18 2200 11/12/18 2230 11/12/18 2310  BP: 139/64 129/65 138/69   Pulse: 93 97 92   Resp: (!) 24 (!) 24 (!) 25   Temp:      SpO2: 91% 95% 95% 94%    Constitutional: NAD, calm, comfortable Eyes: PERRL, lids and conjunctivae normal ENMT: Mucous membranes  are moist. Posterior pharynx clear of any exudate or lesions.Normal dentition.  Neck: normal, supple, no masses, no thyromegaly Respiratory: Diffuse wheezing. Cardiovascular: Regular rate and rhythm, no murmurs / rubs / gallops. No extremity edema. 2+ pedal pulses. No carotid bruits.  Abdomen: no tenderness, no masses palpated. No hepatosplenomegaly. Bowel sounds positive.  Musculoskeletal: no clubbing / cyanosis. No joint deformity upper and lower extremities. Good ROM, no contractures. Normal muscle tone.  Skin: no rashes, lesions, ulcers. No induration Neurologic: CN 2-12 grossly intact. Sensation intact, DTR normal. Strength 5/5 in all 4.  Psychiatric: Normal judgment and insight. Alert and oriented x 3. Normal mood.    Labs on Admission: I have personally reviewed following labs and imaging studies  CBC: Recent Labs  Lab 11/12/18 2021  WBC 7.4  NEUTROABS 3.6  HGB 12.0  HCT 39.9  MCV 99.5  PLT 854   Basic Metabolic Panel: Recent Labs  Lab 11/12/18 2021  NA 141  K 3.8  CL 105  CO2 30  GLUCOSE 127*  BUN 18  CREATININE 0.76  CALCIUM 8.7*   GFR: Estimated Creatinine Clearance: 98.9 mL/min (by C-G formula based on SCr of 0.76 mg/dL). Liver Function Tests: No results for input(s): AST, ALT, ALKPHOS, BILITOT, PROT, ALBUMIN in the last 168 hours. No results for input(s): LIPASE, AMYLASE in the last 168 hours. No results for input(s): AMMONIA in the last 168 hours. Coagulation Profile: No results for input(s): INR, PROTIME in the last 168 hours. Cardiac Enzymes: No results for input(s): CKTOTAL, CKMB, CKMBINDEX, TROPONINI in the last 168 hours. BNP (last 3 results) No results for input(s): PROBNP in the last 8760 hours. HbA1C: No results for input(s): HGBA1C in the last 72 hours. CBG: No results for input(s): GLUCAP in the last 168 hours. Lipid Profile: No results for input(s): CHOL, HDL, LDLCALC, TRIG, CHOLHDL, LDLDIRECT in the last 72 hours. Thyroid Function  Tests: No results for input(s): TSH, T4TOTAL, FREET4, T3FREE, THYROIDAB in the last 72 hours. Anemia Panel: No results for input(s): VITAMINB12, FOLATE, FERRITIN, TIBC, IRON, RETICCTPCT in the last 72 hours. Urine analysis:    Component Value Date/Time   COLORURINE YELLOW 11/02/2018 0618   APPEARANCEUR CLEAR 11/02/2018 0618   LABSPEC >1.046 (H) 11/02/2018 0618   PHURINE 5.0 11/02/2018 0618   GLUCOSEU NEGATIVE 11/02/2018 0618   HGBUR NEGATIVE 11/02/2018 0618   HGBUR negative 05/02/2010 1111   BILIRUBINUR NEGATIVE 11/02/2018 0618   KETONESUR NEGATIVE 11/02/2018 0618   PROTEINUR NEGATIVE 11/02/2018 0618   UROBILINOGEN 0.2 05/02/2010 1111   NITRITE NEGATIVE 11/02/2018 0618   LEUKOCYTESUR NEGATIVE 11/02/2018 0618    Radiological Exams on Admission: Dg Chest Port 1 View  Result Date: 11/12/2018 CLINICAL DATA:  Acute onset of shortness of breath. EXAM: PORTABLE CHEST 1 VIEW COMPARISON:  Chest radiograph performed 10/26/2018 FINDINGS: The lungs are well-aerated. Vascular congestion is noted. Increased interstitial markings  raise concern for pulmonary edema. There is no evidence of pleural effusion or pneumothorax. The cardiomediastinal silhouette is borderline enlarged. No acute osseous abnormalities are seen. IMPRESSION: Vascular congestion and borderline cardiomegaly. Increased interstitial markings raise concern for pulmonary edema. Electronically Signed   By: Garald Balding M.D.   On: 11/12/2018 21:31    EKG: Independently reviewed.  Assessment/Plan Principal Problem:   Acute bronchitis Active Problems:   Paranoid schizophrenia (HCC)   Question of HIV, but ruled out on viral panel, non-reactive titer, etc.    1. Acute bronchitis / COPD exacerbation - 1. Note she was just admitted for same a couple of weeks ago, ultimately decided to have COPD exacerbation that admission (see Dr. Weston Settle DC summary 10/27) 2. COPD exacerbation pathway 3. PRN and scheduled neb treatments 4. Start  LABA/LAMA 5. Prednisone 40mg  daily 6. Azithromycin 7. DDx includes: new onset CHF - 1. Got single dose lasix in ED 2. Checking BNP, if elevated then would obtain 2d echo 3. Lung sounds more wheezy than wet 2. HIV - 1. Not currently taking HAART 2. Apparently has history of "positive non-active HIV" with CD4 count of 390 in Oct but HIV screen is non-reactive and HIV RNA < 20. 3. PJP is thus felt unlikely. 3. Paranoid Schizophrenia - continue home meds  DVT prophylaxis: Lovenox Code Status: Full Family Communication: No family in room Disposition Plan: Home after admit Consults called: None Admission status: Place in obs    Jaycub Noorani, [facility] Pager [phone] Only works nights!  If 7AM-7PM, please contact the primary day team physician taking care of patient  [email or web address] Password [email or web address]  11/12/2018, 11:21 PM

## 2018-11-12 NOTE — ED Notes (Signed)
3e reports unable to take pt d/t continuous neb. Charge nurse notified of the same

## 2018-11-13 ENCOUNTER — Encounter (HOSPITAL_COMMUNITY): Payer: Self-pay

## 2018-11-13 DIAGNOSIS — Z791 Long term (current) use of non-steroidal anti-inflammatories (NSAID): Secondary | ICD-10-CM | POA: Diagnosis not present

## 2018-11-13 DIAGNOSIS — R0602 Shortness of breath: Secondary | ICD-10-CM | POA: Diagnosis present

## 2018-11-13 DIAGNOSIS — R0902 Hypoxemia: Secondary | ICD-10-CM | POA: Diagnosis present

## 2018-11-13 DIAGNOSIS — J440 Chronic obstructive pulmonary disease with acute lower respiratory infection: Secondary | ICD-10-CM | POA: Diagnosis present

## 2018-11-13 DIAGNOSIS — Z23 Encounter for immunization: Secondary | ICD-10-CM | POA: Diagnosis not present

## 2018-11-13 DIAGNOSIS — Z90710 Acquired absence of both cervix and uterus: Secondary | ICD-10-CM | POA: Diagnosis not present

## 2018-11-13 DIAGNOSIS — Z85028 Personal history of other malignant neoplasm of stomach: Secondary | ICD-10-CM | POA: Diagnosis not present

## 2018-11-13 DIAGNOSIS — F200 Paranoid schizophrenia: Secondary | ICD-10-CM | POA: Diagnosis not present

## 2018-11-13 DIAGNOSIS — I10 Essential (primary) hypertension: Secondary | ICD-10-CM | POA: Diagnosis present

## 2018-11-13 DIAGNOSIS — Z21 Asymptomatic human immunodeficiency virus [HIV] infection status: Secondary | ICD-10-CM | POA: Diagnosis present

## 2018-11-13 DIAGNOSIS — J441 Chronic obstructive pulmonary disease with (acute) exacerbation: Secondary | ICD-10-CM | POA: Diagnosis not present

## 2018-11-13 DIAGNOSIS — J209 Acute bronchitis, unspecified: Secondary | ICD-10-CM | POA: Diagnosis not present

## 2018-11-13 DIAGNOSIS — Z8249 Family history of ischemic heart disease and other diseases of the circulatory system: Secondary | ICD-10-CM | POA: Diagnosis not present

## 2018-11-13 LAB — BRAIN NATRIURETIC PEPTIDE: B Natriuretic Peptide: 42.5 pg/mL (ref 0.0–100.0)

## 2018-11-13 LAB — HIV ANTIBODY (ROUTINE TESTING W REFLEX): HIV Screen 4th Generation wRfx: NONREACTIVE

## 2018-11-13 MED ORDER — METHYLPREDNISOLONE SODIUM SUCC 40 MG IJ SOLR
40.0000 mg | Freq: Four times a day (QID) | INTRAMUSCULAR | Status: DC
  Administered 2018-11-13 – 2018-11-14 (×5): 40 mg via INTRAVENOUS
  Filled 2018-11-13 (×5): qty 1

## 2018-11-13 MED ORDER — FUROSEMIDE 10 MG/ML IJ SOLN
40.0000 mg | Freq: Once | INTRAMUSCULAR | Status: AC
  Administered 2018-11-13: 40 mg via INTRAVENOUS
  Filled 2018-11-13: qty 4

## 2018-11-13 MED ORDER — PNEUMOCOCCAL VAC POLYVALENT 25 MCG/0.5ML IJ INJ
0.5000 mL | INJECTION | INTRAMUSCULAR | Status: AC
  Administered 2018-11-14: 0.5 mL via INTRAMUSCULAR
  Filled 2018-11-13: qty 0.5

## 2018-11-13 MED ORDER — SODIUM CHLORIDE 0.9 % IV SOLN
INTRAVENOUS | Status: DC | PRN
  Administered 2018-11-13: 250 mL via INTRAVENOUS

## 2018-11-13 NOTE — Care Management Note (Signed)
Case Management Note  Patient Details  Name: Diane Sutton MRN: [id number] Date of Birth: 11-Oct-1956  Subjective/Objective:  Acute Bronchitis                 Action/Plan: Patient lives in a [place] [facility] [place]; PCP: Eston Esters, NP; has private insurance with Medicaid with prescription drug coverage; has a nebulizer machine; CM will continue to follow for progression of care.  Expected Discharge Date:     Possibly 11/16/2018             Expected Discharge Plan:  Homeless Shelter  In-House Referral:  Clinical Social Work  Discharge planning Services  CM Consult  Status of Service:  In process, will continue to follow  Sherrilyn Rist [phone] 11/13/2018, 10:27 AM

## 2018-11-13 NOTE — Progress Notes (Signed)
PROGRESS NOTE                                                                                                                                                                                                             Patient Demographics:    Diane Sutton, is a 62 y.o. female, DOB - [birth year], LNL:[id number]  Admit date - 11/12/2018   Admitting Physician Etta Quill, DO  Outpatient Primary MD for the patient is Eston Esters, NP  LOS - 0   Chief Complaint  Patient presents with  . Shortness of Breath       Brief Narrative    62 y.o. female with medical history significant of HTN, paranoid schizophrenia, ? HIV (und viral load, non-reactive screen, CD4 390 last month, not on HAART).  Patient presents with cough, shortness of breath, wheezing, treated for acute bronchitis/COPD exacerbation treatment   Subjective:    [facility] today has, No headache, No chest pain, No abdominal pain - No Nausea, ports some hoarseness, cough, nonproductive, reports some dyspnea, but significantly improved.   Assessment  & Plan :    Principal Problem:   Acute bronchitis Active Problems:   Paranoid schizophrenia ([place])   HIV positive but "non-active"   Acute bronchitis -We will with some COPD exacerbation, still having some wheezing despite receiving prednisone this morning, also her I was on Medrol 40 mg every 6 reports some cough, and has voice hoarseness, will continue with azithromycin, encouraged to use incentive spirometry and flutter valve. -Hypoxic 86% with PT today on exertion, no baseline oxygen demand, continue with oxygen for now, wean as tolerated -Continue with nebulizer treatments -Give 1 dose of prescribed Lasix today giving some evidence of congestion on imaging yesterday  Paranoid schizophrenia -Continue with home meds  HIV - Not currently taking HAART, Apparently has history of "positive non-active HIV" with CD4 count  of 390 in Oct but HIV screen is non-reactive and HIV RNA < 20.  Code Status : Full  Family Communication none at bedside  Disposition Plan  : To shelter when stable, social worker consulted  Barriers For Discharge : hypoxic on exertion with PT at 86%, baseline oxygen requirement, still with some wheezing, IV steroids  Consults  : None  Procedures  : none   DVT Prophylaxis  : Subcu Lovenox  Lab Results  Component Value Date   PLT 255 11/12/2018    Antibiotics  :   Anti-infectives (From admission, onward)   Start     Dose/Rate Route Frequency Ordered Stop   11/13/18 2315  azithromycin (ZITHROMAX) tablet 500 mg     500 mg Oral Daily 11/12/18 2309 11/17/18 0959   11/12/18 2315  azithromycin (ZITHROMAX) 500 mg in sodium chloride 0.9 % 250 mL IVPB     500 mg 250 mL/hr over 60 Minutes Intravenous Every 24 hours 11/12/18 2309 11/13/18 0244        Objective:   Vitals:   11/13/18 0047 11/13/18 0443 11/13/18 0826 11/13/18 1153  BP: (!) 147/77 (!) 144/81 118/68 (!) 141/79  Pulse: (!) 108 (!) 102 95 87  Resp: 20 20 20 20   Temp: 97.6 F (36.4 C) (!) 97.4 F (36.3 C) 98.6 F (37 C) 98.5 F (36.9 C)  TempSrc: Oral Oral Oral Oral  SpO2: 94% 98% 92% 95%  Weight:  134.7 kg    Height:  5\' 8"  (1.727 m)      Wt Readings from Last 3 Encounters:  11/13/18 134.7 kg  11/02/18 131.5 kg  10/24/18 135.5 kg     Intake/Output Summary (Last 24 hours) at 11/13/2018 1248 Last data filed at 11/13/2018 1100 Gross per 24 hour  Intake 848.03 ml  Output 200 ml  Net 648.03 ml     Physical Exam  Awake Alert, Oriented X 3, No new F.N deficits, Normal affect Symmetrical Chest wall movement, Good air movement bilaterally, scattered wheezing, but no use of accessory muscles RRR,No Gallops,Rubs or new Murmurs, No Parasternal Heave +ve B.Sounds, Abd Soft, No tenderness, No rebound - guarding or rigidity. No Cyanosis, Clubbing or edema, No new Rash or bruise     Data Review:     CBC Recent Labs  Lab 11/12/18 2021  WBC 7.4  HGB 12.0  HCT 39.9  PLT 255  MCV 99.5  MCH 29.9  MCHC 30.1  RDW 13.3  LYMPHSABS 2.6  MONOABS 0.6  EOSABS 0.5  BASOSABS 0.0    Chemistries  Recent Labs  Lab 11/12/18 2021  NA 141  K 3.8  CL 105  CO2 30  GLUCOSE 127*  BUN 18  CREATININE 0.76  CALCIUM 8.7*   ------------------------------------------------------------------------------------------------------------------ No results for input(s): CHOL, HDL, LDLCALC, TRIG, CHOLHDL, LDLDIRECT in the last 72 hours.  Lab Results  Component Value Date   HGBA1C 5.7 (H) 10/26/2018   ------------------------------------------------------------------------------------------------------------------ No results for input(s): TSH, T4TOTAL, T3FREE, THYROIDAB in the last 72 hours.  Invalid input(s): FREET3 ------------------------------------------------------------------------------------------------------------------ No results for input(s): VITAMINB12, FOLATE, FERRITIN, TIBC, IRON, RETICCTPCT in the last 72 hours.  Coagulation profile No results for input(s): INR, PROTIME in the last 168 hours.  No results for input(s): DDIMER in the last 72 hours.  Cardiac Enzymes No results for input(s): CKMB, TROPONINI, MYOGLOBIN in the last 168 hours.  Invalid input(s): CK ------------------------------------------------------------------------------------------------------------------    Component Value Date/Time   BNP 42.5 11/12/2018 2333    Inpatient Medications  Scheduled Meds: . azithromycin  500 mg Oral Daily  . enoxaparin (LOVENOX) injection  40 mg Subcutaneous Daily  . escitalopram  20 mg Oral Daily  . hydrochlorothiazide  12.5 mg Oral Daily  . meloxicam  15 mg Oral Q breakfast  . methylPREDNISolone (SOLU-MEDROL) injection  40 mg Intravenous Q6H  . pantoprazole  40 mg Oral Daily  . [START ON 11/14/2018] pneumococcal 23 valent vaccine  0.5 mL Intramuscular  Tomorrow-1000  . QUEtiapine  200  mg Oral QHS  . umeclidinium-vilanterol  1 puff Inhalation Daily   Continuous Infusions: . sodium chloride Stopped (11/13/18 0326)  . albuterol 10 mg/hr (11/12/18 2306)   PRN Meds:.sodium chloride, albuterol, ibuprofen  Micro Results No results found for this or any previous visit (from the past 240 hour(s)).  Radiology Reports Ct Chest W Contrast  Result Date: 10/23/2018 CLINICAL DATA:  Increased shortness of breath history of pneumonia EXAM: CT CHEST WITH CONTRAST TECHNIQUE: Multidetector CT imaging of the chest was performed during intravenous contrast administration. CONTRAST:  7mL OMNIPAQUE IOHEXOL 300 MG/ML  SOLN COMPARISON:  10/23/2018, 10/02/2018, 02/13/2016 radiographs FINDINGS: Cardiovascular: Nonaneurysmal aorta. Mild aortic atherosclerosis. Soft heart size within normal limits. No pericardial effusion Mediastinum/Nodes: Midline trachea. 2.3 cm heterogeneous right lobe nodule with calcification. No significant mediastinal adenopathy. Esophagus within normal Lungs/Pleura: No focal consolidation or pleural effusion. No pneumothorax. Minimal left lower lobe peribronchial thickening. Upper Abdomen: No acute abnormality. Musculoskeletal: No chest wall abnormality. No acute or significant osseous findings. IMPRESSION: 1. No focal pneumonia 2. Mild left lower lobe peribronchial thickening which could be secondary to airways inflammation/reactive airways 3. 2.3 cm right thyroid nodule which may be evaluated with nonemergent thyroid ultrasound as indicated Aortic Atherosclerosis (ICD10-I70.0). Electronically Signed   By: Donavan Foil M.D.   On: 10/23/2018 23:03   Ct Abdomen Pelvis W Contrast  Result Date: 11/02/2018 CLINICAL DATA:  Mid abdominal pain EXAM: CT ABDOMEN AND PELVIS WITH CONTRAST TECHNIQUE: Multidetector CT imaging of the abdomen and pelvis was performed using the standard protocol following bolus administration of intravenous contrast. CONTRAST:   114mL ISOVUE-300 IOPAMIDOL (ISOVUE-300) INJECTION 61% COMPARISON:  None FINDINGS: Lower chest: Scar and/or subsegmental atelectasis within the lingula and posterior left lower lobe. No pleural fluid. Hepatobiliary: No focal liver abnormality is seen. No gallstones, gallbladder wall thickening, or biliary dilatation. Pancreas: Unremarkable. No pancreatic ductal dilatation or surrounding inflammatory changes. Spleen: Normal in size without focal abnormality. Adrenals/Urinary Tract: Normal adrenal glands. The kidneys are unremarkable. The urinary bladder appears within normal limits. Stomach/Bowel: Stomach is within normal limits. Appendix appears normal. No evidence of bowel wall thickening, distention, or inflammatory changes. Vascular/Lymphatic: No significant vascular findings are present. No enlarged abdominal or pelvic lymph nodes. Reproductive: Status post hysterectomy. No adnexal masses. Other: No abdominal wall hernia or abnormality. No abdominopelvic ascites. Musculoskeletal: Spondylosis noted within the lumbar spine. IMPRESSION: 1. No acute findings within the abdomen or pelvis. Electronically Signed   By: Kerby Moors M.D.   On: 11/02/2018 01:20   Dg Chest Port 1 View  Result Date: 11/12/2018 CLINICAL DATA:  Acute onset of shortness of breath. EXAM: PORTABLE CHEST 1 VIEW COMPARISON:  Chest radiograph performed 10/26/2018 FINDINGS: The lungs are well-aerated. Vascular congestion is noted. Increased interstitial markings raise concern for pulmonary edema. There is no evidence of pleural effusion or pneumothorax. The cardiomediastinal silhouette is borderline enlarged. No acute osseous abnormalities are seen. IMPRESSION: Vascular congestion and borderline cardiomegaly. Increased interstitial markings raise concern for pulmonary edema. Electronically Signed   By: Garald Balding M.D.   On: 11/12/2018 21:31   Dg Chest Port 1 View  Result Date: 10/26/2018 CLINICAL DATA:  Shortness of breath. EXAM:  PORTABLE CHEST 1 VIEW COMPARISON:  10/25/2018 FINDINGS: The cardiac silhouette remains near the upper limit of normal in size. Clear lungs. Mild peribronchial thickening without significant change. Mild thoracic spine degenerative changes. The previously demonstrated elevation of the left hemidiaphragm is no longer demonstrated. IMPRESSION: Stable mild chronic changes. No acute abnormality. Electronically Signed  By: Claudie Revering M.D.   On: 10/26/2018 08:31   Dg Chest Port 1 View  Result Date: 10/25/2018 CLINICAL DATA:  Shortness of breath with exertion. EXAM: PORTABLE CHEST 1 VIEW COMPARISON:  Portable chest dated 10/23/2018 and chest CT dated 10/23/2018. FINDINGS: Normal sized heart. Interval linear density at the left lung base. The remainder of the lungs are clear. Mild peribronchial thickening. Thoracic spine degenerative changes. Mild elevation of the left hemidiaphragm with mild progression. IMPRESSION: 1. Interval linear atelectasis at the left lung base. 2. Mild bronchitic changes. Electronically Signed   By: Claudie Revering M.D.   On: 10/25/2018 13:26   Dg Chest Port 1 View  Result Date: 10/23/2018 CLINICAL DATA:  Shortness of breath. EXAM: PORTABLE CHEST 1 VIEW COMPARISON:  10/02/2018 and prior radiographs FINDINGS: The cardiomediastinal silhouette is unremarkable. Bilateral LOWER lung opacities/airspace disease again noted and may be slightly increased on the RIGHT. No definite pleural effusion or pneumothorax. No acute bony abnormalities are present. IMPRESSION: Bilateral LOWER lung opacities/airspace disease, question slightly increased on the RIGHT. These opacities may represent pneumonia and/or atelectasis. Electronically Signed   By: Margarette Canada M.D.   On: 10/23/2018 20:43   Mm 3d Screen Breast Bilateral  Result Date: 10/20/2018 CLINICAL DATA:  Screening. EXAM: DIGITAL SCREENING BILATERAL MAMMOGRAM WITH TOMO AND CAD COMPARISON:  Previous exam(s). ACR Breast Density Category b: There are  scattered areas of fibroglandular density. FINDINGS: There are no findings suspicious for malignancy. Images were processed with CAD. IMPRESSION: No mammographic evidence of malignancy. A result letter of this screening mammogram will be mailed directly to the patient. RECOMMENDATION: Screening mammogram in one year. (Code:SM-B-01Y) BI-RADS CATEGORY  1: Negative. Electronically Signed   By: Lillia Mountain M.D.   On: 10/20/2018 10:36     Phillips Climes M.D on 11/13/2018 at 12:48 PM  Between 7am to 7pm - Pager - [phone]  After 7pm go to [email or web address] - password [facility]  [facility] -  Office  [phone]

## 2018-11-13 NOTE — ED Notes (Signed)
Pt has been off con't neb for about 30 minutes, clearer breath sounds, pt reports improved sob. Pt has had 600 urine output.

## 2018-11-13 NOTE — Progress Notes (Signed)
   11/13/18 0047  Vitals  Temp 97.6 F (36.4 C)  Temp Source Oral  BP (!) 147/77  MAP (mmHg) 96  BP Location Right Arm  BP Method Automatic  Patient Position (if appropriate) Lying  Pulse Rate (!) 108  Resp 20  Oxygen Therapy  SpO2 94 %  O2 Device Room Air  Pt arrived to 3e26.  Non tele.  Pt states she is not in any pain at this time.

## 2018-11-13 NOTE — Evaluation (Signed)
Physical Therapy Evaluation Patient Details Name: Janijah Symons MRN: [id number] DOB: [birth year] Today's Date: 11/13/2018   History of Present Illness  Pt is a 62 y.o. female admitted with c/o SOB and cough. Dx of acute bronchitis. CT showed atelectasis but no pleural effusion. Prior admission October 25th-27th for pneumonia and COPD exacerbation. PMH includes COPD, HIV, schizophrenia, depression, cancer.   Clinical Impression  Pt presents to physical therapy with decreased muscular endurance, strength and SOB secondary to cardiopulmonary condition exacerbation. Pt demonstrated decreased O2 sat with 10 ft amb on RA to 86%, improved with break and deep breathing to 90%. O2 sat did not improve above 90% in sitting post session on RA, donned supplemental 1L O2 at end of session. 2/4 DOE with amb. Pt should continue with PT to improve walking distance, breathing strategies and safety with amb. Pt edu on importance of sitting upright and short bouts of activity to improve SOB and cough. Skilled intervention is necessary to improve endurance and strength so patient can ambulate independently at home, STS safely and decrease risk of falls. Will follow acutely.    Follow Up Recommendations Home health PT    Equipment Recommendations  None recommended by PT    Recommendations for Other Services       Precautions / Restrictions Precautions Precautions: Fall Precaution Comments: pt denies any recent falls Restrictions Weight Bearing Restrictions: No      Mobility  Bed Mobility Overal bed mobility: Needs Assistance Bed Mobility: Supine to Sit     Supine to sit: Min assist;HOB elevated     General bed mobility comments: assist for trunk to get to EOB with hand hold assist   Transfers Overall transfer level: Needs assistance Equipment used: Straight cane Transfers: Sit to/from Stand Sit to Stand: Min guard;Min assist         General transfer comment: Assistance to power up for  stand. Sitting SpO2 1 L O2 nasal cannula- 96%, Sitting SpO2 RA - 92%. Sitting after amb RA SpO2 90%.   Ambulation/Gait Ambulation/Gait assistance: Min guard Gait Distance (Feet): 60 Feet Assistive device: Straight cane Gait Pattern/deviations: Wide base of support;Decreased stride length;Shuffle Gait velocity: Decreased Gait velocity interpretation: <1.31 ft/sec, indicative of household ambulator General Gait Details: Waddling gait, significant trunk shift bilaterally. On RA. 30 feet SpO2 86%, deep breaths and standing break returned to 90%.   Stairs            Wheelchair Mobility    Modified Rankin (Stroke Patients Only)       Balance Overall balance assessment: Mild deficits observed, not formally tested                                           Pertinent Vitals/Pain Pain Assessment: No/denies pain    Home Living Family/patient expects to be discharged to:: Shelter/Homeless                 Additional Comments: [employer]    Prior Function Level of Independence: Independent with assistive device(s)         Comments: amb with cane     Hand Dominance   Dominant Hand: Right    Extremity/Trunk Assessment   Upper Extremity Assessment Upper Extremity Assessment: Overall WFL for tasks assessed    Lower Extremity Assessment Lower Extremity Assessment: Generalized weakness(observed during functional tasks (STS, amb))    Cervical / Trunk Assessment Cervical /  Trunk Assessment: Normal  Communication   Communication: No difficulties  Cognition Arousal/Alertness: Awake/alert Behavior During Therapy: WFL for tasks assessed/performed Overall Cognitive Status: Within Functional Limits for tasks assessed                                        General Comments General comments (skin integrity, edema, etc.): Swelling in Bilateral LE R>L 1+    Exercises     Assessment/Plan    PT Assessment Patient needs  continued PT services  PT Problem List Decreased strength;Decreased knowledge of use of DME;Decreased activity tolerance;Decreased safety awareness;Decreased balance;Decreased mobility;Cardiopulmonary status limiting activity;Decreased coordination;Impaired sensation       PT Treatment Interventions DME instruction;Balance training;Gait training;Neuromuscular re-education;Stair training;Functional mobility training;Patient/family education;Therapeutic activities;Therapeutic exercise    PT Goals (Current goals can be found in the Care Plan section)  Acute Rehab PT Goals Patient Stated Goal: breathe better, decrease SOB PT Goal Formulation: With patient Time For Goal Achievement: 11/27/18 Potential to Achieve Goals: Good    Frequency Min 3X/week   Barriers to discharge Decreased caregiver support      Co-evaluation               AM-PAC PT "6 Clicks" Daily Activity  Outcome Measure Difficulty turning over in bed (including adjusting bedclothes, sheets and blankets)?: A Little Difficulty moving from lying on back to sitting on the side of the bed? : Unable Difficulty sitting down on and standing up from a chair with arms (e.g., wheelchair, bedside commode, etc,.)?: Unable Help needed moving to and from a bed to chair (including a wheelchair)?: A Little Help needed walking in hospital room?: A Little Help needed climbing 3-5 steps with a railing? : A Lot 6 Click Score: 13    End of Session Equipment Utilized During Treatment: Oxygen;Gait belt Activity Tolerance: Patient tolerated treatment well;Patient limited by fatigue Patient left: with chair alarm set;in chair;with call bell/phone within reach Nurse Communication: Mobility status PT Visit Diagnosis: Unsteadiness on feet (R26.81);Muscle weakness (generalized) (M62.81);Difficulty in walking, not elsewhere classified (R26.2)    Time: 7867-5449 PT Time Calculation (min) (ACUTE ONLY): 20 min   Charges:   PT Evaluation $PT  Eval Moderate Complexity: 1 Mod          Gilda Crease, SPT Acute Rehab Services [place] 11/13/2018, 10:32 AM

## 2018-11-13 NOTE — Evaluation (Signed)
Occupational Therapy Evaluation Patient Details Name: Diane Sutton MRN: [id number] DOB: 03/10/1956 Today's Date: 11/13/2018    History of Present Illness Pt admitted with SOB and cough. PMH: depression, schizophrenia, gastric cancer, HTN, COPD with admission in October for exacerbation.   Clinical Impression   Pt is functioning at a modified independent level in ADL and mobility. Dyspneic with minimal exertion. Educated in energy conservation strategies and breathing techniques with pt verbalizing understanding. No further OT needs.    Follow Up Recommendations  No OT follow up    Equipment Recommendations  None recommended by OT    Recommendations for Other Services       Precautions / Restrictions Precautions Precautions: None Precaution Comments: pt denies any recent falls Restrictions Weight Bearing Restrictions: No      Mobility Bed Mobility               General bed mobility comments: pt received in chair  Transfers Overall transfer level: Modified independent Equipment used: Straight cane                  Balance                                           ADL either performed or assessed with clinical judgement   ADL Overall ADL's : Modified independent                                       General ADL Comments: Educated pt in energy conservation and breathing techniques.      Vision Baseline Vision/History: Wears glasses Wears Glasses: Reading only Patient Visual Report: No change from baseline       Perception     Praxis      Pertinent Vitals/Pain Pain Assessment: No/denies pain     Hand Dominance Right   Extremity/Trunk Assessment Upper Extremity Assessment Upper Extremity Assessment: Overall WFL for tasks assessed   Lower Extremity Assessment Lower Extremity Assessment: Defer to PT evaluation   Cervical / Trunk Assessment Cervical / Trunk Assessment: Normal   Communication  Communication Communication: No difficulties   Cognition Arousal/Alertness: Awake/alert Behavior During Therapy: Flat affect Overall Cognitive Status: Within Functional Limits for tasks assessed                                     General Comments       Exercises     Shoulder Instructions      Home Living Family/patient expects to be discharged to:: Shelter/Homeless                                 Additional Comments: Lives at [facility].      Prior Functioning/Environment Level of Independence: Independent with assistive device(s)        Comments: walks with a cane        OT Problem List:        OT Treatment/Interventions:      OT Goals(Current goals can be found in the care plan section) Acute Rehab OT Goals Patient Stated Goal: to breathe better  OT Frequency:     Barriers to D/C:  Co-evaluation              AM-PAC PT "6 Clicks" Daily Activity     Outcome Measure Help from another person eating meals?: None Help from another person taking care of personal grooming?: None Help from another person toileting, which includes using toliet, bedpan, or urinal?: None Help from another person bathing (including washing, rinsing, drying)?: None Help from another person to put on and taking off regular upper body clothing?: None Help from another person to put on and taking off regular lower body clothing?: None 6 Click Score: 24   End of Session Equipment Utilized During Treatment: Gait belt(cane)  Activity Tolerance: Patient tolerated treatment well Patient left: in chair;with call bell/phone within reach;with chair alarm set;with nursing/sitter in room  OT Visit Diagnosis: Other (comment)(decreased activity tolerance)                Time: 8413-2440 OT Time Calculation (min): 14 min Charges:  OT General Charges $OT Visit: 1 Visit OT Evaluation $OT Eval Low Complexity: 1 Low  Nestor Lewandowsky, OTR/L Acute  Rehabilitation Services Pager: [phone] Office: [phone]  Malka So 11/13/2018, 9:00 AM

## 2018-11-13 NOTE — Progress Notes (Signed)
Nutrition Consult - Brief Note  RD consulted as part of COPD protocol.  Assessed this patient with regard to nutrition status and requirements.   Wt Readings from Last 10 Encounters:  11/13/18 134.7 kg  11/02/18 131.5 kg  10/24/18 135.5 kg  10/02/18 127 kg  06/13/16 129.3 kg  03/31/16 122.5 kg  08/01/15 123.4 kg  09/24/14 124.3 kg  09/10/14 124.6 kg  01/22/14 131.5 kg   Body mass index is 45.14 kg/m. Patient meets criteria for morbid obesity based on current BMI.   Current diet order is heart healthy and patient is consuming approximately 70-100% of meals at this time. Labs and medications reviewed.   No nutrition interventions warranted at this time. If nutrition issues arise, please re-consult RD.    [place], Dietetic Intern [phone]

## 2018-11-13 NOTE — Plan of Care (Signed)

## 2018-11-14 MED ORDER — PREDNISONE 10 MG (21) PO TBPK: ORAL_TABLET | ORAL | 0 refills | Status: DC

## 2018-11-14 MED ORDER — AZITHROMYCIN 500 MG PO TABS: 500.0000 mg | ORAL_TABLET | Freq: Every day | ORAL | 0 refills | Status: DC

## 2018-11-14 NOTE — Progress Notes (Signed)
Per case Diane Sutton, patient is good to be d/c

## 2018-11-14 NOTE — Progress Notes (Signed)
Patient discharged: to shelter with a cab   Via: Wheelchair   Discharge paperwork given: to patient   Reviewed with teach back  IV's disconnected  Belongings given to patient

## 2018-11-14 NOTE — Progress Notes (Signed)
Physical Therapy Treatment Patient Details Name: Diane Sutton MRN: [id number] DOB: [birth year] Today's Date: 11/14/2018    History of Present Illness Pt is a 62 y.o. female admitted with c/o SOB and cough. Dx of acute bronchitis. CT showed atelectasis but no pleural effusion. Prior admission in October for pneumonia and COPD exacerbation. PMH includes COPD, HIV, schizophrenia, depression, cancer.     PT Comments    Pt admitted with above diagnosis. Pt currently with functional limitations due to the deficits listed below (see PT Problem List). Pt was able to ambulate with cane and states she is back to her baseline.  Pt steady overall and is discharging today. Will check back Mon if still here.    Pt will benefit from skilled PT to increase their independence and safety with mobility to allow discharge to the venue listed below.     Follow Up Recommendations  No PT follow up     Equipment Recommendations  None recommended by PT    Recommendations for Other Services       Precautions / Restrictions Precautions Precautions: Fall Precaution Comments: pt denies any recent falls Restrictions Weight Bearing Restrictions: No    Mobility  Bed Mobility               General bed mobility comments: in chair on arrival  Transfers Overall transfer level: Needs assistance Equipment used: Straight cane Transfers: Sit to/from Stand Sit to Stand: Supervision         General transfer comment: No assistance to power up for stand. O2 sat >90% throughout.   Ambulation/Gait Ambulation/Gait assistance: Supervision Gait Distance (Feet): 150 Feet Assistive device: Straight cane Gait Pattern/deviations: Wide base of support;Decreased stride length;Step-through pattern Gait velocity: Decreased Gait velocity interpretation: <1.31 ft/sec, indicative of household ambulator General Gait Details: Waddling gait, significant trunk shift bilaterally however pt is steady.    Stairs              Wheelchair Mobility    Modified Rankin (Stroke Patients Only)       Balance                                            Cognition Arousal/Alertness: Awake/alert Behavior During Therapy: WFL for tasks assessed/performed Overall Cognitive Status: Within Functional Limits for tasks assessed                                        Exercises      General Comments General comments (skin integrity, edema, etc.): Pt reports she is at her baseline.       Pertinent Vitals/Pain Pain Assessment: No/denies pain    Home Living                      Prior Function            PT Goals (current goals can now be found in the care plan section) Acute Rehab PT Goals Patient Stated Goal: breathe better, decrease SOB Progress towards PT goals: Progressing toward goals    Frequency    Min 3X/week      PT Plan Discharge plan needs to be updated    Co-evaluation              AM-PAC PT "6 Clicks" Daily  Activity  Outcome Measure  Difficulty turning over in bed (including adjusting bedclothes, sheets and blankets)?: None Difficulty moving from lying on back to sitting on the side of the bed? : None Difficulty sitting down on and standing up from a chair with arms (e.g., wheelchair, bedside commode, etc,.)?: None Help needed moving to and from a bed to chair (including a wheelchair)?: A Little Help needed walking in hospital room?: A Little Help needed climbing 3-5 steps with a railing? : A Lot 6 Click Score: 20    End of Session Equipment Utilized During Treatment: Gait belt Activity Tolerance: Patient tolerated treatment well Patient left: in chair;with call bell/phone within reach Nurse Communication: Mobility status PT Visit Diagnosis: Unsteadiness on feet (R26.81);Muscle weakness (generalized) (M62.81);Difficulty in walking, not elsewhere classified (R26.2)     Time: 4540-9811 PT Time Calculation (min) (ACUTE  ONLY): 9 min  Charges:  $Gait Training: 8-22 mins                     Ryan Pager:  [phone]  Office:  [place] 11/14/2018, 1:35 PM

## 2018-11-14 NOTE — Progress Notes (Signed)
SATURATION QUALIFICATIONS: (This note is used to comply with regulatory documentation for home oxygen)  Patient Saturations on Room Air at Rest = 93%  Patient Saturations on Room Air while Ambulating = 95%   

## 2018-11-14 NOTE — Discharge Instructions (Signed)
Follow with Primary MD Eston Esters, NP in 7 days   Get CBC, CMP, 2 view Chest X ray checked  by Primary MD next visit.    Activity: As tolerated with Full fall precautions use walker/cane & assistance as needed   Disposition home    Diet: Heart Healthy , with feeding assistance and aspiration precautions.  For Heart failure patients - Check your Weight same time everyday, if you gain over 2 pounds, or you develop in leg swelling, experience more shortness of breath or chest pain, call your Primary MD immediately. Follow Cardiac Low Salt Diet and 1.5 lit/day fluid restriction.   On your next visit with your primary care physician please Get Medicines reviewed and adjusted.   Please request your Prim.MD to go over all Hospital Tests and Procedure/Radiological results at the follow up, please get all Hospital records sent to your Prim MD by signing hospital release before you go home.   If you experience worsening of your admission symptoms, develop shortness of breath, life threatening emergency, suicidal or homicidal thoughts you must seek medical attention immediately by calling 911 or calling your MD immediately  if symptoms less severe.  You Must read complete instructions/literature along with all the possible adverse reactions/side effects for all the Medicines you take and that have been prescribed to you. Take any new Medicines after you have completely understood and accpet all the possible adverse reactions/side effects.   Do not drive, operating heavy machinery, perform activities at heights, swimming or participation in water activities or provide baby sitting services if your were admitted for syncope or siezures until you have seen by Primary MD or a Neurologist and advised to do so again.  Do not drive when taking Pain medications.    Do not take more than prescribed Pain, Sleep and Anxiety Medications  Special Instructions: If you have smoked or chewed Tobacco   in the last 2 yrs please stop smoking, stop any regular Alcohol  and or any Recreational drug use.  Wear Seat belts while driving.   Please note  You were cared for by a hospitalist during your hospital stay. If you have any questions about your discharge medications or the care you received while you were in the hospital after you are discharged, you can call the unit and asked to speak with the hospitalist on call if the hospitalist that took care of you is not available. Once you are discharged, your primary care physician will handle any further medical issues. Please note that NO REFILLS for any discharge medications will be authorized once you are discharged, as it is imperative that you return to your primary care physician (or establish a relationship with a primary care physician if you do not have one) for your aftercare needs so that they can reassess your need for medications and monitor your lab values.

## 2018-11-14 NOTE — Clinical Social Work Note (Signed)
CSW acknowledges consult that patient is from a shelter. Per RN, patient plans to return there today and call a cab for transportation.  CSW signing off. Consult again if any social work needs arise.  Dayton Scrape, [place]

## 2018-11-14 NOTE — Discharge Summary (Signed)
Diane Sutton, is a 62 y.o. female  DOB Mar 17, 1956  MRN [id number].  Admission date:  11/12/2018  Admitting Physician  Etta Quill, DO  Discharge Date:  11/14/2018   Primary MD  Eston Esters, NP  Recommendations for primary care physician for things to follow:  - check CBC, BMP during next visit   Admission Diagnosis  COPD exacerbation ([place]) [J44.1]   Discharge Diagnosis  COPD exacerbation ([place]) [J44.1]    Principal Problem:   Acute bronchitis Active Problems:   Paranoid schizophrenia ([place])   HIV positive but "non-active"   COPD with acute exacerbation ([place])      Past Medical History:  Diagnosis Date  . Depression   . gastric ca   . HIV (human immunodeficiency virus infection) ([place])    positive-non active  . Hypertension   . Schizophrenia [facility])     Past Surgical History:  Procedure Laterality Date  . ABDOMINAL HYSTERECTOMY    . ANKLE SURGERY  2004   orif left  . FINGER SURGERY  2004   i/d lt middle finger  . STRABISMUS SURGERY Left 12/11/2007  . STRABISMUS SURGERY Bilateral 01/22/2014   Procedure: REPAIR STRABISMUS BILATERAL EYES;  Surgeon: Derry Skill, MD;  Location: [facility];  Service: Ophthalmology;  Laterality: Bilateral;  . THYROID SURGERY     needle tx       History of present illness and  Hospital Course:     Kindly see H&P for history of present illness and admission details, please review complete Labs, Consult reports and Test reports for all details in brief  HPI  from the history and physical done on the day of admission 11/12/2018  HPI: Diane Sutton is a 62 y.o. female with medical history significant of HTN, paranoid schizophrenia, ? HIV (und viral load, non-reactive screen, CD4 390 last month, not on HAART).  Patient was admitted from 10/25-10/27 with SOB, wheezing, and what was ultimately believed to be COPD  exacerbation.  Patient presents to the ED today with C/O SOB and cough.  Ongoing for past couple of days, slowly getting worse.  Some improvement with breathing treatments by EMS.  Some mildly worsening BLE edema from baseline.  No fevers nor chills.  Unsure of sick contacts but lives in homeless shelter.   ED Course: CXR with ? pulm edema.  Lung sounds more wheezy than wet though.  Given 40mg  IV lasix x1.  Given prednisone and neb treatments with some improvement.  Desats to 87% with ambulation though so hospitalist asked to admit.  Hospital Course   62 y.o.femalewith medical history significant ofHTN, paranoid schizophrenia, ? HIV (und viral load, non-reactive screen, CD4 390 last month, not on HAART).  Patient presents with cough, shortness of breath, wheezing, treated for acute bronchitis/COPD exacerbation treatment  Acute bronchitis -And with some COPD exacerbation, is on IV Solu-Medrol, no further wheezing today, no further hypoxia, she will be discharged on prednisone taper, as well as it is likely some acute bronchitis picture will be discharged on  azithromycin as as well, no further hypoxia, saturating 95% on exertion, initially she was hypoxic 86% with PT which has resolved currently . -She did receive some Lasix some volume overload and imaging, but she does appear to be euvolemic at time of discharge  Paranoid schizophrenia -Continue with home meds   Does report history of nonactive HIV, but during recent hospital stay,  HIV screen is non-reactive and HIV RNA < 20.   Discharge Condition:  Stable   Follow UP  Follow-up Information    Kelly-Coleman, Monica, NP Follow up in 1 week(s).   Specialty:  Family Medicine Contact information: [place] [address] [phone]             Discharge Instructions  and  Discharge Medications   Discharge Instructions    Discharge instructions   Complete by:  As directed    Follow with Primary MD  Eston Esters, NP in 7 days   Get CBC, CMP, 2 view Chest X ray checked  by Primary MD next visit.    Activity: As tolerated with Full fall precautions use walker/cane & assistance as needed   Disposition home    Diet: Heart Healthy , with feeding assistance and aspiration precautions.  For Heart failure patients - Check your Weight same time everyday, if you gain over 2 pounds, or you develop in leg swelling, experience more shortness of breath or chest pain, call your Primary MD immediately. Follow Cardiac Low Salt Diet and 1.5 lit/day fluid restriction.   On your next visit with your primary care physician please Get Medicines reviewed and adjusted.   Please request your Prim.MD to go over all Hospital Tests and Procedure/Radiological results at the follow up, please get all Hospital records sent to your Prim MD by signing hospital release before you go home.   If you experience worsening of your admission symptoms, develop shortness of breath, life threatening emergency, suicidal or homicidal thoughts you must seek medical attention immediately by calling 911 or calling your MD immediately  if symptoms less severe.  You Must read complete instructions/literature along with all the possible adverse reactions/side effects for all the Medicines you take and that have been prescribed to you. Take any new Medicines after you have completely understood and accpet all the possible adverse reactions/side effects.   Do not drive, operating heavy machinery, perform activities at heights, swimming or participation in water activities or provide baby sitting services if your were admitted for syncope or siezures until you have seen by Primary MD or a Neurologist and advised to do so again.  Do not drive when taking Pain medications.    Do not take more than prescribed Pain, Sleep and Anxiety Medications  Special Instructions: If you have smoked or chewed Tobacco  in the last 2 yrs  please stop smoking, stop any regular Alcohol  and or any Recreational drug use.  Wear Seat belts while driving.   Please note  You were cared for by a hospitalist during your hospital stay. If you have any questions about your discharge medications or the care you received while you were in the hospital after you are discharged, you can call the unit and asked to speak with the hospitalist on call if the hospitalist that took care of you is not available. Once you are discharged, your primary care physician will handle any further medical issues. Please note that NO REFILLS for any discharge medications will be authorized once you are discharged, as  it is imperative that you return to your primary care physician (or establish a relationship with a primary care physician if you do not have one) for your aftercare needs so that they can reassess your need for medications and monitor your lab values.   Increase activity slowly   Complete by:  As directed      Allergies as of 11/14/2018   No Known Allergies     Medication List    STOP taking these medications   ibuprofen 200 MG tablet Commonly known as:  ADVIL,MOTRIN     TAKE these medications   azithromycin 500 MG tablet Commonly known as:  ZITHROMAX Take 1 tablet (500 mg total) by mouth daily. Start taking on:  11/15/2018   escitalopram 20 MG tablet Commonly known as:  LEXAPRO Take 1 tablet (20 mg total) by mouth daily for 14 days.   hydrochlorothiazide 12.5 MG capsule Commonly known as:  MICROZIDE Take 12.5 mg by mouth daily.   ipratropium-albuterol 0.5-2.5 (3) MG/3ML Soln Commonly known as:  DUONEB Take 3 mLs by nebulization every 6 (six) hours as needed. What changed:  reasons to take this   meloxicam 15 MG tablet Commonly known as:  MOBIC Take 15 mg by mouth daily.   nystatin cream Commonly known as:  MYCOSTATIN Apply 1 application topically 2 (two) times daily. Apply to affected area twice daily for 1 week What  changed:  additional instructions   pantoprazole 40 MG tablet Commonly known as:  PROTONIX Take 1 tablet (40 mg total) by mouth daily.   predniSONE 10 MG (21) Tbpk tablet Commonly known as:  STERAPRED UNI-PAK 21 TAB Use per package instruction   PROAIR HFA 108 (90 Base) MCG/ACT inhaler Generic drug:  albuterol Take 1 puff by mouth every 6 (six) hours as needed for shortness of breath.   QUEtiapine 200 MG tablet Commonly known as:  SEROQUEL Take 1 tablet (200 mg total) by mouth at bedtime.         Diet and Activity recommendation: See Discharge Instructions above   Consults obtained -  None   Major procedures and Radiology Reports - PLEASE review detailed and final reports for all details, in brief -     Ct Chest W Contrast  Result Date: 10/23/2018 CLINICAL DATA:  Increased shortness of breath history of pneumonia EXAM: CT CHEST WITH CONTRAST TECHNIQUE: Multidetector CT imaging of the chest was performed during intravenous contrast administration. CONTRAST:  19mL OMNIPAQUE IOHEXOL 300 MG/ML  SOLN COMPARISON:  10/23/2018, 10/02/2018, 02/13/2016 radiographs FINDINGS: Cardiovascular: Nonaneurysmal aorta. Mild aortic atherosclerosis. Soft heart size within normal limits. No pericardial effusion Mediastinum/Nodes: Midline trachea. 2.3 cm heterogeneous right lobe nodule with calcification. No significant mediastinal adenopathy. Esophagus within normal Lungs/Pleura: No focal consolidation or pleural effusion. No pneumothorax. Minimal left lower lobe peribronchial thickening. Upper Abdomen: No acute abnormality. Musculoskeletal: No chest wall abnormality. No acute or significant osseous findings. IMPRESSION: 1. No focal pneumonia 2. Mild left lower lobe peribronchial thickening which could be secondary to airways inflammation/reactive airways 3. 2.3 cm right thyroid nodule which may be evaluated with nonemergent thyroid ultrasound as indicated Aortic Atherosclerosis (ICD10-I70.0).  Electronically Signed   By: Donavan Foil M.D.   On: 10/23/2018 23:03   Ct Abdomen Pelvis W Contrast  Result Date: 11/02/2018 CLINICAL DATA:  Mid abdominal pain EXAM: CT ABDOMEN AND PELVIS WITH CONTRAST TECHNIQUE: Multidetector CT imaging of the abdomen and pelvis was performed using the standard protocol following bolus administration of intravenous contrast. CONTRAST:  197mL ISOVUE-300  IOPAMIDOL (ISOVUE-300) INJECTION 61% COMPARISON:  None FINDINGS: Lower chest: Scar and/or subsegmental atelectasis within the lingula and posterior left lower lobe. No pleural fluid. Hepatobiliary: No focal liver abnormality is seen. No gallstones, gallbladder wall thickening, or biliary dilatation. Pancreas: Unremarkable. No pancreatic ductal dilatation or surrounding inflammatory changes. Spleen: Normal in size without focal abnormality. Adrenals/Urinary Tract: Normal adrenal glands. The kidneys are unremarkable. The urinary bladder appears within normal limits. Stomach/Bowel: Stomach is within normal limits. Appendix appears normal. No evidence of bowel wall thickening, distention, or inflammatory changes. Vascular/Lymphatic: No significant vascular findings are present. No enlarged abdominal or pelvic lymph nodes. Reproductive: Status post hysterectomy. No adnexal masses. Other: No abdominal wall hernia or abnormality. No abdominopelvic ascites. Musculoskeletal: Spondylosis noted within the lumbar spine. IMPRESSION: 1. No acute findings within the abdomen or pelvis. Electronically Signed   By: Kerby Moors M.D.   On: 11/02/2018 01:20   Dg Chest Port 1 View  Result Date: 11/12/2018 CLINICAL DATA:  Acute onset of shortness of breath. EXAM: PORTABLE CHEST 1 VIEW COMPARISON:  Chest radiograph performed 10/26/2018 FINDINGS: The lungs are well-aerated. Vascular congestion is noted. Increased interstitial markings raise concern for pulmonary edema. There is no evidence of pleural effusion or pneumothorax. The  cardiomediastinal silhouette is borderline enlarged. No acute osseous abnormalities are seen. IMPRESSION: Vascular congestion and borderline cardiomegaly. Increased interstitial markings raise concern for pulmonary edema. Electronically Signed   By: Garald Balding M.D.   On: 11/12/2018 21:31   Dg Chest Port 1 View  Result Date: 10/26/2018 CLINICAL DATA:  Shortness of breath. EXAM: PORTABLE CHEST 1 VIEW COMPARISON:  10/25/2018 FINDINGS: The cardiac silhouette remains near the upper limit of normal in size. Clear lungs. Mild peribronchial thickening without significant change. Mild thoracic spine degenerative changes. The previously demonstrated elevation of the left hemidiaphragm is no longer demonstrated. IMPRESSION: Stable mild chronic changes. No acute abnormality. Electronically Signed   By: Claudie Revering M.D.   On: 10/26/2018 08:31   Dg Chest Port 1 View  Result Date: 10/25/2018 CLINICAL DATA:  Shortness of breath with exertion. EXAM: PORTABLE CHEST 1 VIEW COMPARISON:  Portable chest dated 10/23/2018 and chest CT dated 10/23/2018. FINDINGS: Normal sized heart. Interval linear density at the left lung base. The remainder of the lungs are clear. Mild peribronchial thickening. Thoracic spine degenerative changes. Mild elevation of the left hemidiaphragm with mild progression. IMPRESSION: 1. Interval linear atelectasis at the left lung base. 2. Mild bronchitic changes. Electronically Signed   By: Claudie Revering M.D.   On: 10/25/2018 13:26   Dg Chest Port 1 View  Result Date: 10/23/2018 CLINICAL DATA:  Shortness of breath. EXAM: PORTABLE CHEST 1 VIEW COMPARISON:  10/02/2018 and prior radiographs FINDINGS: The cardiomediastinal silhouette is unremarkable. Bilateral LOWER lung opacities/airspace disease again noted and may be slightly increased on the RIGHT. No definite pleural effusion or pneumothorax. No acute bony abnormalities are present. IMPRESSION: Bilateral LOWER lung opacities/airspace disease,  question slightly increased on the RIGHT. These opacities may represent pneumonia and/or atelectasis. Electronically Signed   By: Margarette Canada M.D.   On: 10/23/2018 20:43   Mm 3d Screen Breast Bilateral  Result Date: 10/20/2018 CLINICAL DATA:  Screening. EXAM: DIGITAL SCREENING BILATERAL MAMMOGRAM WITH TOMO AND CAD COMPARISON:  Previous exam(s). ACR Breast Density Category b: There are scattered areas of fibroglandular density. FINDINGS: There are no findings suspicious for malignancy. Images were processed with CAD. IMPRESSION: No mammographic evidence of malignancy. A result letter of this screening mammogram will be mailed directly  to the patient. RECOMMENDATION: Screening mammogram in one year. (Code:SM-B-01Y) BI-RADS CATEGORY  1: Negative. Electronically Signed   By: Lillia Mountain M.D.   On: 10/20/2018 10:36    Micro Results    No results found for this or any previous visit (from the past 240 hour(s)).     Today   Subjective:   Aashvi Rezabek today has no headache,no chest abdominal pain,no new weakness tingling or numbness, feels much better wants to go home today.   Objective:   Blood pressure (!) 138/95, pulse 84, temperature 98.1 F (36.7 C), temperature source Oral, resp. rate 20, height 5\' 8"  (1.727 m), weight 133.9 kg, SpO2 96 %.   Intake/Output Summary (Last 24 hours) at 11/14/2018 1258 Last data filed at 11/14/2018 0933 Gross per 24 hour  Intake 1080 ml  Output 1500 ml  Net -420 ml    Exam Awake Alert, Oriented x 3, No new F.N deficits, Normal affect Symmetrical Chest wall movement, Good air movement bilaterally, CTAB RRR,No Gallops,Rubs or new Murmurs, No Parasternal Heave +ve B.Sounds, Abd Soft, Non tender,  No rebound -guarding or rigidity. No Cyanosis, Clubbing or edema, No new Rash or bruise  Data Review   CBC w Diff:  Lab Results  Component Value Date   WBC 7.4 11/12/2018   HGB 12.0 11/12/2018   HCT 39.9 11/12/2018   PLT 255 11/12/2018    LYMPHOPCT 35 11/12/2018   MONOPCT 9 11/12/2018   EOSPCT 7 11/12/2018   BASOPCT 0 11/12/2018    CMP:  Lab Results  Component Value Date   NA 141 11/12/2018   K 3.8 11/12/2018   CL 105 11/12/2018   CO2 30 11/12/2018   BUN 18 11/12/2018   CREATININE 0.76 11/12/2018   PROT 7.2 11/02/2018   ALBUMIN 3.3 (L) 11/02/2018   BILITOT 0.5 11/02/2018   ALKPHOS 70 11/02/2018   AST 15 11/02/2018   ALT 24 11/02/2018  .   Total Time in preparing paper work, data evaluation and todays exam - 30 minutes  Phillips Climes M.D on 11/14/2018 at 12:58 PM  [facility]   Office  [phone]

## 2018-12-01 ENCOUNTER — Other Ambulatory Visit: Payer: Self-pay

## 2018-12-01 ENCOUNTER — Emergency Department (HOSPITAL_COMMUNITY): Payer: Medicaid Other

## 2018-12-01 ENCOUNTER — Encounter (HOSPITAL_COMMUNITY): Payer: Self-pay | Admitting: Emergency Medicine

## 2018-12-01 ENCOUNTER — Inpatient Hospital Stay (HOSPITAL_COMMUNITY)
Admission: EM | Admit: 2018-12-01 | Discharge: 2018-12-04 | DRG: 190 | Disposition: A | Discharge status: Home / Self Care | Payer: Medicaid Other | Attending: Internal Medicine | Admitting: Internal Medicine

## 2018-12-01 DIAGNOSIS — J440 Chronic obstructive pulmonary disease with acute lower respiratory infection: Secondary | ICD-10-CM | POA: Diagnosis present

## 2018-12-01 DIAGNOSIS — F200 Paranoid schizophrenia: Secondary | ICD-10-CM | POA: Diagnosis present

## 2018-12-01 DIAGNOSIS — J9601 Acute respiratory failure with hypoxia: Secondary | ICD-10-CM | POA: Diagnosis present

## 2018-12-01 DIAGNOSIS — R52 Pain, unspecified: Secondary | ICD-10-CM

## 2018-12-01 DIAGNOSIS — J209 Acute bronchitis, unspecified: Secondary | ICD-10-CM | POA: Diagnosis present

## 2018-12-01 DIAGNOSIS — Z791 Long term (current) use of non-steroidal anti-inflammatories (NSAID): Secondary | ICD-10-CM

## 2018-12-01 DIAGNOSIS — R9431 Abnormal electrocardiogram [ECG] [EKG]: Secondary | ICD-10-CM | POA: Diagnosis not present

## 2018-12-01 DIAGNOSIS — Z79899 Other long term (current) drug therapy: Secondary | ICD-10-CM

## 2018-12-01 DIAGNOSIS — Z87891 Personal history of nicotine dependence: Secondary | ICD-10-CM

## 2018-12-01 DIAGNOSIS — J441 Chronic obstructive pulmonary disease with (acute) exacerbation: Secondary | ICD-10-CM

## 2018-12-01 DIAGNOSIS — J40 Bronchitis, not specified as acute or chronic: Secondary | ICD-10-CM | POA: Diagnosis present

## 2018-12-01 DIAGNOSIS — I110 Hypertensive heart disease with heart failure: Secondary | ICD-10-CM | POA: Diagnosis present

## 2018-12-01 DIAGNOSIS — Z85028 Personal history of other malignant neoplasm of stomach: Secondary | ICD-10-CM

## 2018-12-01 DIAGNOSIS — I509 Heart failure, unspecified: Secondary | ICD-10-CM | POA: Diagnosis present

## 2018-12-01 DIAGNOSIS — Z21 Asymptomatic human immunodeficiency virus [HIV] infection status: Secondary | ICD-10-CM

## 2018-12-01 DIAGNOSIS — Z6841 Body Mass Index (BMI) 40.0 and over, adult: Secondary | ICD-10-CM

## 2018-12-01 LAB — CBC WITH DIFFERENTIAL/PLATELET
ABS IMMATURE GRANULOCYTES: 0.01 10*3/uL (ref 0.00–0.07)
BASOS PCT: 1 %
Basophils Absolute: 0.1 10*3/uL (ref 0.0–0.1)
EOS PCT: 10 %
Eosinophils Absolute: 0.6 10*3/uL — ABNORMAL HIGH (ref 0.0–0.5)
HCT: 41.3 % (ref 36.0–46.0)
HEMOGLOBIN: 12.6 g/dL (ref 12.0–15.0)
Immature Granulocytes: 0 %
Lymphocytes Relative: 37 %
Lymphs Abs: 2.5 10*3/uL (ref 0.7–4.0)
MCH: 29.9 pg (ref 26.0–34.0)
MCHC: 30.5 g/dL (ref 30.0–36.0)
MCV: 98.1 fL (ref 80.0–100.0)
MONOS PCT: 8 %
Monocytes Absolute: 0.6 10*3/uL (ref 0.1–1.0)
NEUTROS ABS: 3 10*3/uL (ref 1.7–7.7)
Neutrophils Relative %: 44 %
PLATELETS: 277 10*3/uL (ref 150–400)
RBC: 4.21 MIL/uL (ref 3.87–5.11)
RDW: 13.3 % (ref 11.5–15.5)
WBC: 6.7 10*3/uL (ref 4.0–10.5)
nRBC: 0 % (ref 0.0–0.2)

## 2018-12-01 LAB — RESPIRATORY PANEL BY PCR
Adenovirus: NOT DETECTED
BORDETELLA PERTUSSIS-RVPCR: NOT DETECTED
CHLAMYDOPHILA PNEUMONIAE-RVPPCR: NOT DETECTED
Coronavirus 229E: NOT DETECTED
Coronavirus HKU1: NOT DETECTED
Coronavirus NL63: NOT DETECTED
Coronavirus OC43: NOT DETECTED
INFLUENZA B-RVPPCR: NOT DETECTED
Influenza A: NOT DETECTED
Metapneumovirus: NOT DETECTED
Mycoplasma pneumoniae: NOT DETECTED
Parainfluenza Virus 1: NOT DETECTED
Parainfluenza Virus 2: NOT DETECTED
Parainfluenza Virus 3: NOT DETECTED
Parainfluenza Virus 4: NOT DETECTED
Respiratory Syncytial Virus: NOT DETECTED
Rhinovirus / Enterovirus: NOT DETECTED

## 2018-12-01 LAB — BASIC METABOLIC PANEL
ANION GAP: 7 (ref 5–15)
BUN: 18 mg/dL (ref 8–23)
CO2: 30 mmol/L (ref 22–32)
CREATININE: 0.77 mg/dL (ref 0.44–1.00)
Calcium: 8.6 mg/dL — ABNORMAL LOW (ref 8.9–10.3)
Chloride: 101 mmol/L (ref 98–111)
GFR calc Af Amer: >60 mL/min (ref >60)
GLUCOSE: 105 mg/dL — AB (ref 70–99)
POTASSIUM: 3.7 mmol/L (ref 3.5–5.1)
Sodium: 138 mmol/L (ref 135–145)

## 2018-12-01 LAB — TROPONIN I

## 2018-12-01 LAB — I-STAT TROPONIN, ED: TROPONIN I, POC: 0 ng/mL (ref 0.00–0.08)

## 2018-12-01 LAB — BRAIN NATRIURETIC PEPTIDE: B Natriuretic Peptide: 12.8 pg/mL (ref 0.0–100.0)

## 2018-12-01 MED ORDER — ACETAMINOPHEN 325 MG PO TABS
650.0000 mg | ORAL_TABLET | Freq: Four times a day (QID) | ORAL | Status: DC | PRN
  Administered 2018-12-02: 650 mg via ORAL
  Filled 2018-12-01: qty 2

## 2018-12-01 MED ORDER — HYDROCHLOROTHIAZIDE 12.5 MG PO CAPS
12.5000 mg | ORAL_CAPSULE | Freq: Every day | ORAL | Status: DC
  Administered 2018-12-02 – 2018-12-04 (×3): 12.5 mg via ORAL
  Filled 2018-12-01 (×3): qty 1

## 2018-12-01 MED ORDER — IPRATROPIUM BROMIDE 0.02 % IN SOLN
0.5000 mg | Freq: Once | RESPIRATORY_TRACT | Status: AC
  Administered 2018-12-01: 0.5 mg via RESPIRATORY_TRACT
  Filled 2018-12-01: qty 2.5

## 2018-12-01 MED ORDER — BENZONATATE 100 MG PO CAPS
200.0000 mg | ORAL_CAPSULE | Freq: Two times a day (BID) | ORAL | Status: DC | PRN
  Administered 2018-12-02: 200 mg via ORAL
  Filled 2018-12-01: qty 2

## 2018-12-01 MED ORDER — ESCITALOPRAM OXALATE 20 MG PO TABS
20.0000 mg | ORAL_TABLET | Freq: Every day | ORAL | Status: DC
  Administered 2018-12-02 – 2018-12-04 (×3): 20 mg via ORAL
  Filled 2018-12-01 (×3): qty 1

## 2018-12-01 MED ORDER — BENZONATATE 100 MG PO CAPS
200.0000 mg | ORAL_CAPSULE | Freq: Once | ORAL | Status: AC
  Administered 2018-12-01: 200 mg via ORAL
  Filled 2018-12-01: qty 2

## 2018-12-01 MED ORDER — ALBUTEROL (5 MG/ML) CONTINUOUS INHALATION SOLN
10.0000 mg/h | INHALATION_SOLUTION | RESPIRATORY_TRACT | Status: DC
  Administered 2018-12-01: 10 mg/h via RESPIRATORY_TRACT
  Filled 2018-12-01: qty 20

## 2018-12-01 MED ORDER — IPRATROPIUM-ALBUTEROL 0.5-2.5 (3) MG/3ML IN SOLN
3.0000 mL | Freq: Once | RESPIRATORY_TRACT | Status: AC
  Administered 2018-12-01: 3 mL via RESPIRATORY_TRACT
  Filled 2018-12-01: qty 3

## 2018-12-01 MED ORDER — NYSTATIN 100000 UNIT/GM EX CREA
1.0000 "application " | TOPICAL_CREAM | Freq: Two times a day (BID) | CUTANEOUS | Status: DC
  Administered 2018-12-01 – 2018-12-04 (×5): 1 via TOPICAL
  Filled 2018-12-01 (×2): qty 15

## 2018-12-01 MED ORDER — ONDANSETRON HCL 4 MG/2ML IJ SOLN: 4.0000 mg | Freq: Four times a day (QID) | INTRAMUSCULAR | Status: DC | PRN

## 2018-12-01 MED ORDER — GUAIFENESIN ER 600 MG PO TB12
600.0000 mg | ORAL_TABLET | Freq: Two times a day (BID) | ORAL | Status: DC
  Administered 2018-12-01 – 2018-12-04 (×6): 600 mg via ORAL
  Filled 2018-12-01 (×6): qty 1

## 2018-12-01 MED ORDER — PANTOPRAZOLE SODIUM 40 MG PO TBEC
40.0000 mg | DELAYED_RELEASE_TABLET | Freq: Every day | ORAL | Status: DC
  Administered 2018-12-02 – 2018-12-04 (×3): 40 mg via ORAL
  Filled 2018-12-01 (×3): qty 1

## 2018-12-01 MED ORDER — METHYLPREDNISOLONE SODIUM SUCC 125 MG IJ SOLR
125.0000 mg | Freq: Once | INTRAMUSCULAR | Status: AC
  Administered 2018-12-01: 125 mg via INTRAVENOUS
  Filled 2018-12-01: qty 2

## 2018-12-01 MED ORDER — ENOXAPARIN SODIUM 40 MG/0.4ML ~~LOC~~ SOLN
40.0000 mg | SUBCUTANEOUS | Status: DC
  Administered 2018-12-01 – 2018-12-03 (×3): 40 mg via SUBCUTANEOUS
  Filled 2018-12-01 (×3): qty 0.4

## 2018-12-01 MED ORDER — ONDANSETRON HCL 4 MG PO TABS: 4.0000 mg | ORAL_TABLET | Freq: Four times a day (QID) | ORAL | Status: DC | PRN

## 2018-12-01 MED ORDER — QUETIAPINE FUMARATE 25 MG PO TABS
200.0000 mg | ORAL_TABLET | Freq: Every day | ORAL | Status: DC
  Administered 2018-12-01 – 2018-12-03 (×3): 200 mg via ORAL
  Filled 2018-12-01: qty 1
  Filled 2018-12-01: qty 8
  Filled 2018-12-01: qty 1
  Filled 2018-12-01: qty 8

## 2018-12-01 MED ORDER — DOXYCYCLINE HYCLATE 100 MG PO TABS
100.0000 mg | ORAL_TABLET | Freq: Once | ORAL | Status: AC
  Administered 2018-12-01: 100 mg via ORAL
  Filled 2018-12-01: qty 1

## 2018-12-01 MED ORDER — IPRATROPIUM-ALBUTEROL 0.5-2.5 (3) MG/3ML IN SOLN
3.0000 mL | Freq: Four times a day (QID) | RESPIRATORY_TRACT | Status: DC
  Administered 2018-12-01 – 2018-12-04 (×10): 3 mL via RESPIRATORY_TRACT
  Filled 2018-12-01 (×10): qty 3

## 2018-12-01 MED ORDER — SODIUM CHLORIDE 0.9% FLUSH
3.0000 mL | Freq: Two times a day (BID) | INTRAVENOUS | Status: DC
  Administered 2018-12-01 – 2018-12-03 (×4): 3 mL via INTRAVENOUS

## 2018-12-01 MED ORDER — DOXYCYCLINE HYCLATE 100 MG PO TABS
100.0000 mg | ORAL_TABLET | Freq: Two times a day (BID) | ORAL | Status: DC
  Administered 2018-12-02 – 2018-12-04 (×5): 100 mg via ORAL
  Filled 2018-12-01 (×5): qty 1

## 2018-12-01 MED ORDER — PREDNISONE 20 MG PO TABS
40.0000 mg | ORAL_TABLET | Freq: Every day | ORAL | Status: DC
  Administered 2018-12-02: 40 mg via ORAL
  Filled 2018-12-01: qty 2

## 2018-12-01 MED ORDER — ORAL CARE MOUTH RINSE
15.0000 mL | Freq: Two times a day (BID) | OROMUCOSAL | Status: DC
  Administered 2018-12-03 – 2018-12-04 (×2): 15 mL via OROMUCOSAL

## 2018-12-01 MED ORDER — ALBUTEROL SULFATE (2.5 MG/3ML) 0.083% IN NEBU
5.0000 mg | INHALATION_SOLUTION | Freq: Once | RESPIRATORY_TRACT | Status: AC
  Administered 2018-12-01: 5 mg via RESPIRATORY_TRACT
  Filled 2018-12-01: qty 6

## 2018-12-01 MED ORDER — ACETAMINOPHEN 650 MG RE SUPP: 650.0000 mg | Freq: Four times a day (QID) | RECTAL | Status: DC | PRN

## 2018-12-01 NOTE — ED Triage Notes (Signed)
Pt arrives to ED from home with complaints of shortness of breath and wheezing starting yesterday that has progressively gotten worse. Pt is labored breathing on arrival with wheezing throughout lung bases. Pt placed in position of comfort with bed locked and lowered, call bell in reach.

## 2018-12-01 NOTE — ED Notes (Signed)
Ref. To influenza panel - no test kits available and on back order.

## 2018-12-01 NOTE — ED Provider Notes (Signed)
[facility] EMERGENCY DEPARTMENT Provider Note   CSN: [id number] Arrival date & time: 12/01/18  1335     History   Chief Complaint Chief Complaint  Patient presents with  . Shortness of Breath    HPI Diane Sutton is a 62 y.o. female.  Diane Sutton is a 62 y.o. Female with a history of COPD, hypertension, CHF, HIV, and schizophrenia, who presents to the emergency department who presents for evaluation of worsening shortness of breath and wheezing.  Patient reports symptoms started last night and have progressively worsened.  She reports cough is primarily dry, but she has had increasing difficulty catching her breath, the symptoms are present at rest and with exertion.  She reports she feels very wheezy and tight like she cannot get a deep breath in.  Patient denies any fevers.  No associated chest pain, she is had some mild swelling in her lower extremities but this is not significantly worse than her norm.  No lightheadedness or syncope.  No abdominal pain, nausea or vomiting, no diaphoresis.  Has been using her nebulizers at home without improvement has not tried anything for cough.  Patient with similar presentation earlier in November requiring admission for worsening COPD exacerbation.     Past Medical History:  Diagnosis Date  . Depression   . gastric ca   . HIV (human immunodeficiency virus infection) ([place])    positive-non active  . Hypertension   . Schizophrenia [facility])     Patient Active Problem List   Diagnosis Date Noted  . COPD with acute exacerbation ([place]) 11/13/2018  . Acute bronchitis 11/12/2018  . Acute respiratory failure ([place]) 10/24/2018  . HIV positive but "non-active"   . Paranoid schizophrenia ([place]) 02/05/2011  . SKIN RASH 10/04/2010  . ALLERGIC RHINITIS 05/02/2010  . CANDIDIASIS 04/05/2010  . THYROID NODULE, RIGHT 10/07/2009  . PNEUMONIA 09/23/2009  . CERVICAL RADICULOPATHY, LEFT 09/12/2009  . UTI 08/10/2009  . CHEST PAIN  UNSPECIFIED 08/10/2009  . 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 INGROWING NAIL 10/05/2008  . PRURITUS, EARS 06/08/2008  . OTALGIA 06/02/2008  . VAGINITIS, BACTERIAL 03/31/2008  . ARTHRITIS, RIGHT KNEE 03/23/2008  . OTHER ABNORMAL BLOOD CHEMISTRY 01/29/2008  . GOITER, MULTINODULAR 08/29/2007  . HYPERTHYROIDISM 08/29/2007  . DEPRESSION 08/29/2007  . MIGRAINE, CHRONIC 08/29/2007  . STRABISMUS 08/29/2007  . AUDITORY HALLUCINATION 08/29/2007    Past Surgical History:  Procedure Laterality Date  . ABDOMINAL HYSTERECTOMY    . ANKLE SURGERY  2004   orif left  . FINGER SURGERY  2004   i/d lt middle finger  . STRABISMUS SURGERY Left 12/11/2007  . STRABISMUS SURGERY Bilateral 01/22/2014   Procedure: REPAIR STRABISMUS BILATERAL EYES;  Surgeon: Derry Skill, MD;  Location: [facility];  Service: Ophthalmology;  Laterality: Bilateral;  . THYROID SURGERY     needle tx     OB History   None      Home Medications    Prior to Admission medications   Medication Sig Start Date End Date Taking? Authorizing Provider  azithromycin (ZITHROMAX) 500 MG tablet Take 1 tablet (500 mg total) by mouth daily. 11/15/18   Elgergawy, Silver Huguenin, MD  escitalopram (LEXAPRO) 20 MG tablet Take 1 tablet (20 mg total) by mouth daily for 14 days. 05/17/18 11/12/18  Patrecia Pour, NP  hydrochlorothiazide (MICROZIDE) 12.5 MG capsule Take 12.5 mg by mouth daily.  04/04/18   [provider]  ipratropium-albuterol (DUONEB) 0.5-2.5 (3) MG/3ML SOLN Take 3 mLs by nebulization every  6 (six) hours as needed. Patient taking differently: Take 3 mLs by nebulization every 6 (six) hours as needed (wheezing).  10/26/18   Raiford Noble Latif, DO  meloxicam (MOBIC) 15 MG tablet Take 15 mg by mouth daily.    [provider]  nystatin cream (MYCOSTATIN) Apply 1 application topically 2 (two) times daily. Apply to affected area twice daily for 1 week Patient taking differently: Apply 1 application  topically 2 (two) times daily.  05/04/18   Jacqlyn Larsen, PA-C  pantoprazole (PROTONIX) 40 MG tablet Take 1 tablet (40 mg total) by mouth daily. 11/02/18   Orpah Greek, MD  predniSONE (STERAPRED UNI-PAK 21 TAB) 10 MG (21) TBPK tablet Use per package instruction 11/14/18   Elgergawy, Silver Huguenin, MD  PROAIR HFA 108 [phone] Base) MCG/ACT inhaler Take 1 puff by mouth every 6 (six) hours as needed for shortness of breath. 10/08/18   [provider]  QUEtiapine (SEROQUEL) 200 MG tablet Take 1 tablet (200 mg total) by mouth at bedtime. 05/17/18   Patrecia Pour, NP    Family History Family History  Problem Relation Age of Onset  . Heart disease Mother   . Colon cancer Neg Hx     Social History Social History   Tobacco Use  . Smoking status: Never Smoker  . Smokeless tobacco: Former Network engineer Use Topics  . Alcohol use: No  . Drug use: No     Allergies   Patient has no known allergies.   Review of Systems Review of Systems  Constitutional: Negative for chills and fever.  HENT: Negative for congestion, rhinorrhea and sore throat.   Eyes: Negative for visual disturbance.  Respiratory: Positive for cough, chest tightness, shortness of breath and wheezing.   Cardiovascular: Positive for leg swelling. Negative for chest pain and palpitations.  Gastrointestinal: Negative for abdominal pain, nausea and vomiting.  Genitourinary: Negative for dysuria and frequency.  Musculoskeletal: Negative for arthralgias and myalgias.  Skin: Negative for color change and rash.  Neurological: Negative for dizziness, weakness, light-headedness and numbness.     Physical Exam Updated Vital Signs BP (!) 145/71 (BP Location: Right Arm)   Pulse 85   Temp 97.9 F (36.6 C) (Oral)   Resp (!) 28   Ht 5\' 8"  (1.727 m)   Wt 133 kg   SpO2 96%   BMI 44.58 kg/m   Physical Exam  Constitutional: She is oriented to person, place, and time. She appears well-developed and well-nourished. She  appears ill. No distress.  HENT:  Head: Normocephalic and atraumatic.  Mouth/Throat: Oropharynx is clear and moist.  Eyes: Right eye exhibits no discharge. Left eye exhibits no discharge.  Neck: Neck supple. No JVD present. No tracheal deviation present.  Cardiovascular: Normal rate, regular rhythm, normal heart sounds and intact distal pulses. Exam reveals no gallop and no friction rub.  No murmur heard. Pulmonary/Chest: Tachypnea noted. She has decreased breath sounds. She has wheezes.  Patient with increased respiratory effort, to tachypneic and able to speak only in short sentences due to shortness of breath.  Audible wheezing, on auscultation lungs with significantly decreased breath sounds bilaterally and diffuse wheezing throughout.  Abdominal: Soft. Bowel sounds are normal. She exhibits no distension and no mass. There is no tenderness. There is no guarding.  Respirations equal and unlabored, patient able to speak in full sentences, lungs clear to auscultation bilaterally  Musculoskeletal:  Bilateral lower extremities with mild edema up to the level of the mid shin  Neurological: She is alert and oriented to person, place, and time. Coordination normal.  Skin: Skin is warm and dry. Capillary refill takes less than 2 seconds. She is not diaphoretic.  Psychiatric: She has a normal mood and affect. Her behavior is normal.  Nursing note and vitals reviewed.    ED Treatments / Results  Labs (all labs ordered are listed, but only abnormal results are displayed) Labs Reviewed  BASIC METABOLIC PANEL - Abnormal; Notable for the following components:      Result Value   Glucose, Bld 105 (*)    Calcium 8.6 (*)    All other components within normal limits  CBC WITH DIFFERENTIAL/PLATELET - Abnormal; Notable for the following components:   Eosinophils Absolute 0.6 (*)    All other components within normal limits  BRAIN NATRIURETIC PEPTIDE  I-STAT TROPONIN, ED    EKG EKG  Interpretation  Date/Time:  Monday December 01 2018 13:44:54 EST Ventricular Rate:  86 PR Interval:    QRS Duration: 103 QT Interval:  416 QTC Calculation: 495 R Axis:   74 Text Interpretation:  Sinus rhythm Low voltage, precordial leads Borderline repolarization abnormality Borderline prolonged QT interval No significant change since last tracing Confirmed by Orlie Dakin [phone]) on 12/01/2018 1:49:18 PM   Radiology No results found.  Procedures Procedures (including critical care time)  Medications Ordered in ED Medications  albuterol (PROVENTIL,VENTOLIN) solution continuous neb (10 mg/hr Nebulization New Bag/Given 12/01/18 1643)  albuterol (PROVENTIL) (2.5 MG/3ML) 0.083% nebulizer solution 5 mg (5 mg Nebulization Given 12/01/18 1347)  ipratropium-albuterol (DUONEB) 0.5-2.5 (3) MG/3ML nebulizer solution 3 mL (3 mLs Nebulization Given 12/01/18 1409)  methylPREDNISolone sodium succinate (SOLU-MEDROL) 125 mg/2 mL injection 125 mg (125 mg Intravenous Given 12/01/18 1409)  benzonatate (TESSALON) capsule 200 mg (200 mg Oral Given 12/01/18 1409)     Initial Impression / Assessment and Plan / ED Course  I have reviewed the triage vital signs and the nursing notes.  Pertinent labs & imaging results that were available during my care of the patient were reviewed by me and considered in my medical decision making (see chart for details).  Patient presents to the emergency department for evaluation of severe and worsening shortness of breath with associated cough that started last night.  History of COPD and CHF.  Reports this feels more like her COPD exacerbation.  She has not had any fevers and cough is nonproductive.  No lower extremity swelling which is not significantly worse than usual.  She is been using her nebulizers without improvement.  On initial evaluation patient tachypneic with increased respiratory effort only able to speak in short sentences, lungs are extremely tight on  auscultation with wheezes throughout.  Will give breathing treatments, Solu-Medrol, and get basic lab work, troponin, BNP, EKG and chest x-ray.  Initial lab work is reassuring, no leukocytosis and normal hemoglobin, no acute electrolyte derangements and normal renal function.  Initial troponin is negative and EKG without concerning ischemic changes.  BNP and chest x-ray are pending.  After first 2 nebulizer treatments patient has had some improvement in her shortness of breath and work of breathing but is still significantly wheezy and tight, will give 1 hour of continuous albuterol nebs.  At shift change care signed out to [facility] who will follow up on pending lab work and reevaluate patient after hour of continuous nebs, I suspect patient will require admission for further treatment.  Final Clinical Impressions(s) / ED Diagnoses   Final diagnoses:  COPD exacerbation (  [facility])    ED Discharge Orders    None       Jacqlyn Larsen, [place] 12/01/18 1650    Orlie Dakin, MD 12/01/18 1755

## 2018-12-01 NOTE — H&P (Signed)
History and Physical    Amahia Sutton Sutton:[id number] DOB: 01-Sep-1956 DOA: 12/01/2018  Referring MD/NP/PA: Coral Ceo, PA-C PCP: Eston Esters, NP  Patient coming from: [place]  Chief Complaint: Cough  I have personally briefly reviewed patient's old medical records in [place]   HPI: Diane Sutton is a 62 y.o. female with medical history significant of illness of HTN, schizophrenia, remote tobacco use, and gastric cancer; who presents with complaints of cough and progressively worsening shortness of breath over the last 2 days.  Patient complains of a productive cough with white to yellowish sputum production. Having coughing spells only she was unable to catch her breath.  She tried utilizing Robitussin and Hall's cough drops without relief of symptoms.  She notes associated symptoms of subjective fever, sweats, right-sided sore throat, left ear pain, nausea, right sided chest pain, vomiting x2, leg swelling, and recent sick contacts.  She notes that her sister has been under the weather and people at the [place] where she stays.  Patient does not know if anyone had the flu.  Patient had similar symptoms for which she was admitted to the hospital on 11/13-11/15, for hypercapnic respiratory failure requiring short period on BiPAP improved after IV steroids and empiric antibiotics.  Patient denies currently smoking cigarettes.  ED Course: Upon admission into the emergency department the patient was noted to be afebrile, respirations 15-28, O2 saturations 92-100% on 2 L, and all other vitals stable.  Patient was never noted to be hypoxic.  Lab work was unremarkable including troponin of 0 BNP 12.8.  Chest x-ray showed low lung volumes.  Influenza screen was ordered.  Patient was given continuous and treatment, DuoNeb, doxycycline 100mg  po, and 125 mg of Solu-Medrol.  TRH called to admit for suspected COPD exacerbation.   Review of Systems  Constitutional:  Positive for diaphoresis, fever and malaise/fatigue.  HENT: Positive for congestion and ear pain.   Eyes: Negative for pain and discharge.  Respiratory: Positive for cough, sputum production, shortness of breath and wheezing. Negative for hemoptysis.   Cardiovascular: Positive for chest pain and leg swelling.  Gastrointestinal: Positive for nausea and vomiting. Negative for abdominal pain.  Genitourinary: Negative for dysuria and frequency.  Musculoskeletal: Positive for myalgias. Negative for falls.  Skin: Negative for rash.  Neurological: Negative for focal weakness, loss of consciousness and headaches.  Psychiatric/Behavioral: Negative for memory loss and substance abuse.    Past Medical History:  Diagnosis Date  . Depression   . gastric ca   . HIV (human immunodeficiency virus infection) ([place])    positive-non active  . Hypertension   . Schizophrenia [facility])     Past Surgical History:  Procedure Laterality Date  . ABDOMINAL HYSTERECTOMY    . ANKLE SURGERY  2004   orif left  . FINGER SURGERY  2004   i/d lt middle finger  . STRABISMUS SURGERY Left 12/11/2007  . STRABISMUS SURGERY Bilateral 01/22/2014   Procedure: REPAIR STRABISMUS BILATERAL EYES;  Surgeon: Derry Skill, MD;  Location: [facility];  Service: Ophthalmology;  Laterality: Bilateral;  . THYROID SURGERY     needle tx     reports that she has never smoked. She has quit using smokeless tobacco. She reports that she does not drink alcohol or use drugs.  No Known Allergies  Family History  Problem Relation Age of Onset  . Heart disease Mother   . Colon cancer Neg Hx     Prior to Admission medications  Medication Sig Start Date End Date Taking? Authorizing Provider  escitalopram (LEXAPRO) 20 MG tablet Take 1 tablet (20 mg total) by mouth daily for 14 days. Patient taking differently: Take 20 mg by mouth daily.  05/17/18 12/01/18 Yes Lord, Asa Saunas, NP  hydrochlorothiazide (MICROZIDE) 12.5 MG  capsule Take 12.5 mg by mouth daily.  04/04/18  Yes [provider]  ipratropium-albuterol (DUONEB) 0.5-2.5 (3) MG/3ML SOLN Take 3 mLs by nebulization every 6 (six) hours as needed. Patient taking differently: Take 3 mLs by nebulization every 6 (six) hours as needed (wheezing).  10/26/18  Yes Sheikh, Omair Latif, DO  meloxicam (MOBIC) 15 MG tablet Take 15 mg by mouth daily.   Yes [provider]  nystatin cream (MYCOSTATIN) Apply 1 application topically 2 (two) times daily. Apply to affected area twice daily for 1 week Patient taking differently: Apply 1 application topically 2 (two) times daily.  05/04/18  Yes Jacqlyn Larsen, PA-C  pantoprazole (PROTONIX) 40 MG tablet Take 1 tablet (40 mg total) by mouth daily. 11/02/18  Yes Pollina, Gwenyth Allegra, MD  PROAIR HFA 108 [phone] Base) MCG/ACT inhaler Take 1 puff by mouth every 6 (six) hours as needed for shortness of breath. 10/08/18  Yes [provider]  QUEtiapine (SEROQUEL) 200 MG tablet Take 1 tablet (200 mg total) by mouth at bedtime. 05/17/18  Yes Patrecia Pour, NP    Physical Exam:  Constitutional: Obese elderly female who appears to be in some discomfort currently coughing. Vitals:   12/01/18 1745 12/01/18 1815 12/01/18 1830 12/01/18 1845  BP: 132/60 133/71 135/71 (!) 143/72  Pulse: 84 90 92 90  Resp: (!) 24 (!) 21 (!) 21 (!) 27  Temp:      TempSrc:      SpO2: 100% 97% 98% 92%  Weight:      Height:       Eyes: PERRL, lids and conjunctivae normal ENMT: Mucous membranes are moist.  Pale and boggy bilateral nasal turbinates.  Posterior pharynx clear of any exudate or lesions. Normal dentition.   Neck: normal, supple, no masses, no thyromegaly.  No JVD. Respiratory: Tachypneic with expiratory wheezes appreciated.  No rhonchi.  Patient on 2 L of nasal cannula oxygen currently able to talk in complete sentences. Cardiovascular: Regular rate and rhythm, no murmurs / rubs / gallops.  Trace lowerextremity edema. 2+ pedal  pulses. No carotid bruits.  Abdomen: no tenderness, no masses palpated. No hepatosplenomegaly. Bowel sounds positive.  Musculoskeletal: no clubbing / cyanosis. No joint deformity upper and lower extremities. Good ROM, no contractures. Normal muscle tone.  Skin: no rashes, lesions, ulcers. No induration Neurologic: CN 2-12 grossly intact. Sensation intact, DTR normal. Strength 5/5 in all 4.  Psychiatric: Normal judgment and insight. Alert and oriented x 3. Normal mood.     Labs on Admission: I have personally reviewed following labs and imaging studies  CBC: Recent Labs  Lab 12/01/18 1412  WBC 6.7  NEUTROABS 3.0  HGB 12.6  HCT 41.3  MCV 98.1  PLT 532   Basic Metabolic Panel: Recent Labs  Lab 12/01/18 1412  NA 138  K 3.7  CL 101  CO2 30  GLUCOSE 105*  BUN 18  CREATININE 0.77  CALCIUM 8.6*   GFR: Estimated Creatinine Clearance: 102.6 mL/min (by C-G formula based on SCr of 0.77 mg/dL). Liver Function Tests: No results for input(s): AST, ALT, ALKPHOS, BILITOT, PROT, ALBUMIN in the last 168 hours. No results for input(s): LIPASE, AMYLASE in the last 168 hours.  No results for input(s): AMMONIA in the last 168 hours. Coagulation Profile: No results for input(s): INR, PROTIME in the last 168 hours. Cardiac Enzymes: No results for input(s): CKTOTAL, CKMB, CKMBINDEX, TROPONINI in the last 168 hours. BNP (last 3 results) No results for input(s): PROBNP in the last 8760 hours. HbA1C: No results for input(s): HGBA1C in the last 72 hours. CBG: No results for input(s): GLUCAP in the last 168 hours. Lipid Profile: No results for input(s): CHOL, HDL, LDLCALC, TRIG, CHOLHDL, LDLDIRECT in the last 72 hours. Thyroid Function Tests: No results for input(s): TSH, T4TOTAL, FREET4, T3FREE, THYROIDAB in the last 72 hours. Anemia Panel: No results for input(s): VITAMINB12, FOLATE, FERRITIN, TIBC, IRON, RETICCTPCT in the last 72 hours. Urine analysis:    Component Value Date/Time    COLORURINE YELLOW 11/02/2018 0618   APPEARANCEUR CLEAR 11/02/2018 0618   LABSPEC >1.046 (H) 11/02/2018 0618   PHURINE 5.0 11/02/2018 0618   GLUCOSEU NEGATIVE 11/02/2018 0618   HGBUR NEGATIVE 11/02/2018 0618   HGBUR negative 05/02/2010 1111   BILIRUBINUR NEGATIVE 11/02/2018 0618   KETONESUR NEGATIVE 11/02/2018 0618   PROTEINUR NEGATIVE 11/02/2018 0618   UROBILINOGEN 0.2 05/02/2010 1111   NITRITE NEGATIVE 11/02/2018 0618   LEUKOCYTESUR NEGATIVE 11/02/2018 0618   Sepsis Labs: No results found for this or any previous visit (from the past 240 hour(s)).   Radiological Exams on Admission: Dg Chest Port 1 View  Result Date: 12/01/2018 CLINICAL DATA:  Shortness of breath, cough and wheezing. EXAM: PORTABLE CHEST 1 VIEW COMPARISON:  11/12/2018 FINDINGS: The heart size and mediastinal contours are within normal limits. Lung volumes are low bilaterally. Bibasilar atelectasis present. There is no evidence of pulmonary edema, consolidation, pneumothorax, nodule or pleural fluid. The visualized skeletal structures are unremarkable. IMPRESSION: Low lung volumes with bibasilar atelectasis. Electronically Signed   By: Aletta Edouard M.D.   On: 12/01/2018 16:11    EKG: Independently reviewed.  Sinus rhythm 86 bpm with QTc 495  Assessment/Plan Bronchitis, acute.  Patient presents with complaints of cough with some signs concerning for possible influenza or viral illness.  Patient does not appear to be fluid overloaded.  Chest x-ray showing low lung volumes with bibasilar atelectasis without signs of acute infiltrate.  Patient was never noted to be hypoxic and placed on oxygen for comfort. - Admit to a telemetry bed - Continuous pulse oximetry with oxygen as needed - Influenza screen unavailable, and will check respiratory virus panel today given multiple sick contacts - DuoNeb's 4 times daily - Continue doxycycline for possible infectious cause of symptoms - Prednisone 40 mg daily -  Mucinex  Paranoid schizophrenia - Continue Seroquel 200 mg po q nightly and Lexapro 20 mg daily  Prolonged QT interval:  QTc  prolonged at 495. - Recheck EKG in a.m. - Continue to monitor and hold QT prolonging medications interval worsening   DVT prophylaxis: lovenox  Code Status: full  Family Communication: none Disposition Plan: Likely discharge and in 1-2 days to shelter Consults called: none Admission status: Observation Norval Morton MD [facility] Pager [phone]   If 7PM-7AM, please contact night-coverage [email or web address] Password [facility]  12/01/2018, 7:18 PM

## 2018-12-01 NOTE — ED Provider Notes (Signed)
Assumed care from [facility], PA-C with plan to f/u on BNP, CXR, and re-eval after continuous nebulizer treatment.   Per her note, "Diane Sutton is a 62 y.o. Female with a history of COPD, hypertension, CHF, HIV, and schizophrenia, who presents to the emergency department who presents for evaluation of worsening shortness of breath and wheezing.  Patient reports symptoms started last night and have progressively worsened.  She reports cough is primarily dry, but she has had increasing difficulty catching her breath, the symptoms are present at rest and with exertion.  She reports she feels very wheezy and tight like she cannot get a deep breath in.  Patient denies any fevers.  No associated chest pain, she is had some mild swelling in her lower extremities but this is not significantly worse than her norm.  No lightheadedness or syncope.  No abdominal pain, nausea or vomiting, no diaphoresis.  Has been using her nebulizers at home without improvement has not tried anything for cough.  Patient with similar presentation earlier in November requiring admission for worsening COPD exacerbation."  Physical Exam  Constitutional: She appears well-developed and well-nourished. No distress.  HENT:  Head: Normocephalic and atraumatic.  Eyes: Conjunctivae are normal.  Neck: Neck supple.  Cardiovascular: Normal rate and regular rhythm.  No murmur heard. Pulmonary/Chest: She has decreased breath sounds (throughout all lung fields). She has wheezes (diffuse expiratory).  Tachypneic, speaking in short sentences  Abdominal: Soft. There is no tenderness.  Musculoskeletal: She exhibits no edema.  Neurological: She is alert.  Skin: Skin is warm and dry.  Psychiatric: She has a normal mood and affect.  Nursing note and vitals reviewed.  Results for orders placed or performed during the hospital encounter of 15/17/61  Basic metabolic panel  Result Value Ref Range   Sodium 138 135 - 145 mmol/L   Potassium 3.7  3.5 - 5.1 mmol/L   Chloride 101 98 - 111 mmol/L   CO2 30 22 - 32 mmol/L   Glucose, Bld 105 (H) 70 - 99 mg/dL   BUN 18 8 - 23 mg/dL   Creatinine, Ser 0.77 0.44 - 1.00 mg/dL   Calcium 8.6 (L) 8.9 - 10.3 mg/dL   GFR calc non Af Amer >60 >60 mL/min   GFR calc Af Amer >60 >60 mL/min   Anion gap 7 5 - 15  CBC with Differential  Result Value Ref Range   WBC 6.7 4.0 - 10.5 K/uL   RBC 4.21 3.87 - 5.11 MIL/uL   Hemoglobin 12.6 12.0 - 15.0 g/dL   HCT 41.3 36.0 - 46.0 %   MCV 98.1 80.0 - 100.0 fL   MCH 29.9 26.0 - 34.0 pg   MCHC 30.5 30.0 - 36.0 g/dL   RDW 13.3 11.5 - 15.5 %   Platelets 277 150 - 400 K/uL   nRBC 0.0 0.0 - 0.2 %   Neutrophils Relative % 44 %   Neutro Abs 3.0 1.7 - 7.7 K/uL   Lymphocytes Relative 37 %   Lymphs Abs 2.5 0.7 - 4.0 K/uL   Monocytes Relative 8 %   Monocytes Absolute 0.6 0.1 - 1.0 K/uL   Eosinophils Relative 10 %   Eosinophils Absolute 0.6 (H) 0.0 - 0.5 K/uL   Basophils Relative 1 %   Basophils Absolute 0.1 0.0 - 0.1 K/uL   Immature Granulocytes 0 %   Abs Immature Granulocytes 0.01 0.00 - 0.07 K/uL  Brain natriuretic peptide  Result Value Ref Range   B Natriuretic Peptide 12.8 0.0 -  100.0 pg/mL  I-stat troponin, ED  Result Value Ref Range   Troponin i, poc 0.00 0.00 - 0.08 ng/mL   Comment 3           Ct Abdomen Pelvis W Contrast  Result Date: 11/02/2018 CLINICAL DATA:  Mid abdominal pain EXAM: CT ABDOMEN AND PELVIS WITH CONTRAST TECHNIQUE: Multidetector CT imaging of the abdomen and pelvis was performed using the standard protocol following bolus administration of intravenous contrast. CONTRAST:  175mL ISOVUE-300 IOPAMIDOL (ISOVUE-300) INJECTION 61% COMPARISON:  None FINDINGS: Lower chest: Scar and/or subsegmental atelectasis within the lingula and posterior left lower lobe. No pleural fluid. Hepatobiliary: No focal liver abnormality is seen. No gallstones, gallbladder wall thickening, or biliary dilatation. Pancreas: Unremarkable. No pancreatic ductal  dilatation or surrounding inflammatory changes. Spleen: Normal in size without focal abnormality. Adrenals/Urinary Tract: Normal adrenal glands. The kidneys are unremarkable. The urinary bladder appears within normal limits. Stomach/Bowel: Stomach is within normal limits. Appendix appears normal. No evidence of bowel wall thickening, distention, or inflammatory changes. Vascular/Lymphatic: No significant vascular findings are present. No enlarged abdominal or pelvic lymph nodes. Reproductive: Status post hysterectomy. No adnexal masses. Other: No abdominal wall hernia or abnormality. No abdominopelvic ascites. Musculoskeletal: Spondylosis noted within the lumbar spine. IMPRESSION: 1. No acute findings within the abdomen or pelvis. Electronically Signed   By: Kerby Moors M.D.   On: 11/02/2018 01:20   Dg Chest Port 1 View  Result Date: 12/01/2018 CLINICAL DATA:  Shortness of breath, cough and wheezing. EXAM: PORTABLE CHEST 1 VIEW COMPARISON:  11/12/2018 FINDINGS: The heart size and mediastinal contours are within normal limits. Lung volumes are low bilaterally. Bibasilar atelectasis present. There is no evidence of pulmonary edema, consolidation, pneumothorax, nodule or pleural fluid. The visualized skeletal structures are unremarkable. IMPRESSION: Low lung volumes with bibasilar atelectasis. Electronically Signed   By: Aletta Edouard M.D.   On: 12/01/2018 16:11   Dg Chest Port 1 View  Result Date: 11/12/2018 CLINICAL DATA:  Acute onset of shortness of breath. EXAM: PORTABLE CHEST 1 VIEW COMPARISON:  Chest radiograph performed 10/26/2018 FINDINGS: The lungs are well-aerated. Vascular congestion is noted. Increased interstitial markings raise concern for pulmonary edema. There is no evidence of pleural effusion or pneumothorax. The cardiomediastinal silhouette is borderline enlarged. No acute osseous abnormalities are seen. IMPRESSION: Vascular congestion and borderline cardiomegaly. Increased interstitial  markings raise concern for pulmonary edema. Electronically Signed   By: Garald Balding M.D.   On: 11/12/2018 21:31   CBC WNL. BMP WNL. Trop negative BNP WNL CXR with low lung volumes and bibasilar atelectasis. No evidence of pulmonary edema.  EKG with NSR, HR 86, no significant change since last tracing.    Re-eval after continuous nebulizer treatment and she continues to be tachypneic and is speaking in short sentences.  Lung exam reveals decreased breath sounds throughout with diffuse expiratory wheezes.  Patient states she still does not feel back to baseline after a DuoNeb, albuterol nebulizer treatment and continuous albuterol nebulizer treatment.  She was also given Solu-Medrol.  She continues not to feel back to baseline with respect to her breathing.  Will plan for admission for COPD exacerbation.  7:10 PM CONSULT with internal medicine who states that this patient should be [facility] since she was discharged from their service less than 1 month ago.  7:23 PM CONSULT with Dr. Tamala Julian with hospitalist service who accepts pt for admission.     Rodney Booze, PA-C 12/01/18 [place], [place], DO 12/01/18  1925  

## 2018-12-01 NOTE — ED Provider Notes (Signed)
Complains planes of nonproductive cough and shortness of breath onset 2 days ago.  She denies any fever.  Nothing makes symptoms better or worse.  On exam alert speaks in sentences.  Mild respiratory distress.  Lungs with diffuse rhonchi and expiratory wheezes.  Abdomen obese, nontender extremities without edema.  Heart regular rate and rhythm   Orlie Dakin, MD 12/01/18 1524

## 2018-12-02 DIAGNOSIS — J40 Bronchitis, not specified as acute or chronic: Secondary | ICD-10-CM | POA: Diagnosis not present

## 2018-12-02 DIAGNOSIS — J441 Chronic obstructive pulmonary disease with (acute) exacerbation: Principal | ICD-10-CM

## 2018-12-02 DIAGNOSIS — R9431 Abnormal electrocardiogram [ECG] [EKG]: Secondary | ICD-10-CM | POA: Diagnosis present

## 2018-12-02 DIAGNOSIS — F200 Paranoid schizophrenia: Secondary | ICD-10-CM | POA: Diagnosis not present

## 2018-12-02 LAB — CBC
HCT: 38.4 % (ref 36.0–46.0)
Hemoglobin: 11.8 g/dL — ABNORMAL LOW (ref 12.0–15.0)
MCH: 29.8 pg (ref 26.0–34.0)
MCHC: 30.7 g/dL (ref 30.0–36.0)
MCV: 97 fL (ref 80.0–100.0)
Platelets: 242 10*3/uL (ref 150–400)
RBC: 3.96 MIL/uL (ref 3.87–5.11)
RDW: 13.2 % (ref 11.5–15.5)
WBC: 6.1 10*3/uL (ref 4.0–10.5)
nRBC: 0 % (ref 0.0–0.2)

## 2018-12-02 LAB — BASIC METABOLIC PANEL
Anion gap: 10 (ref 5–15)
BUN: 17 mg/dL (ref 8–23)
CALCIUM: 8.8 mg/dL — AB (ref 8.9–10.3)
CO2: 27 mmol/L (ref 22–32)
Chloride: 100 mmol/L (ref 98–111)
Creatinine, Ser: 0.77 mg/dL (ref 0.44–1.00)
GFR calc Af Amer: >60 mL/min (ref >60)
GFR calc non Af Amer: >60 mL/min (ref >60)
Glucose, Bld: 160 mg/dL — ABNORMAL HIGH (ref 70–99)
Potassium: 4.1 mmol/L (ref 3.5–5.1)
Sodium: 137 mmol/L (ref 135–145)

## 2018-12-02 LAB — TROPONIN I: Troponin I: <0.03 ng/mL (ref <0.03)

## 2018-12-02 MED ORDER — PREDNISONE 20 MG PO TABS
20.0000 mg | ORAL_TABLET | Freq: Once | ORAL | Status: AC
  Administered 2018-12-02: 20 mg via ORAL
  Filled 2018-12-02: qty 1

## 2018-12-02 MED ORDER — PREDNISONE 50 MG PO TABS
50.0000 mg | ORAL_TABLET | Freq: Every day | ORAL | Status: DC
  Administered 2018-12-03: 50 mg via ORAL
  Filled 2018-12-02: qty 1

## 2018-12-02 NOTE — Progress Notes (Signed)
[facility] PROGRESS NOTE  Diane Sutton BCW:[id number] DOB: 03/21/1956 DOA: 12/01/2018   PCP: Eston Esters, NP     Assessment/Plan: Principal Problem:   Bronchitis Active Problems:   Paranoid schizophrenia ([place])   Prolonged QT interval   62 y.o. female with medical history significant of illness of HTN, schizophrenia, remote tobacco use, and gastric cancer; who presents with complaints of cough and progressively worsening shortness of breath over the last 2 days.  Patient complains of a productive cough with white to yellowish sputum production. Having coughing spells only she was unable to catch her breath.  Patient admitted for COPD exacerbation.    Assessment and plan Bronchitis, acute.  Patient presents with complaints of cough with some signs concerning for possible influenza or viral illness, but  Respiratory panel was negative . Patient does not appear to be fluid overloaded.  Chest x-ray showing low lung volumes with bibasilar atelectasis without signs of acute infiltrate.  Patient   noted to be hypoxic 88% on RA with ambulation - continue telemetry - Continuous pulse oximetry with oxygen as needed - DuoNeb's 4 times daily - Continue doxycycline for possible infectious cause of symptoms - Prednisone 50 mg a day - Mucinex - not stable for discharge due to desaturation to 88% on room air persistent wheezing  Paranoid schizophrenia - Continue Seroquel 200 mg po q nightly and Lexapro 20 mg daily  Prolonged QT interval:  QTc  prolonged at 495. - Recheck EKG in a.m. - Continue to monitor and hold QT prolonging medications interval worsening    DVT prophylaxsis  Lovenox  Code Status:   Full code    Family Communication: Discussed in detail with the patient, all imaging results, lab results explained to the patient   Disposition Plan:   Anticipate discharge tomorrow if hypoxia has improved       Consultants:  none  Procedures:  none  Antibiotics: Anti-infectives (From admission, onward)   Start     Dose/Rate Route Frequency Ordered Stop   12/02/18 1000  doxycycline (VIBRA-TABS) tablet 100 mg     100 mg Oral Every 12 hours 12/01/18 2003     12/01/18 1900  doxycycline (VIBRA-TABS) tablet 100 mg     100 mg Oral  Once 12/01/18 1846 12/01/18 1909         HPI/Subjective: Symptomatically improved, less shortness of breath today  Objective: Vitals:   12/01/18 1845 12/01/18 2109 12/01/18 2123 12/02/18 0639  BP: (!) 143/72 133/67  124/63  Pulse: 90 88  92  Resp: (!) 27 (!) 22  18  Temp:  98 F (36.7 C)  98.8 F (37.1 C)  TempSrc:  Oral  Oral  SpO2: 92% 95% 96% 93%  Weight:  61.5 kg    Height:        Intake/Output Summary (Last 24 hours) at 12/02/2018 1111 Last data filed at 12/01/2018 2300 Gross per 24 hour  Intake 128 ml  Output -  Net 128 ml    Exam:  Examination:  General exam: Appears calm and comfortable  Respiratory system:  Wheezing on auscultation. Respiratory effort normal. Cardiovascular system: S1 & S2 heard, RRR. No JVD, murmurs, rubs, gallops or clicks. No pedal edema. Gastrointestinal system: Abdomen is nondistended, soft and nontender. No organomegaly or masses felt. Normal bowel sounds heard. Central nervous system: Alert and oriented. No focal neurological deficits. Extremities: Symmetric 5 x 5 power. Skin: No rashes, lesions or ulcers Psychiatry: Judgement and insight appear normal. Mood &  affect appropriate.     Data Reviewed: I have personally reviewed following labs and imaging studies  Micro Results Recent Results (from the past 240 hour(s))  Respiratory Panel by PCR     Status: None   Collection Time: 12/01/18  8:28 PM  Result Value Ref Range Status   Adenovirus NOT DETECTED NOT DETECTED Final   Coronavirus 229E NOT DETECTED NOT DETECTED Final   Coronavirus HKU1 NOT DETECTED NOT DETECTED Final   Coronavirus NL63 NOT  DETECTED NOT DETECTED Final   Coronavirus OC43 NOT DETECTED NOT DETECTED Final   Metapneumovirus NOT DETECTED NOT DETECTED Final   Rhinovirus / Enterovirus NOT DETECTED NOT DETECTED Final   Influenza A NOT DETECTED NOT DETECTED Final   Influenza B NOT DETECTED NOT DETECTED Final   Parainfluenza Virus 1 NOT DETECTED NOT DETECTED Final   Parainfluenza Virus 2 NOT DETECTED NOT DETECTED Final   Parainfluenza Virus 3 NOT DETECTED NOT DETECTED Final   Parainfluenza Virus 4 NOT DETECTED NOT DETECTED Final   Respiratory Syncytial Virus NOT DETECTED NOT DETECTED Final   Bordetella pertussis NOT DETECTED NOT DETECTED Final   Chlamydophila pneumoniae NOT DETECTED NOT DETECTED Final   Mycoplasma pneumoniae NOT DETECTED NOT DETECTED Final    Radiology Reports Dg Chest Port 1 View  Result Date: 12/01/2018 CLINICAL DATA:  Shortness of breath, cough and wheezing. EXAM: PORTABLE CHEST 1 VIEW COMPARISON:  11/12/2018 FINDINGS: The heart size and mediastinal contours are within normal limits. Lung volumes are low bilaterally. Bibasilar atelectasis present. There is no evidence of pulmonary edema, consolidation, pneumothorax, nodule or pleural fluid. The visualized skeletal structures are unremarkable. IMPRESSION: Low lung volumes with bibasilar atelectasis. Electronically Signed   By: Aletta Edouard M.D.   On: 12/01/2018 16:11   Dg Chest Port 1 View  Result Date: 11/12/2018 CLINICAL DATA:  Acute onset of shortness of breath. EXAM: PORTABLE CHEST 1 VIEW COMPARISON:  Chest radiograph performed 10/26/2018 FINDINGS: The lungs are well-aerated. Vascular congestion is noted. Increased interstitial markings raise concern for pulmonary edema. There is no evidence of pleural effusion or pneumothorax. The cardiomediastinal silhouette is borderline enlarged. No acute osseous abnormalities are seen. IMPRESSION: Vascular congestion and borderline cardiomegaly. Increased interstitial markings raise concern for pulmonary  edema. Electronically Signed   By: Garald Balding M.D.   On: 11/12/2018 21:31     CBC Recent Labs  Lab 12/01/18 1412 12/02/18 0122  WBC 6.7 6.1  HGB 12.6 11.8*  HCT 41.3 38.4  PLT 277 242  MCV 98.1 97.0  MCH 29.9 29.8  MCHC 30.5 30.7  RDW 13.3 13.2  LYMPHSABS 2.5  --   MONOABS 0.6  --   EOSABS 0.6*  --   BASOSABS 0.1  --     Chemistries  Recent Labs  Lab 12/01/18 1412 12/02/18 0122  NA 138 137  K 3.7 4.1  CL 101 100  CO2 30 27  GLUCOSE 105* 160*  BUN 18 17  CREATININE 0.77 0.77  CALCIUM 8.6* 8.8*   ------------------------------------------------------------------------------------------------------------------ estimated creatinine clearance is 69 mL/min (by C-G formula based on SCr of 0.77 mg/dL). ------------------------------------------------------------------------------------------------------------------ No results for input(s): HGBA1C in the last 72 hours. ------------------------------------------------------------------------------------------------------------------ No results for input(s): CHOL, HDL, LDLCALC, TRIG, CHOLHDL, LDLDIRECT in the last 72 hours. ------------------------------------------------------------------------------------------------------------------ No results for input(s): TSH, T4TOTAL, T3FREE, THYROIDAB in the last 72 hours.  Invalid input(s): FREET3 ------------------------------------------------------------------------------------------------------------------ No results for input(s): VITAMINB12, FOLATE, FERRITIN, TIBC, IRON, RETICCTPCT in the last 72 hours.  Coagulation profile No results for input(s): INR, PROTIME  in the last 168 hours.  No results for input(s): DDIMER in the last 72 hours.  Cardiac Enzymes Recent Labs  Lab 12/01/18 2100 12/02/18 0122 12/02/18 0715  TROPONINI <0.03 <0.03 <0.03   ------------------------------------------------------------------------------------------------------------------ Invalid  input(s): POCBNP   CBG: No results for input(s): GLUCAP in the last 168 hours.     Studies: Dg Chest Port 1 View  Result Date: 12/01/2018 CLINICAL DATA:  Shortness of breath, cough and wheezing. EXAM: PORTABLE CHEST 1 VIEW COMPARISON:  11/12/2018 FINDINGS: The heart size and mediastinal contours are within normal limits. Lung volumes are low bilaterally. Bibasilar atelectasis present. There is no evidence of pulmonary edema, consolidation, pneumothorax, nodule or pleural fluid. The visualized skeletal structures are unremarkable. IMPRESSION: Low lung volumes with bibasilar atelectasis. Electronically Signed   By: Aletta Edouard M.D.   On: 12/01/2018 16:11      Lab Results  Component Value Date   HGBA1C 5.7 (H) 10/26/2018   HGBA1C 5.7 (H) 02/13/2011   Lab Results  Component Value Date   LDLCALC 120 (H) 02/13/2011   CREATININE 0.77 12/02/2018       Scheduled Meds: . doxycycline  100 mg Oral Q12H  . enoxaparin (LOVENOX) injection  40 mg Subcutaneous Q24H  . escitalopram  20 mg Oral Daily  . guaiFENesin  600 mg Oral BID  . hydrochlorothiazide  12.5 mg Oral Daily  . ipratropium-albuterol  3 mL Nebulization QID  . mouth rinse  15 mL Mouth Rinse BID  . nystatin cream  1 application Topical BID  . pantoprazole  40 mg Oral Daily  . predniSONE  20 mg Oral Once  . [START ON 12/03/2018] predniSONE  50 mg Oral Q breakfast  . QUEtiapine  200 mg Oral QHS  . sodium chloride flush  3 mL Intravenous Q12H   Continuous Infusions: . albuterol Stopped (12/01/18 1848)     LOS: 0 days    Time spent: >30 MINS    Reyne Dumas  [facility] Pager [phone]. If 7PM-7AM, please contact night-coverage at [email or web address], password [facility] 12/02/2018, 11:11 AM  LOS: 0 days

## 2018-12-02 NOTE — Social Work (Signed)
CSW acknowledging pt from [facility]. Should pt require support with transportation at discharge please let CSW know at number below.  Westley Hummer, MSW, [place] Work [phone]

## 2018-12-02 NOTE — Progress Notes (Signed)
Pt arrived to unit and ambulated to bed. Identified appropriately and assessed. Pt alert and oriented x 4, vitals WNL and skin intact. Oriented to room and unit. Call bell placed within reach and pt instructed to call if needing to ambulate. Will continue to monitor.

## 2018-12-03 DIAGNOSIS — Z79899 Other long term (current) drug therapy: Secondary | ICD-10-CM | POA: Diagnosis not present

## 2018-12-03 DIAGNOSIS — Z87891 Personal history of nicotine dependence: Secondary | ICD-10-CM | POA: Diagnosis not present

## 2018-12-03 DIAGNOSIS — J9601 Acute respiratory failure with hypoxia: Secondary | ICD-10-CM

## 2018-12-03 DIAGNOSIS — Z21 Asymptomatic human immunodeficiency virus [HIV] infection status: Secondary | ICD-10-CM | POA: Diagnosis not present

## 2018-12-03 DIAGNOSIS — J440 Chronic obstructive pulmonary disease with acute lower respiratory infection: Secondary | ICD-10-CM | POA: Diagnosis present

## 2018-12-03 DIAGNOSIS — Z6841 Body Mass Index (BMI) 40.0 and over, adult: Secondary | ICD-10-CM | POA: Diagnosis not present

## 2018-12-03 DIAGNOSIS — Z791 Long term (current) use of non-steroidal anti-inflammatories (NSAID): Secondary | ICD-10-CM | POA: Diagnosis not present

## 2018-12-03 DIAGNOSIS — I509 Heart failure, unspecified: Secondary | ICD-10-CM | POA: Diagnosis present

## 2018-12-03 DIAGNOSIS — I110 Hypertensive heart disease with heart failure: Secondary | ICD-10-CM | POA: Diagnosis present

## 2018-12-03 DIAGNOSIS — J441 Chronic obstructive pulmonary disease with (acute) exacerbation: Secondary | ICD-10-CM | POA: Diagnosis present

## 2018-12-03 DIAGNOSIS — Z85028 Personal history of other malignant neoplasm of stomach: Secondary | ICD-10-CM | POA: Diagnosis not present

## 2018-12-03 DIAGNOSIS — R9431 Abnormal electrocardiogram [ECG] [EKG]: Secondary | ICD-10-CM | POA: Diagnosis present

## 2018-12-03 DIAGNOSIS — J209 Acute bronchitis, unspecified: Secondary | ICD-10-CM | POA: Diagnosis present

## 2018-12-03 DIAGNOSIS — F200 Paranoid schizophrenia: Secondary | ICD-10-CM | POA: Diagnosis present

## 2018-12-03 MED ORDER — METHYLPREDNISOLONE SODIUM SUCC 40 MG IJ SOLR
40.0000 mg | Freq: Four times a day (QID) | INTRAMUSCULAR | Status: DC
  Administered 2018-12-03 – 2018-12-04 (×4): 40 mg via INTRAVENOUS
  Filled 2018-12-03 (×4): qty 1

## 2018-12-03 NOTE — Progress Notes (Signed)
Pt at 98% on 2l oxygen nasal canula. Nasal canula removed pt de-sat to 82% on room air at rest. Nasal canula re-applied at 2l o2 sat 07% after re-application

## 2018-12-03 NOTE — Progress Notes (Signed)
[facility] PROGRESS NOTE  Diane Sutton SJG:[id number] DOB: 05-Nov-1956 DOA: 12/01/2018   PCP: Eston Esters, NP   Assessment/Plan: Principal Problem:   Bronchitis Active Problems:   Paranoid schizophrenia ([place])   Prolonged QT interval   62 y.o. female with medical history significant of illness of HTN, schizophrenia, remote tobacco use, and gastric cancer; who presents with complaints of cough and progressively worsening shortness of breath over the last 2 days.  Patient complains of a productive cough with white to yellowish sputum production. Having coughing spells only she was unable to catch her breath.  Patient admitted for COPD exacerbation.    Assessment and plan  Acute respiratory failure- hypoxia -on room air with exertion, O2 sats fall to the 70s (nurse Bee to document in a progress note) -continues to wean as tolerate -treat COPD exacerbation  COPD, acute.   - Continuous pulse oximetry with oxygen as needed - DuoNeb's 4 times daily -pulmonary toilet - Continue doxycycline for possible infectious cause of symptoms - Prednisone 50 mg a day -changed to IV solumedrol as patient is still tight and hypoxic - Mucinex  Paranoid schizophrenia - Continue Seroquel 200 mg po q nightly and Lexapro 20 mg daily  Prolonged QT interval:  QTc  prolonged at 495.     DVT prophylaxsis  Lovenox  Code Status:   Full code  Family Communication: patient Disposition Plan:   Barriers: wean off O2--- drops into 70s on room air        Antibiotics: Anti-infectives (From admission, onward)   Start     Dose/Rate Route Frequency Ordered Stop   12/02/18 1000  doxycycline (VIBRA-TABS) tablet 100 mg     100 mg Oral Every 12 hours 12/01/18 2003     12/01/18 1900  doxycycline (VIBRA-TABS) tablet 100 mg     100 mg Oral  Once 12/01/18 1846 12/01/18 1909         HPI/Subjective: Feels ok with O2 on but nursing removed and with very little exertion (pick up purse)  her O2 sats went down to the 70s  Objective: Vitals:   12/02/18 2122 12/03/18 0423 12/03/18 0840 12/03/18 1119  BP: 136/82 (!) 153/95    Pulse: 83 76    Resp: 16 16    Temp: 98.4 F (36.9 C) 98.5 F (36.9 C)    TempSrc: Oral Oral    SpO2: 98% 99% 99% 99%  Weight: (!) 136.8 kg     Height: 5\' 6"  (1.676 m)      No intake or output data in the 24 hours ending 12/03/18 1135  Exam:  Examination:  General exam: NAD, in chair-- 2L [place] on Respiratory system:  Not moving much air-- lungs tight-- no wheezing Cardiovascular system: rrr Gastrointestinal system: +BS, obese Central nervous system: alert + oriented Psychiatry: normal mood    Data Reviewed: I have personally reviewed following labs and imaging studies  Micro Results Recent Results (from the past 240 hour(s))  Respiratory Panel by PCR     Status: None   Collection Time: 12/01/18  8:28 PM  Result Value Ref Range Status   Adenovirus NOT DETECTED NOT DETECTED Final   Coronavirus 229E NOT DETECTED NOT DETECTED Final   Coronavirus HKU1 NOT DETECTED NOT DETECTED Final   Coronavirus NL63 NOT DETECTED NOT DETECTED Final   Coronavirus OC43 NOT DETECTED NOT DETECTED Final   Metapneumovirus NOT DETECTED NOT DETECTED Final   Rhinovirus / Enterovirus NOT DETECTED NOT DETECTED Final   Influenza A NOT DETECTED NOT  DETECTED Final   Influenza B NOT DETECTED NOT DETECTED Final   Parainfluenza Virus 1 NOT DETECTED NOT DETECTED Final   Parainfluenza Virus 2 NOT DETECTED NOT DETECTED Final   Parainfluenza Virus 3 NOT DETECTED NOT DETECTED Final   Parainfluenza Virus 4 NOT DETECTED NOT DETECTED Final   Respiratory Syncytial Virus NOT DETECTED NOT DETECTED Final   Bordetella pertussis NOT DETECTED NOT DETECTED Final   Chlamydophila pneumoniae NOT DETECTED NOT DETECTED Final   Mycoplasma pneumoniae NOT DETECTED NOT DETECTED Final    Radiology Reports Dg Chest Port 1 View  Result Date: 12/01/2018 CLINICAL DATA:  Shortness of breath,  cough and wheezing. EXAM: PORTABLE CHEST 1 VIEW COMPARISON:  11/12/2018 FINDINGS: The heart size and mediastinal contours are within normal limits. Lung volumes are low bilaterally. Bibasilar atelectasis present. There is no evidence of pulmonary edema, consolidation, pneumothorax, nodule or pleural fluid. The visualized skeletal structures are unremarkable. IMPRESSION: Low lung volumes with bibasilar atelectasis. Electronically Signed   By: Aletta Edouard M.D.   On: 12/01/2018 16:11   Dg Chest Port 1 View  Result Date: 11/12/2018 CLINICAL DATA:  Acute onset of shortness of breath. EXAM: PORTABLE CHEST 1 VIEW COMPARISON:  Chest radiograph performed 10/26/2018 FINDINGS: The lungs are well-aerated. Vascular congestion is noted. Increased interstitial markings raise concern for pulmonary edema. There is no evidence of pleural effusion or pneumothorax. The cardiomediastinal silhouette is borderline enlarged. No acute osseous abnormalities are seen. IMPRESSION: Vascular congestion and borderline cardiomegaly. Increased interstitial markings raise concern for pulmonary edema. Electronically Signed   By: Garald Balding M.D.   On: 11/12/2018 21:31     CBC Recent Labs  Lab 12/01/18 1412 12/02/18 0122  WBC 6.7 6.1  HGB 12.6 11.8*  HCT 41.3 38.4  PLT 277 242  MCV 98.1 97.0  MCH 29.9 29.8  MCHC 30.5 30.7  RDW 13.3 13.2  LYMPHSABS 2.5  --   MONOABS 0.6  --   EOSABS 0.6*  --   BASOSABS 0.1  --     Chemistries  Recent Labs  Lab 12/01/18 1412 12/02/18 0122  NA 138 137  K 3.7 4.1  CL 101 100  CO2 30 27  GLUCOSE 105* 160*  BUN 18 17  CREATININE 0.77 0.77  CALCIUM 8.6* 8.8*   ------------------------------------------------------------------------------------------------------------------ estimated creatinine clearance is 101.3 mL/min (by C-G formula based on SCr of 0.77  mg/dL). ------------------------------------------------------------------------------------------------------------------ No results for input(s): HGBA1C in the last 72 hours. ------------------------------------------------------------------------------------------------------------------ No results for input(s): CHOL, HDL, LDLCALC, TRIG, CHOLHDL, LDLDIRECT in the last 72 hours. ------------------------------------------------------------------------------------------------------------------ No results for input(s): TSH, T4TOTAL, T3FREE, THYROIDAB in the last 72 hours.  Invalid input(s): FREET3 ------------------------------------------------------------------------------------------------------------------ No results for input(s): VITAMINB12, FOLATE, FERRITIN, TIBC, IRON, RETICCTPCT in the last 72 hours.  Coagulation profile No results for input(s): INR, PROTIME in the last 168 hours.  No results for input(s): DDIMER in the last 72 hours.  Cardiac Enzymes Recent Labs  Lab 12/01/18 2100 12/02/18 0122 12/02/18 0715  TROPONINI <0.03 <0.03 <0.03   ------------------------------------------------------------------------------------------------------------------ Invalid input(s): POCBNP   CBG: No results for input(s): GLUCAP in the last 168 hours.     Studies: Dg Chest Port 1 View  Result Date: 12/01/2018 CLINICAL DATA:  Shortness of breath, cough and wheezing. EXAM: PORTABLE CHEST 1 VIEW COMPARISON:  11/12/2018 FINDINGS: The heart size and mediastinal contours are within normal limits. Lung volumes are low bilaterally. Bibasilar atelectasis present. There is no evidence of pulmonary edema, consolidation, pneumothorax, nodule or pleural fluid. The visualized skeletal structures are  unremarkable. IMPRESSION: Low lung volumes with bibasilar atelectasis. Electronically Signed   By: Aletta Edouard M.D.   On: 12/01/2018 16:11      Lab Results  Component Value Date   HGBA1C 5.7  (H) 10/26/2018   HGBA1C 5.7 (H) 02/13/2011   Lab Results  Component Value Date   LDLCALC 120 (H) 02/13/2011   CREATININE 0.77 12/02/2018       Scheduled Meds: . doxycycline  100 mg Oral Q12H  . enoxaparin (LOVENOX) injection  40 mg Subcutaneous Q24H  . escitalopram  20 mg Oral Daily  . guaiFENesin  600 mg Oral BID  . hydrochlorothiazide  12.5 mg Oral Daily  . ipratropium-albuterol  3 mL Nebulization QID  . mouth rinse  15 mL Mouth Rinse BID  . nystatin cream  1 application Topical BID  . pantoprazole  40 mg Oral Daily  . predniSONE  50 mg Oral Q breakfast  . QUEtiapine  200 mg Oral QHS  . sodium chloride flush  3 mL Intravenous Q12H   Continuous Infusions: . albuterol Stopped (12/01/18 1848)     LOS: 0 days    Time spent: [facility]  If 7PM-7AM, please contact night-coverage at [email or web address], password [facility] 12/03/2018, 11:35 AM  LOS: 0 days

## 2018-12-04 LAB — BASIC METABOLIC PANEL
Anion gap: 8 (ref 5–15)
BUN: 16 mg/dL (ref 8–23)
CHLORIDE: 100 mmol/L (ref 98–111)
CO2: 30 mmol/L (ref 22–32)
Calcium: 9.2 mg/dL (ref 8.9–10.3)
Creatinine, Ser: 0.73 mg/dL (ref 0.44–1.00)
GFR calc Af Amer: >60 mL/min (ref >60)
GFR calc non Af Amer: >60 mL/min (ref >60)
Glucose, Bld: 124 mg/dL — ABNORMAL HIGH (ref 70–99)
Potassium: 4.1 mmol/L (ref 3.5–5.1)
Sodium: 138 mmol/L (ref 135–145)

## 2018-12-04 LAB — URINALYSIS, ROUTINE W REFLEX MICROSCOPIC
Bilirubin Urine: NEGATIVE
Glucose, UA: NEGATIVE mg/dL
Hgb urine dipstick: NEGATIVE
Ketones, ur: NEGATIVE mg/dL
Leukocytes, UA: NEGATIVE
Nitrite: NEGATIVE
Protein, ur: NEGATIVE mg/dL
Specific Gravity, Urine: 1.005 (ref 1.005–1.030)
pH: 5 (ref 5.0–8.0)

## 2018-12-04 LAB — MAGNESIUM: Magnesium: 2.2 mg/dL (ref 1.7–2.4)

## 2018-12-04 MED ORDER — DOXYCYCLINE HYCLATE 100 MG PO TABS: 100.0000 mg | ORAL_TABLET | Freq: Two times a day (BID) | ORAL | 0 refills | Status: DC

## 2018-12-04 MED ORDER — FLUTICASONE-SALMETEROL 250-50 MCG/DOSE IN AEPB: 1.0000 | INHALATION_SPRAY | Freq: Two times a day (BID) | RESPIRATORY_TRACT | 0 refills | Status: DC

## 2018-12-04 MED ORDER — GUAIFENESIN ER 600 MG PO TB12: 600.0000 mg | ORAL_TABLET | Freq: Two times a day (BID) | ORAL | 0 refills | Status: DC

## 2018-12-04 MED ORDER — PREDNISONE 10 MG PO TABS: ORAL_TABLET | ORAL | 0 refills | Status: DC

## 2018-12-04 NOTE — Progress Notes (Signed)
Patient discharged back to shelter. We reviewed D/C paperwork and medications prior to discharge. Pt sent home with bus pass.

## 2018-12-04 NOTE — Progress Notes (Signed)
Ambulatory O2 sat performed by this MD-patient walked down the hallway-no SOB-O2 sat 90% on RA. Does not require O2 on discharge.

## 2018-12-04 NOTE — Discharge Summary (Signed)
PATIENT DETAILS Name: Diane Sutton Age: 62 y.o. Sex: female Date of Birth: May 27, 1956 MRN: [id number]. Admitting Physician: Norval Morton, MD EHM:[id number], Brayton Layman, NP  Admit Date: 12/01/2018 Discharge date: 12/04/2018  Recommendations for Outpatient Follow-up:  1. Follow up with PCP in 1-2 weeks 2. Please obtain BMP/CBC in one week  Admitted From:  Home  Disposition: [place]: No  Equipment/Devices: None  Discharge Condition: Stable  CODE STATUS: FULL CODE  Diet recommendation:  Heart Healthy   Brief Summary: See H&P, Labs, Consult and Test reports for all details in brief, patient is a 62 year old female with history of hypertension, schizophrenia, probably COPD/asthma (on inhalers at home) morbid obesity remote history of tobacco use who presented to the hospital with cough, progressively worsening shortness of breath-thought to have COPD exacerbation and admitted to the hospitalist service.  See below for further details  Brief Hospital Course: Acute hypoxic respiratory failure secondary to COPD exacerbation: Much improved-she feels better and is back to her baseline this morning.  She is requesting discharge.  She was taken off oxygen by this MD-O2 saturation at rest remain in the mid to high 90s, she was subsequently ambulated in the hallway-home O2 saturations were around 90% on room air.  Her lungs are completely clear-there is no rhonchi.  Since she has improved rapidly-she is thought to be stable to discharge-she is to continue her usual bronchodilator regimen-I have added Advair-we will place on tapering steroids-continue doxycycline for a few days.  She is to follow-up with her primary care practitioner for further continued care.  Prolonged QTC: Resolved-per EKG done this morning.  History of paranoid schizophrenia: Continue Seroquel and Lexapro-this appeared to be stable during the short hospital  stay.  Procedures/Studies: None  Discharge Diagnoses:  Principal Problem:   Bronchitis Active Problems:   Paranoid schizophrenia (HCC)   Prolonged QT interval   Discharge Instructions:  Activity:  As tolerated   Discharge Instructions    Call MD for:  difficulty breathing, headache or visual disturbances   Complete by:  As directed    Diet - low sodium heart healthy   Complete by:  As directed    Discharge instructions   Complete by:  As directed    Follow with Primary MD  Eston Esters, NP in 1 week  Please get a complete blood count and chemistry panel checked by your Primary MD at your next visit, and again as instructed by your Primary MD.  Get Medicines reviewed and adjusted: Please take all your medications with you for your next visit with your Primary MD  Laboratory/radiological data: Please request your Primary MD to go over all hospital tests and procedure/radiological results at the follow up, please ask your Primary MD to get all Hospital records sent to his/her office.  In some cases, they will be blood work, cultures and biopsy results pending at the time of your discharge. Please request that your primary care M.D. follows up on these results.  Also Note the following: If you experience worsening of your admission symptoms, develop shortness of breath, life threatening emergency, suicidal or homicidal thoughts you must seek medical attention immediately by calling 911 or calling your MD immediately  if symptoms less severe.  You must read complete instructions/literature along with all the possible adverse reactions/side effects for all the Medicines you take and that have been prescribed to you. Take any new Medicines after you have completely understood and accpet all the possible adverse reactions/side effects.  Do not drive when taking Pain medications or sleeping medications (Benzodaizepines)  Do not take more than prescribed Pain, Sleep and  Anxiety Medications. It is not advisable to combine anxiety,sleep and pain medications without talking with your primary care practitioner  Special Instructions: If you have smoked or chewed Tobacco  in the last 2 yrs please stop smoking, stop any regular Alcohol  and or any Recreational drug use.  Wear Seat belts while driving.  Please note: You were cared for by a hospitalist during your hospital stay. Once you are discharged, your primary care physician will handle any further medical issues. Please note that NO REFILLS for any discharge medications will be authorized once you are discharged, as it is imperative that you return to your primary care physician (or establish a relationship with a primary care physician if you do not have one) for your post hospital discharge needs so that they can reassess your need for medications and monitor your lab values.   Increase activity slowly   Complete by:  As directed      Allergies as of 12/04/2018   No Known Allergies     Medication List    TAKE these medications   doxycycline 100 MG tablet Commonly known as:  VIBRA-TABS Take 1 tablet (100 mg total) by mouth every 12 (twelve) hours.   escitalopram 20 MG tablet Commonly known as:  LEXAPRO Take 1 tablet (20 mg total) by mouth daily for 14 days.   Fluticasone-Salmeterol 250-50 MCG/DOSE Aepb Commonly known as:  ADVAIR Inhale 1 puff into the lungs 2 (two) times daily.   guaiFENesin 600 MG 12 hr tablet Commonly known as:  MUCINEX Take 1 tablet (600 mg total) by mouth 2 (two) times daily.   hydrochlorothiazide 12.5 MG capsule Commonly known as:  MICROZIDE Take 12.5 mg by mouth daily.   ipratropium-albuterol 0.5-2.5 (3) MG/3ML Soln Commonly known as:  DUONEB Take 3 mLs by nebulization every 6 (six) hours as needed. What changed:  reasons to take this   meloxicam 15 MG tablet Commonly known as:  MOBIC Take 15 mg by mouth daily.   nystatin cream Commonly known as:   MYCOSTATIN Apply 1 application topically 2 (two) times daily. Apply to affected area twice daily for 1 week What changed:  additional instructions   pantoprazole 40 MG tablet Commonly known as:  PROTONIX Take 1 tablet (40 mg total) by mouth daily.   predniSONE 10 MG tablet Commonly known as:  DELTASONE Take 4 tablets (40 mg) daily for 2 days, then, Take 3 tablets (30 mg) daily for 2 days, then, Take 2 tablets (20 mg) daily for 2 days, then, Take 1 tablets (10 mg) daily for 1 days, then stop   PROAIR HFA 108 (90 Base) MCG/ACT inhaler Generic drug:  albuterol Take 1 puff by mouth every 6 (six) hours as needed for shortness of breath.   QUEtiapine 200 MG tablet Commonly known as:  SEROQUEL Take 1 tablet (200 mg total) by mouth at bedtime.            Durable Medical Equipment  (From admission, onward)         Start     Ordered   12/03/18 0906  For home use only DME Walker rolling  Once    Question:  Patient needs a walker to treat with the following condition  Answer:  Hypoxia   12/03/18 0905         Follow-up Information    Eston Esters, NP.  Schedule an appointment as soon as possible for a visit in 1 week(s).   Specialty:  Family Medicine Contact information: [place] [address] [phone]          No Known Allergies  Consultations:   None  Other Procedures/Studies: Dg Chest Port 1 View  Result Date: 12/01/2018 CLINICAL DATA:  Shortness of breath, cough and wheezing. EXAM: PORTABLE CHEST 1 VIEW COMPARISON:  11/12/2018 FINDINGS: The heart size and mediastinal contours are within normal limits. Lung volumes are low bilaterally. Bibasilar atelectasis present. There is no evidence of pulmonary edema, consolidation, pneumothorax, nodule or pleural fluid. The visualized skeletal structures are unremarkable. IMPRESSION: Low lung volumes with bibasilar atelectasis. Electronically Signed   By: Aletta Edouard M.D.   On: 12/01/2018  16:11   Dg Chest Port 1 View  Result Date: 11/12/2018 CLINICAL DATA:  Acute onset of shortness of breath. EXAM: PORTABLE CHEST 1 VIEW COMPARISON:  Chest radiograph performed 10/26/2018 FINDINGS: The lungs are well-aerated. Vascular congestion is noted. Increased interstitial markings raise concern for pulmonary edema. There is no evidence of pleural effusion or pneumothorax. The cardiomediastinal silhouette is borderline enlarged. No acute osseous abnormalities are seen. IMPRESSION: Vascular congestion and borderline cardiomegaly. Increased interstitial markings raise concern for pulmonary edema. Electronically Signed   By: Garald Balding M.D.   On: 11/12/2018 21:31      TODAY-DAY OF DISCHARGE:  Subjective:   Diane Sutton today has no headache,no chest abdominal pain,no new weakness tingling or numbness, feels much better wants to go home today.   Objective:   Blood pressure 134/77, pulse 90, temperature 97.9 F (36.6 C), temperature source Oral, resp. rate 18, height 5\' 6"  (1.676 m), weight (!) 136.8 kg, SpO2 97 %.  Intake/Output Summary (Last 24 hours) at 12/04/2018 0832 Last data filed at 12/04/2018 0521 Gross per 24 hour  Intake 240 ml  Output 300 ml  Net -60 ml   Filed Weights   12/01/18 1345 12/01/18 2109 12/02/18 2122  Weight: 133 kg 61.5 kg (!) 136.8 kg    Exam: Awake Alert, Oriented *3, No new F.N deficits, Normal affect [place].AT,PERRAL Supple Neck,No JVD, No cervical lymphadenopathy appriciated.  Symmetrical Chest wall movement, Good air movement bilaterally, CTAB RRR,No Gallops,Rubs or new Murmurs, No Parasternal Heave +ve B.Sounds, Abd Soft, Non tender, No organomegaly appriciated, No rebound -guarding or rigidity. No Cyanosis, Clubbing or edema, No new Rash or bruise   PERTINENT RADIOLOGIC STUDIES: Dg Chest Port 1 View  Result Date: 12/01/2018 CLINICAL DATA:  Shortness of breath, cough and wheezing. EXAM: PORTABLE CHEST 1 VIEW COMPARISON:  11/12/2018 FINDINGS:  The heart size and mediastinal contours are within normal limits. Lung volumes are low bilaterally. Bibasilar atelectasis present. There is no evidence of pulmonary edema, consolidation, pneumothorax, nodule or pleural fluid. The visualized skeletal structures are unremarkable. IMPRESSION: Low lung volumes with bibasilar atelectasis. Electronically Signed   By: Aletta Edouard M.D.   On: 12/01/2018 16:11   Dg Chest Port 1 View  Result Date: 11/12/2018 CLINICAL DATA:  Acute onset of shortness of breath. EXAM: PORTABLE CHEST 1 VIEW COMPARISON:  Chest radiograph performed 10/26/2018 FINDINGS: The lungs are well-aerated. Vascular congestion is noted. Increased interstitial markings raise concern for pulmonary edema. There is no evidence of pleural effusion or pneumothorax. The cardiomediastinal silhouette is borderline enlarged. No acute osseous abnormalities are seen. IMPRESSION: Vascular congestion and borderline cardiomegaly. Increased interstitial markings raise concern for pulmonary edema. Electronically Signed   By: Francoise Schaumann.D.  On: 11/12/2018 21:31     PERTINENT LAB RESULTS: CBC: Recent Labs    12/01/18 1412 12/02/18 0122  WBC 6.7 6.1  HGB 12.6 11.8*  HCT 41.3 38.4  PLT 277 242   CMET CMP     Component Value Date/Time   NA 137 12/02/2018 0122   K 4.1 12/02/2018 0122   CL 100 12/02/2018 0122   CO2 27 12/02/2018 0122   GLUCOSE 160 (H) 12/02/2018 0122   BUN 17 12/02/2018 0122   CREATININE 0.77 12/02/2018 0122   CALCIUM 8.8 (L) 12/02/2018 0122   PROT 7.2 11/02/2018 0121   ALBUMIN 3.3 (L) 11/02/2018 0121   AST 15 11/02/2018 0121   ALT 24 11/02/2018 0121   ALKPHOS 70 11/02/2018 0121   BILITOT 0.5 11/02/2018 0121   GFRNONAA >60 12/02/2018 0122   GFRAA >60 12/02/2018 0122    GFR Estimated Creatinine Clearance: 101.3 mL/min (by C-G formula based on SCr of 0.77 mg/dL). No results for input(s): LIPASE, AMYLASE in the last 72 hours. Recent Labs    12/01/18 2100  12/02/18 0122 12/02/18 0715  TROPONINI <0.03 <0.03 <0.03   Invalid input(s): POCBNP No results for input(s): DDIMER in the last 72 hours. No results for input(s): HGBA1C in the last 72 hours. No results for input(s): CHOL, HDL, LDLCALC, TRIG, CHOLHDL, LDLDIRECT in the last 72 hours. No results for input(s): TSH, T4TOTAL, T3FREE, THYROIDAB in the last 72 hours.  Invalid input(s): FREET3 No results for input(s): VITAMINB12, FOLATE, FERRITIN, TIBC, IRON, RETICCTPCT in the last 72 hours. Coags: No results for input(s): INR in the last 72 hours.  Invalid input(s): PT Microbiology: Recent Results (from the past 240 hour(s))  Respiratory Panel by PCR     Status: None   Collection Time: 12/01/18  8:28 PM  Result Value Ref Range Status   Adenovirus NOT DETECTED NOT DETECTED Final   Coronavirus 229E NOT DETECTED NOT DETECTED Final   Coronavirus HKU1 NOT DETECTED NOT DETECTED Final   Coronavirus NL63 NOT DETECTED NOT DETECTED Final   Coronavirus OC43 NOT DETECTED NOT DETECTED Final   Metapneumovirus NOT DETECTED NOT DETECTED Final   Rhinovirus / Enterovirus NOT DETECTED NOT DETECTED Final   Influenza A NOT DETECTED NOT DETECTED Final   Influenza B NOT DETECTED NOT DETECTED Final   Parainfluenza Virus 1 NOT DETECTED NOT DETECTED Final   Parainfluenza Virus 2 NOT DETECTED NOT DETECTED Final   Parainfluenza Virus 3 NOT DETECTED NOT DETECTED Final   Parainfluenza Virus 4 NOT DETECTED NOT DETECTED Final   Respiratory Syncytial Virus NOT DETECTED NOT DETECTED Final   Bordetella pertussis NOT DETECTED NOT DETECTED Final   Chlamydophila pneumoniae NOT DETECTED NOT DETECTED Final   Mycoplasma pneumoniae NOT DETECTED NOT DETECTED Final    FURTHER DISCHARGE INSTRUCTIONS:  Get Medicines reviewed and adjusted: Please take all your medications with you for your next visit with your Primary MD  Laboratory/radiological data: Please request your Primary MD to go over all hospital tests and  procedure/radiological results at the follow up, please ask your Primary MD to get all Hospital records sent to his/her office.  In some cases, they will be blood work, cultures and biopsy results pending at the time of your discharge. Please request that your primary care M.D. goes through all the records of your hospital data and follows up on these results.  Also Note the following: If you experience worsening of your admission symptoms, develop shortness of breath, life threatening emergency, suicidal or homicidal thoughts you must  seek medical attention immediately by calling 911 or calling your MD immediately  if symptoms less severe.  You must read complete instructions/literature along with all the possible adverse reactions/side effects for all the Medicines you take and that have been prescribed to you. Take any new Medicines after you have completely understood and accpet all the possible adverse reactions/side effects.   Do not drive when taking Pain medications or sleeping medications (Benzodaizepines)  Do not take more than prescribed Pain, Sleep and Anxiety Medications. It is not advisable to combine anxiety,sleep and pain medications without talking with your primary care practitioner  Special Instructions: If you have smoked or chewed Tobacco  in the last 2 yrs please stop smoking, stop any regular Alcohol  and or any Recreational drug use.  Wear Seat belts while driving.  Please note: You were cared for by a hospitalist during your hospital stay. Once you are discharged, your primary care physician will handle any further medical issues. Please note that NO REFILLS for any discharge medications will be authorized once you are discharged, as it is imperative that you return to your primary care physician (or establish a relationship with a primary care physician if you do not have one) for your post hospital discharge needs so that they can reassess your need for medications and  monitor your lab values.  Total Time spent coordinating discharge including counseling, education and face to face time equals 25 minutes.  SignedOren Binet 12/04/2018 8:32 AM

## 2019-08-10 ENCOUNTER — Other Ambulatory Visit: Payer: Self-pay

## 2019-08-10 ENCOUNTER — Ambulatory Visit (INDEPENDENT_AMBULATORY_CARE_PROVIDER_SITE_OTHER): Payer: Medicare Other | Admitting: Neurology

## 2019-08-10 ENCOUNTER — Telehealth: Payer: Self-pay | Admitting: Neurology

## 2019-08-10 ENCOUNTER — Encounter: Payer: Self-pay | Admitting: Neurology

## 2019-08-10 VITALS — BP 142/85 | HR 97 | Temp 97.4°F | Ht 66.5 in | Wt 293.5 lb

## 2019-08-10 DIAGNOSIS — G3281 Cerebellar ataxia in diseases classified elsewhere: Secondary | ICD-10-CM | POA: Diagnosis not present

## 2019-08-10 DIAGNOSIS — R251 Tremor, unspecified: Secondary | ICD-10-CM

## 2019-08-10 DIAGNOSIS — F200 Paranoid schizophrenia: Secondary | ICD-10-CM

## 2019-08-10 DIAGNOSIS — R269 Unspecified abnormalities of gait and mobility: Secondary | ICD-10-CM

## 2019-08-10 NOTE — Progress Notes (Signed)
[facility]  PATIENT: Diane Sutton DOB: [birth year]  REFERRING DOCTOR OR PCP: Dr. Yong Channel SOURCE: Patient, notes from primary care, records from emergency room and admissions over the past several years, laboratory reports.  _________________________________   HISTORICAL  CHIEF COMPLAINT:  Chief Complaint  Patient presents with  . New Patient (Initial Visit)    RM 12, alone. Paper referral from Dr. Yong Channel for headaches. Pt states she is having whole body shakes. Episodes occur daily.   [name] Headache    She is getting very few headaches.   . Gait Problem    Ambulates with cane. Has had 1 fall in last year. Broke finger on left hand. Denies hitting head.    HISTORY OF PRESENT ILLNESS:  I had the pleasure of seeing your patient, Diane Sutton, at [facility] for neurologic consultation regarding her tremors and history of headaches.  She is a 63 yo woman who has noted a tremor with her head shaking and sometimes a sensation like her torso is shaking.   She also notes a tremor in her hands.   Tremors started a couple years ago but gradually worsened this year.  She notes them more when she is sitting down and less when she is walking.   They are worse when she is holding an item or writing.   Tremors do not change with emotion.   She also feels she is fidgeting more   She has used a cane since last year due to shaking she states.   She feels her balance is worse.   She had a fall earlier this year when she stumbled over a curb.    She notes no change in bladder function.   She has noted some numbness in her legs.   She was hearing voices and was placed on Seroquel at bedtime many years ago.  Her dose was upped from 200 mg to 300 mg two years ago.  It has helped and she no longer hear voices and she is sleeping better.   At various times, she has been diagnosed with either paranoid schizophrenia or schizoaffective disorder.  She sees Gerrie Nordmann  at [facility].  Of note, in 2012 her dose of Seroquel was 25 mg.  She used to live with her sister but is currently living in a hotel.  She used to have a lot of headaches but now has very few.  When the headaches were present, they were pounding and usually bilateral.     She reports she was HIV positive but not now.   The HIV screen 9 months ago was negative.  HIV1 RNA not detected.    CD4 count was 390.  She has COPD and is on inhalers.  She smoked for several years when she was younger.  REVIEW OF SYSTEMS: Constitutional: No fevers, chills, sweats, or change in appetite Eyes: No visual changes, double vision, eye pain Ear, nose and throat: No hearing loss, ear pain, nasal congestion, sore throat Cardiovascular: No chest pain, palpitations Respiratory: No shortness of breath at rest or with exertion.   She has COPD GastrointestinaI: No nausea, vomiting, diarrhea, abdominal pain, fecal incontinence Genitourinary: No dysuria, urinary retention or frequency.  No nocturia. Musculoskeletal: No neck pain, back pain Integumentary: No rash, pruritus, skin lesions Neurological: as above Psychiatric: As above Endocrine: No palpitations, diaphoresis, change in appetite, change in weigh or increased thirst Hematologic/Lymphatic: No anemia, purpura, petechiae. Allergic/Immunologic: No itchy/runny eyes, nasal congestion, recent allergic reactions, rashes  ALLERGIES:  No Known Allergies  HOME MEDICATIONS:  Current Outpatient Medications:  .  cetirizine (ZYRTEC) 10 MG tablet, Take 10 mg by mouth daily., Disp: , Rfl:  .  escitalopram (LEXAPRO) 20 MG tablet, Take 1 tablet (20 mg total) by mouth daily for 14 days. (Patient taking differently: Take 20 mg by mouth daily. ), Disp: 30 tablet, Rfl: 0 .  hydrochlorothiazide (MICROZIDE) 12.5 MG capsule, Take 12.5 mg by mouth daily. , Disp: , Rfl: 4 .  ipratropium-albuterol (DUONEB) 0.5-2.5 (3) MG/3ML SOLN, Take 3 mLs by nebulization every 6 (six) hours as  needed. (Patient taking differently: Take 3 mLs by nebulization every 6 (six) hours as needed (wheezing). ), Disp: 360 mL, Rfl: 0 .  PROAIR HFA 108 (90 Base) MCG/ACT inhaler, Take 1 puff by mouth every 6 (six) hours as needed for shortness of breath., Disp: , Rfl: 3 .  QUEtiapine (SEROQUEL) 200 MG tablet, Take 1 tablet (200 mg total) by mouth at bedtime. (Patient taking differently: Take 300 mg by mouth at bedtime. ), Disp: 30 tablet, Rfl: 0  PAST MEDICAL HISTORY: Past Medical History:  Diagnosis Date  . Cancer ([place])   . Depression   . gastric ca   . HIV (human immunodeficiency virus infection) ([place])    positive-non active  . Hypertension   . Migraine   . Schizophrenia ([place])     PAST SURGICAL HISTORY: Past Surgical History:  Procedure Laterality Date  . ABDOMINAL HYSTERECTOMY    . ANKLE SURGERY  2004   orif left  . FINGER SURGERY  2004   i/d lt middle finger  . STRABISMUS SURGERY Left 12/11/2007  . STRABISMUS SURGERY Bilateral 01/22/2014   Procedure: REPAIR STRABISMUS BILATERAL EYES;  Surgeon: Derry Skill, MD;  Location: [facility];  Service: Ophthalmology;  Laterality: Bilateral;  . THYROID SURGERY     needle tx    FAMILY HISTORY: Family History  Problem Relation Age of Onset  . Heart disease Mother   . Colon cancer Neg Hx     SOCIAL HISTORY:  Social History   Socioeconomic History  . Marital status: Single    Spouse name: Not on file  . Number of children: 0  . Years of education: 70  . Highest education level: Not on file  Occupational History  . Occupation: unemployed  Social Needs  . Financial resource strain: Very hard  . Food insecurity    Worry: Never true    Inability: Never true  . Transportation needs    Medical: No    Non-medical: No  Tobacco Use  . Smoking status: Never Smoker  . Smokeless tobacco: Former Network engineer and Sexual Activity  . Alcohol use: No  . Drug use: No  . Sexual activity: Not Currently  Lifestyle   . Physical activity    Days per week: 0 days    Minutes per session: 0 min  . Stress: To some extent  Relationships  . Social Herbalist on phone: Once a week    Gets together: Never    Attends religious service: 1 to 4 times per year    Active member of club or organization: No    Attends meetings of clubs or organizations: Never    Relationship status: Never married  . Intimate partner violence    Fear of current or ex partner: No    Emotionally abused: No    Physically abused: No    Forced sexual activity: No  Other Topics  Concern  . Not on file  Social History Narrative   Pt from shelter with increasing cough and shortness of breath.   Soda sometimes   Lives with sister   Right handed     PHYSICAL EXAM  Vitals:   08/10/19 1018  BP: (!) 142/85  Pulse: 97  Temp: (!) 97.4 F (36.3 C)  Weight: 293 lb 8 oz (133.1 kg)  Height: 5' 6.5" (1.689 m)    Body mass index is 46.66 kg/m.   General: The patient is well-developed and well-nourished and in no acute distress  HEENT:  Head is [place]/AT.  Sclera are anicteric.  Funduscopic exam shows normal optic discs and retinal vessels.  Neck: No carotid bruits are noted.  The neck is nontender.  Cardiovascular: The heart has a regular rate and rhythm with a normal S1 and S2. There were no murmurs, gallops or rubs.    Skin: Extremities are without rash or  edema.  Musculoskeletal:  Back is nontender  Neurologic Exam  Mental status: The patient is alert and oriented x 3 at the time of the examination. The patient has apparent normal recent and remote memory, with an apparently normal attention span and concentration ability.   Speech is normal.  Cranial nerves: Extraocular movements are full. Pupils are equal, round, and reactive to light and accomodation.  Visual fields are full.  Facial symmetry is present. There is good facial sensation to soft touch bilaterally.Facial strength is normal.  Trapezius and  sternocleidomastoid strength is normal. No dysarthria is noted.  The tongue is midline, and the patient has symmetric elevation of the soft palate. No obvious hearing deficits are noted.  Motor: She has some low amplitude jerks in her hands and head going or interaction.  She also has a low amplitude tremor in her hands with intention.   Tone is normal.  No cogwheeling.  No bradykinesia.  Strength is  5 / 5 in all 4 extremities.   Sensory: Sensory testing is intact to pinprick, soft touch and vibration sensation in all 4 extremities.  Coordination: Cerebellar testing reveals good finger-nose-finger and heel-to-shin bilaterally.  Gait and station: Station is normal.   The gait is slightly wide and the stride is mildly reduced.  She takes 4 steps to turn 180 degrees.  She has retropulsion when I disturb her posture backwards.  Romberg is negative.   Reflexes: Deep tendon reflexes are symmetric and normal bilaterally.   Plantar responses are flexor.    DIAGNOSTIC DATA (LABS, IMAGING, TESTING) - I reviewed patient records, labs, notes, testing and imaging myself where available.  Lab Results  Component Value Date   WBC 6.1 12/02/2018   HGB 11.8 (L) 12/02/2018   HCT 38.4 12/02/2018   MCV 97.0 12/02/2018   PLT 242 12/02/2018      Component Value Date/Time   NA 138 12/04/2018 0745   K 4.1 12/04/2018 0745   CL 100 12/04/2018 0745   CO2 30 12/04/2018 0745   GLUCOSE 124 (H) 12/04/2018 0745   BUN 16 12/04/2018 0745   CREATININE 0.73 12/04/2018 0745   CALCIUM 9.2 12/04/2018 0745   PROT 7.2 11/02/2018 0121   ALBUMIN 3.3 (L) 11/02/2018 0121   AST 15 11/02/2018 0121   ALT 24 11/02/2018 0121   ALKPHOS 70 11/02/2018 0121   BILITOT 0.5 11/02/2018 0121   GFRNONAA >60 12/04/2018 0745   GFRAA >60 12/04/2018 0745   Lab Results  Component Value Date   CHOL 193 02/13/2011   HDL  50 02/13/2011   LDLCALC 120 (H) 02/13/2011   TRIG 113 02/13/2011   CHOLHDL 3.9 Ratio 02/13/2011   Lab Results   Component Value Date   HGBA1C 5.7 (H) 10/26/2018   No results found for: QBHALPFX90 Lab Results  Component Value Date   TSH 0.080 (L) 03/17/2010       ASSESSMENT AND PLAN  1. 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 Tremor   4. Paranoid schizophrenia ([place])     In summary, Ms. Emmerich is a 63 year old woman who has had worsening tremor and difficulties with gait balance over the last year.  On examination, she did have some jerking in the hands and head but no classic tardive dyskinesia symptoms in the face.  She also had retropulsion with her gait and reduced stride with increased number of steps to turn.  I am concerned that her symptoms may have worsened while the Seroquel dose was increased and she could be having some parkinsonian symptoms because of this.  Therefore, I have asked her to reduce the Seroquel to 1/2 pill and to discuss further with psychiatry at her next visit.  If symptoms improve, then the Seroquel was likely playing a significant role.  We also will check an MRI of the brain and cervical spine to make sure that there is not an alternative explanation such as stroke or myelopathy that might explain her gait and tremor.  If the MRI is normal and tremors are not improved by reduction of Seroquel, consider low-dose Mysoline for a possible essential tremor.  Due to her COPD I would avoid beta-blockers.  She will return to see me in 3 months or sooner if there are new or worsening neurologic symptoms.  Thank you for asking me to see Ms. Stegeman.  Please let me know if I can be of further assistance with her or other patients in the future.   The phone number on record belongs to her sister.  She can also be reached at [place]. Felecia Shelling, MD, [facility] 2/40/9735, 32:99 AM Certified in Neurology, Clinical Neurophysiology, Sleep Medicine and Neuroimaging  [facility] [address]  [place], [place] [address] [phone]

## 2019-08-10 NOTE — Telephone Encounter (Signed)
Medicare/medicaid order sent to [facility]. No auth they will reach out to the patient to schedule.  °

## 2019-09-09 ENCOUNTER — Other Ambulatory Visit: Payer: Medicaid Other

## 2020-01-05 ENCOUNTER — Encounter (HOSPITAL_COMMUNITY): Payer: Self-pay

## 2020-01-05 ENCOUNTER — Other Ambulatory Visit: Payer: Self-pay

## 2020-01-05 ENCOUNTER — Emergency Department (HOSPITAL_COMMUNITY)
Admission: EM | Admit: 2020-01-05 | Discharge: 2020-01-05 | Disposition: A | Discharge status: Home / Self Care | Payer: Medicare Other | Attending: Emergency Medicine | Admitting: Emergency Medicine

## 2020-01-05 DIAGNOSIS — U071 COVID-19: Secondary | ICD-10-CM | POA: Insufficient documentation

## 2020-01-05 DIAGNOSIS — R05 Cough: Secondary | ICD-10-CM | POA: Diagnosis present

## 2020-01-05 DIAGNOSIS — Z5321 Procedure and treatment not carried out due to patient leaving prior to being seen by health care provider: Secondary | ICD-10-CM | POA: Diagnosis not present

## 2020-01-05 LAB — POC SARS CORONAVIRUS 2 AG -  ED: SARS Coronavirus 2 Ag: POSITIVE — AB

## 2020-01-05 NOTE — ED Triage Notes (Signed)
Pt presents w/headache, nasal drainage and chills starting yesterday. Decrease appetite. Pt reports she rides the city bus daily, no known sick family members.

## 2020-01-05 NOTE — ED Notes (Signed)
Pt states she is leaving  

## 2020-01-06 ENCOUNTER — Telehealth: Payer: Self-pay | Admitting: Nurse Practitioner

## 2020-01-06 NOTE — Telephone Encounter (Signed)
Called to Discuss with patient about Covid symptoms and the use of bamlanivimab, a monoclonal antibody infusion for those with mild to moderate Covid symptoms and at a high risk of hospitalization.     Pt is qualified for this infusion at the [facility] infusion center due to co-morbid conditions and/or a member of an at-risk group.     Unable to reach pt  

## 2020-01-07 ENCOUNTER — Emergency Department (HOSPITAL_COMMUNITY)
Admission: EM | Admit: 2020-01-07 | Discharge: 2020-01-07 | Disposition: A | Discharge status: Home / Self Care | Payer: Medicare Other | Attending: Emergency Medicine | Admitting: Emergency Medicine

## 2020-01-07 ENCOUNTER — Other Ambulatory Visit: Payer: Self-pay

## 2020-01-07 ENCOUNTER — Encounter (HOSPITAL_COMMUNITY): Payer: Self-pay

## 2020-01-07 ENCOUNTER — Emergency Department (HOSPITAL_COMMUNITY): Payer: Medicare Other

## 2020-01-07 DIAGNOSIS — B20 Human immunodeficiency virus [HIV] disease: Secondary | ICD-10-CM | POA: Insufficient documentation

## 2020-01-07 DIAGNOSIS — Z79899 Other long term (current) drug therapy: Secondary | ICD-10-CM | POA: Insufficient documentation

## 2020-01-07 DIAGNOSIS — M7918 Myalgia, other site: Secondary | ICD-10-CM | POA: Diagnosis not present

## 2020-01-07 DIAGNOSIS — R05 Cough: Secondary | ICD-10-CM | POA: Diagnosis present

## 2020-01-07 DIAGNOSIS — I10 Essential (primary) hypertension: Secondary | ICD-10-CM | POA: Diagnosis not present

## 2020-01-07 DIAGNOSIS — J449 Chronic obstructive pulmonary disease, unspecified: Secondary | ICD-10-CM | POA: Insufficient documentation

## 2020-01-07 DIAGNOSIS — U071 COVID-19: Secondary | ICD-10-CM | POA: Insufficient documentation

## 2020-01-07 DIAGNOSIS — Z859 Personal history of malignant neoplasm, unspecified: Secondary | ICD-10-CM | POA: Diagnosis not present

## 2020-01-07 DIAGNOSIS — Z87891 Personal history of nicotine dependence: Secondary | ICD-10-CM | POA: Diagnosis not present

## 2020-01-07 NOTE — ED Notes (Signed)
An After Visit Summary was printed and given to the patient. Discharge instructions given and no further questions at this time. Pt denies SOB and chest pain at this time.

## 2020-01-07 NOTE — ED Notes (Signed)
ED Provider at bedside. 

## 2020-01-07 NOTE — ED Provider Notes (Signed)
[facility] DEPT Provider Note   CSN: [id number] Arrival date & time: 01/07/20  1120     History Chief Complaint  Patient presents with  . COVID+  . Chills    Diane Sutton is a 63 y.o. female.  The history is provided by the patient.  URI Presenting symptoms: cough   Presenting symptoms: no ear pain, no fever and no sore throat   Severity:  Mild Onset quality:  Gradual Timing:  Constant Progression:  Unchanged Chronicity:  New Relieved by:  Nothing Worsened by:  Nothing Associated symptoms: myalgias   Associated symptoms: no arthralgias   Risk factors: recent illness (COVID positive)        Past Medical History:  Diagnosis Date  . Cancer ([place])   . Depression   . gastric ca   . HIV (human immunodeficiency virus infection) ([place])    positive-non active  . Hypertension   . Migraine   . Schizophrenia [facility])     Patient Active Problem List   Diagnosis Date Noted  . Gait disturbance 08/10/2019  . Tremor 08/10/2019  . Prolonged QT interval 12/02/2018  . Bronchitis 12/01/2018  . COPD with acute exacerbation ([place]) 11/13/2018  . Acute bronchitis 11/12/2018  . Acute respiratory failure ([place]) 10/24/2018  . HIV positive but "non-active"   . Paranoid schizophrenia ([place]) 02/05/2011  . SKIN RASH 10/04/2010  . ALLERGIC RHINITIS 05/02/2010  . CANDIDIASIS 04/05/2010  . THYROID NODULE, RIGHT 10/07/2009  . PNEUMONIA 09/23/2009  . CERVICAL RADICULOPATHY, LEFT 09/12/2009  . UTI 08/10/2009  . CHEST PAIN UNSPECIFIED 08/10/2009  . 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 INGROWING NAIL 10/05/2008  . PRURITUS, EARS 06/08/2008  . OTALGIA 06/02/2008  . VAGINITIS, BACTERIAL 03/31/2008  . ARTHRITIS, RIGHT KNEE 03/23/2008  . OTHER ABNORMAL BLOOD CHEMISTRY 01/29/2008  . GOITER, MULTINODULAR 08/29/2007  . HYPERTHYROIDISM 08/29/2007  . DEPRESSION 08/29/2007  . MIGRAINE, CHRONIC 08/29/2007  . STRABISMUS 08/29/2007  . AUDITORY HALLUCINATION  08/29/2007    Past Surgical History:  Procedure Laterality Date  . ABDOMINAL HYSTERECTOMY    . ANKLE SURGERY  2004   orif left  . FINGER SURGERY  2004   i/d lt middle finger  . STRABISMUS SURGERY Left 12/11/2007  . STRABISMUS SURGERY Bilateral 01/22/2014   Procedure: REPAIR STRABISMUS BILATERAL EYES;  Surgeon: Derry Skill, MD;  Location: [facility];  Service: Ophthalmology;  Laterality: Bilateral;  . THYROID SURGERY     needle tx     OB History   No obstetric history on file.     Family History  Problem Relation Age of Onset  . Heart disease Mother   . Colon cancer Neg Hx     Social History   Tobacco Use  . Smoking status: Never Smoker  . Smokeless tobacco: Former Network engineer Use Topics  . Alcohol use: No  . Drug use: No    Home Medications Prior to Admission medications   Medication Sig Start Date End Date Taking? Authorizing Provider  cetirizine (ZYRTEC) 10 MG tablet Take 10 mg by mouth daily.    [provider]  escitalopram (LEXAPRO) 20 MG tablet Take 1 tablet (20 mg total) by mouth daily for 14 days. Patient taking differently: Take 20 mg by mouth daily.  05/17/18 08/10/19  Patrecia Pour, NP  hydrochlorothiazide (MICROZIDE) 12.5 MG capsule Take 12.5 mg by mouth daily.  04/04/18   [provider]  ipratropium-albuterol (DUONEB) 0.5-2.5 (3) MG/3ML SOLN Take 3 mLs by nebulization every 6 (  six) hours as needed. Patient taking differently: Take 3 mLs by nebulization every 6 (six) hours as needed (wheezing).  10/26/18   Sheikh, Omair Latif, DO  PROAIR HFA 108 [phone] Base) MCG/ACT inhaler Take 1 puff by mouth every 6 (six) hours as needed for shortness of breath. 10/08/18   [provider]  QUEtiapine (SEROQUEL) 200 MG tablet Take 1 tablet (200 mg total) by mouth at bedtime. Patient taking differently: Take 300 mg by mouth at bedtime.  05/17/18   Patrecia Pour, NP    Allergies    Patient has no known allergies.  Review  of Systems   Review of Systems  Constitutional: Negative for chills and fever.  HENT: Negative for ear pain and sore throat.   Eyes: Negative for pain and visual disturbance.  Respiratory: Positive for cough. Negative for shortness of breath.   Cardiovascular: Negative for chest pain and palpitations.  Gastrointestinal: Negative for abdominal pain and vomiting.  Genitourinary: Negative for dysuria and hematuria.  Musculoskeletal: Positive for myalgias. Negative for arthralgias and back pain.  Skin: Negative for color change and rash.  Neurological: Negative for seizures and syncope.  All other systems reviewed and are negative.   Physical Exam Updated Vital Signs BP 125/81 (BP Location: Left Arm)   Pulse 93   Temp 99.5 F (37.5 C) (Oral)   Resp 18   SpO2 94%   Physical Exam Vitals and nursing note reviewed.  Constitutional:      General: She is not in acute distress.    Appearance: She is well-developed. She is not ill-appearing.  HENT:     Head: Normocephalic and atraumatic.     Nose: Nose normal.     Mouth/Throat:     Mouth: Mucous membranes are moist.  Eyes:     Extraocular Movements: Extraocular movements intact.     Conjunctiva/sclera: Conjunctivae normal.     Pupils: Pupils are equal, round, and reactive to light.  Cardiovascular:     Rate and Rhythm: Normal rate and regular rhythm.     Pulses: Normal pulses.     Heart sounds: Normal heart sounds. No murmur.  Pulmonary:     Effort: Pulmonary effort is normal. No respiratory distress.     Breath sounds: Normal breath sounds.  Abdominal:     Palpations: Abdomen is soft.     Tenderness: There is no abdominal tenderness.  Musculoskeletal:     Cervical back: Normal range of motion and neck supple.  Skin:    General: Skin is warm and dry.     Capillary Refill: Capillary refill takes less than 2 seconds.  Neurological:     General: No focal deficit present.     Mental Status: She is alert.  Psychiatric:         Mood and Affect: Mood normal.     ED Results / Procedures / Treatments   Labs (all labs ordered are listed, but only abnormal results are displayed) Labs Reviewed - No data to display  EKG None  Radiology No results found.  Procedures Procedures (including critical care time)  Medications Ordered in ED Medications - No data to display  ED Course  I have reviewed the triage vital signs and the nursing notes.  Pertinent labs & imaging results that were available during my care of the patient were reviewed by me and considered in my medical decision making (see chart for details).    MDM Rules/Calculators/A&P  Diane Sutton is a 64 year old female with history of  hypertension, schizophrenia who presents the ED with body aches. Tested positive for Covid 2 days ago. Has normal vitals. Normal room air oxygenation both at rest and with exertion. X-ray overall unremarkable. Has symptoms likely consistent with coronavirus. Given further education about self-isolation and return precautions. Denies any chest pain, shortness of breath, abdominal pain. Given phone number for infusion center. Discharged in good condition.  This chart was dictated using voice recognition software.  Despite best efforts to proofread,  errors can occur which can change the documentation meaning.   Diane Sutton was evaluated in Emergency Department on 01/07/2020 for the symptoms described in the history of present illness. She was evaluated in the context of the global COVID-19 pandemic, which necessitated consideration that the patient might be at risk for infection with the SARS-CoV-2 virus that causes COVID-19. Institutional protocols and algorithms that pertain to the evaluation of patients at risk for COVID-19 are in a state of rapid change based on information released by regulatory bodies including the CDC and federal and state organizations. These policies and algorithms were followed during the patient's  care in the ED.   Final Clinical Impression(s) / ED Diagnoses Final diagnoses:  [id number]    Rx / DC Orders ED Discharge Orders    None       Lennice Sites, DO 01/07/20 1212

## 2020-01-07 NOTE — ED Triage Notes (Signed)
Pt dx with COVID 1/5. Pt c/o fever and chills. Pt states she took tylenol sinus last night which helped. Pt c/o headache. Pt states that she is feeling better than she did on 1/5.

## 2020-01-07 NOTE — Discharge Instructions (Addendum)
Call [phone] to talk about possible infusion treatment for coronavirus. Please return to the ED if your symptoms worsen. Please continue self-isolation as you are positive for coronavirus.

## 2020-01-28 ENCOUNTER — Encounter: Payer: Self-pay | Admitting: Family Medicine

## 2020-01-28 ENCOUNTER — Ambulatory Visit: Payer: Medicare Other | Admitting: Family Medicine

## 2020-01-28 ENCOUNTER — Other Ambulatory Visit: Payer: Self-pay

## 2020-01-28 NOTE — Progress Notes (Deleted)
PATIENT: Diane Sutton DOB: [birth year]  REASON FOR VISIT: follow up HISTORY FROM: patient  No chief complaint on file.    HISTORY OF PRESENT ILLNESS: Today 01/28/20 Diane Sutton is a 64 y.o. female here today for follow up for tremor and headaches.    HISTORY: (copied from Dr Garth Bigness note on 08/10/2019)  I had the pleasure of seeing your patient, Diane Sutton, at [facility] for neurologic consultation regarding her tremors and history of headaches.  She is a 64 yo woman who has noted a tremor with her head shaking and sometimes a sensation like her torso is shaking.   She also notes a tremor in her hands.   Tremors started a couple years ago but gradually worsened this year.  She notes them more when she is sitting down and less when she is walking.   They are worse when she is holding an item or writing.   Tremors do not change with emotion.   She also feels she is fidgeting more   She has used a cane since last year due to shaking she states.   She feels her balance is worse.   She had a fall earlier this year when she stumbled over a curb.    She notes no change in bladder function.   She has noted some numbness in her legs.   She was hearing voices and was placed on Seroquel at bedtime many years ago.  Her dose was upped from 200 mg to 300 mg two years ago.  It has helped and she no longer hear voices and she is sleeping better.   At various times, she has been diagnosed with either paranoid schizophrenia or schizoaffective disorder.  She sees Gerrie Nordmann at [place].  Of note, in 2012 her dose of Seroquel was 25 mg.  She used to live with her sister but is currently living in a hotel.  She used to have a lot of headaches but now has very few.  When the headaches were present, they were pounding and usually bilateral.     She reports she was HIV positive but not now.   The HIV screen 9 months ago was negative.  HIV1 RNA not detected.    CD4 count  was 390.  She has COPD and is on inhalers.  She smoked for several years when she was younger.   REVIEW OF SYSTEMS: Out of a complete 14 system review of symptoms, the patient complains only of the following symptoms, and all other reviewed systems are negative.  ALLERGIES: No Known Allergies  HOME MEDICATIONS: Outpatient Medications Prior to Visit  Medication Sig Dispense Refill  . cetirizine (ZYRTEC) 10 MG tablet Take 10 mg by mouth daily.    [name] escitalopram (LEXAPRO) 20 MG tablet Take 1 tablet (20 mg total) by mouth daily for 14 days. (Patient taking differently: Take 20 mg by mouth daily. ) 30 tablet 0  . hydrochlorothiazide (MICROZIDE) 12.5 MG capsule Take 12.5 mg by mouth daily.   4  . ipratropium-albuterol (DUONEB) 0.5-2.5 (3) MG/3ML SOLN Take 3 mLs by nebulization every 6 (six) hours as needed. (Patient taking differently: Take 3 mLs by nebulization every 6 (six) hours as needed (wheezing). ) 360 mL 0  . PROAIR HFA 108 (90 Base) MCG/ACT inhaler Take 1 puff by mouth every 6 (six) hours as needed for shortness of breath.  3  . QUEtiapine (SEROQUEL) 200 MG tablet Take 1 tablet (200 mg total) by mouth at  bedtime. (Patient taking differently: Take 300 mg by mouth at bedtime. ) 30 tablet 0   No facility-administered medications prior to visit.    PAST MEDICAL HISTORY: Past Medical History:  Diagnosis Date  . Cancer ([place])   . Depression   . gastric ca   . HIV (human immunodeficiency virus infection) ([place])    positive-non active  . Hypertension   . Migraine   . Schizophrenia ([place])     PAST SURGICAL HISTORY: Past Surgical History:  Procedure Laterality Date  . ABDOMINAL HYSTERECTOMY    . ANKLE SURGERY  2004   orif left  . FINGER SURGERY  2004   i/d lt middle finger  . STRABISMUS SURGERY Left 12/11/2007  . STRABISMUS SURGERY Bilateral 01/22/2014   Procedure: REPAIR STRABISMUS BILATERAL EYES;  Surgeon: Derry Skill, MD;  Location: [place];  Service:  Ophthalmology;  Laterality: Bilateral;  . THYROID SURGERY     needle tx    FAMILY HISTORY: Family History  Problem Relation Age of Onset  . Heart disease Mother   . Colon cancer Neg Hx     SOCIAL HISTORY: Social History   Socioeconomic History  . Marital status: Single    Spouse name: Not on file  . Number of children: 0  . Years of education: 61  . Highest education level: Not on file  Occupational History  . Occupation: unemployed  Tobacco Use  . Smoking status: Never Smoker  . Smokeless tobacco: Former Network engineer and Sexual Activity  . Alcohol use: No  . Drug use: No  . Sexual activity: Not Currently  Other Topics Concern  . Not on file  Social History Narrative   Pt from shelter with increasing cough and shortness of breath.   Soda sometimes   Lives with sister   Right handed   Social Determinants of Health   Financial Resource Strain:   . Difficulty of Paying Living Expenses: Not on file  Food Insecurity:   . Worried About Charity fundraiser in the Last Year: Not on file  . Ran Out of Food in the Last Year: Not on file  Transportation Needs:   . Lack of Transportation (Medical): Not on file  . Lack of Transportation (Non-Medical): Not on file  Physical Activity:   . Days of Exercise per Week: Not on file  . Minutes of Exercise per Session: Not on file  Stress:   . Feeling of Stress : Not on file  Social Connections:   . Frequency of Communication with Friends and Family: Not on file  . Frequency of Social Gatherings with Friends and Family: Not on file  . Attends Religious Services: Not on file  . Active Member of Clubs or Organizations: Not on file  . Attends Archivist Meetings: Not on file  . Marital Status: Not on file  Intimate Partner Violence:   . Fear of Current or Ex-Partner: Not on file  . Emotionally Abused: Not on file  . Physically Abused: Not on file  . Sexually Abused: Not on file      PHYSICAL EXAM  There were  no vitals filed for this visit. There is no height or weight on file to calculate BMI.  Generalized: Well developed, in no acute distress  Cardiology: normal rate and rhythm, no murmur noted Neurological examination  Mentation: Alert oriented to time, place, history taking. Follows all commands speech and language fluent Cranial nerve II-XII: Pupils were equal round reactive to  light. Extraocular movements were full, visual field were full on confrontational test. Facial sensation and strength were normal. Uvula tongue midline. Head turning and shoulder shrug  were normal and symmetric. Motor: The motor testing reveals 5 over 5 strength of all 4 extremities. Good symmetric motor tone is noted throughout.  Sensory: Sensory testing is intact to soft touch on all 4 extremities. No evidence of extinction is noted.  Coordination: Cerebellar testing reveals good finger-nose-finger and heel-to-shin bilaterally.  Gait and station: Gait is normal. Tandem gait is normal. Romberg is negative. No drift is seen.  Reflexes: Deep tendon reflexes are symmetric and normal bilaterally.   DIAGNOSTIC DATA (LABS, IMAGING, TESTING) - I reviewed patient records, labs, notes, testing and imaging myself where available.  No flowsheet data found.   Lab Results  Component Value Date   WBC 6.1 12/02/2018   HGB 11.8 (L) 12/02/2018   HCT 38.4 12/02/2018   MCV 97.0 12/02/2018   PLT 242 12/02/2018      Component Value Date/Time   NA 138 12/04/2018 0745   K 4.1 12/04/2018 0745   CL 100 12/04/2018 0745   CO2 30 12/04/2018 0745   GLUCOSE 124 (H) 12/04/2018 0745   BUN 16 12/04/2018 0745   CREATININE 0.73 12/04/2018 0745   CALCIUM 9.2 12/04/2018 0745   PROT 7.2 11/02/2018 0121   ALBUMIN 3.3 (L) 11/02/2018 0121   AST 15 11/02/2018 0121   ALT 24 11/02/2018 0121   ALKPHOS 70 11/02/2018 0121   BILITOT 0.5 11/02/2018 0121   GFRNONAA >60 12/04/2018 0745   GFRAA >60 12/04/2018 0745   Lab Results  Component Value  Date   CHOL 193 02/13/2011   HDL 50 02/13/2011   LDLCALC 120 (H) 02/13/2011   TRIG 113 02/13/2011   CHOLHDL 3.9 Ratio 02/13/2011   Lab Results  Component Value Date   HGBA1C 5.7 (H) 10/26/2018   No results found for: VITAMINB12 Lab Results  Component Value Date   TSH 0.080 (L) 03/17/2010       ASSESSMENT AND PLAN 64 y.o. year old female  has a past medical history of Cancer ([place]), Depression, gastric ca, HIV (human immunodeficiency virus infection) ([place]), Hypertension, Migraine, and Schizophrenia ([place]). here with ***  No diagnosis found.     No orders of the defined types were placed in this encounter.    No orders of the defined types were placed in this encounter.     I spent 15 minutes with the patient. 50% of this time was spent counseling and educating patient on plan of care and medications.    Debbora Presto, FNP-C 01/28/2020, 12:34 PM [facility] [address], [place], [place] [address] [phone]

## 2020-02-10 ENCOUNTER — Other Ambulatory Visit: Payer: Self-pay

## 2020-02-10 ENCOUNTER — Encounter: Payer: Self-pay | Admitting: Family Medicine

## 2020-02-10 ENCOUNTER — Ambulatory Visit (INDEPENDENT_AMBULATORY_CARE_PROVIDER_SITE_OTHER): Payer: Medicare Other | Admitting: Family Medicine

## 2020-02-10 VITALS — BP 128/80 | HR 81 | Temp 96.0°F | Ht 70.0 in | Wt 272.0 lb

## 2020-02-10 DIAGNOSIS — F200 Paranoid schizophrenia: Secondary | ICD-10-CM

## 2020-02-10 DIAGNOSIS — R251 Tremor, unspecified: Secondary | ICD-10-CM | POA: Diagnosis not present

## 2020-02-10 NOTE — Patient Instructions (Signed)
  We will continue to monitor for now.   Follow up as needed   Tremor A tremor is trembling or shaking that you cannot control. Most tremors affect the hands or arms. Tremors can also affect the head, vocal cords, face, and other parts of the body. There are many types of tremors. Common types include:  Essential tremor. These usually occur in people older than 40. It may run in families and can happen in otherwise healthy people.  Resting tremor. These occur when the muscles are at rest, such as when your hands are resting in your lap. People with Parkinson's disease often have resting tremors.  Postural tremor. These occur when you try to hold a pose, such as keeping your hands outstretched.  Kinetic tremor. These occur during purposeful movement, such as trying to touch a finger to your nose.  Task-specific tremor. These may occur when you perform certain tasks such as writing, speaking, or standing.  Psychogenic tremor. These dramatically lessen or disappear when you are distracted. They can happen in people of all ages. Some types of tremors have no known cause. Tremors can also be a symptom of nervous system problems (neurological disorders) that may occur with aging. Some tremors go away with treatment, while others do not. Follow these instructions at home: Lifestyle      Limit alcohol intake to no more than 1 drink a day for nonpregnant women and 2 drinks a day for men. One drink equals 12 oz of beer, 5 oz of wine, or 1 oz of hard liquor.  Do not use any products that contain nicotine or tobacco, such as cigarettes and e-cigarettes. If you need help quitting, ask your health care provider.  Avoid extreme heat and extreme cold.  Limit your caffeine intake, as told by your health care provider.  Try to get 8 hours of sleep each night.  Find ways to manage your stress, such as meditation or yoga. General instructions  Take over-the-counter and prescription medicines only  as told by your health care provider.  Keep all follow-up visits as told by your health care provider. This is important. Contact a health care provider if you:  Develop a tremor after starting a new medicine.  Have a tremor along with other symptoms such as: ? Numbness. ? Tingling. ? Pain. ? Weakness.  Notice that your tremor gets worse.  Notice that your tremor interferes with your day-to-day life. Summary  A tremor is trembling or shaking that you cannot control.  Most tremors affect the hands or arms.  Some types of tremors have no known cause. Others may be a symptom of nervous system problems (neurological disorders).  Make sure you discuss any tremors you have with your health care provider. This information is not intended to replace advice given to you by your health care provider. Make sure you discuss any questions you have with your health care provider. Document Revised: 11/29/2017 Document Reviewed: 10/17/2017 Elsevier Patient Education  2020 [employer].

## 2020-02-10 NOTE — Progress Notes (Signed)
PATIENT: Diane Sutton DOB: 11/04/1956  REASON FOR VISIT: follow up HISTORY FROM: patient  Chief Complaint  Patient presents with  . Follow-up    3 mon f/u. Alone. [place] room. Patient mentioned that she doesn't shake as much anymore she still does shake some.      HISTORY OF PRESENT ILLNESS: Today 02/11/20 Diane Sutton is a 64 y.o. female here today for follow up for tremors and headaches. She has decreased Seroquel dose to 100mg  daily. She was seen by psychiatry last week. She feels that tremor and headaches are better. She is living in a hotel right now while trying to find a permanent home.    HISTORY: (copied from Dr Garth Bigness note on 08/10/2019)  I had the pleasure of seeing your patient, Diane Sutton, at [facility] for neurologic consultation regarding her tremors and history of headaches.  She is a 64 yo woman who has noted a tremor with her head shaking and sometimes a sensation like her torso is shaking.   She also notes a tremor in her hands.   Tremors started a couple years ago but gradually worsened this year.  She notes them more when she is sitting down and less when she is walking.   They are worse when she is holding an item or writing.   Tremors do not change with emotion.   She also feels she is fidgeting more   She has used a cane since last year due to shaking she states.   She feels her balance is worse.   She had a fall earlier this year when she stumbled over a curb.    She notes no change in bladder function.   She has noted some numbness in her legs.   She was hearing voices and was placed on Seroquel at bedtime many years ago.  Her dose was upped from 200 mg to 300 mg two years ago.  It has helped and she no longer hear voices and she is sleeping better.   At various times, she has been diagnosed with either paranoid schizophrenia or schizoaffective disorder.  She sees Gerrie Nordmann at [facility].  Of note, in 2012 her dose of Seroquel  was 25 mg.  She used to live with her sister but is currently living in a hotel.  She used to have a lot of headaches but now has very few.  When the headaches were present, they were pounding and usually bilateral.     She reports she was HIV positive but not now.   The HIV screen 9 months ago was negative.  HIV1 RNA not detected.    CD4 count was 390.  She has COPD and is on inhalers.  She smoked for several years when she was younger.   REVIEW OF SYSTEMS: Out of a complete 14 system review of symptoms, the patient complains only of the following symptoms, none and all other reviewed systems are negative.  ALLERGIES: No Known Allergies  HOME MEDICATIONS: Outpatient Medications Prior to Visit  Medication Sig Dispense Refill  . cetirizine (ZYRTEC) 10 MG tablet Take 10 mg by mouth daily.    . hydrochlorothiazide (MICROZIDE) 12.5 MG capsule Take 12.5 mg by mouth daily.   4  . ipratropium-albuterol (DUONEB) 0.5-2.5 (3) MG/3ML SOLN Take 3 mLs by nebulization every 6 (six) hours as needed. (Patient taking differently: Take 3 mLs by nebulization every 6 (six) hours as needed (wheezing). ) 360 mL 0  . PROAIR HFA 108 (90  Base) MCG/ACT inhaler Take 1 puff by mouth every 6 (six) hours as needed for shortness of breath.  3  . QUEtiapine (SEROQUEL) 100 MG tablet Take 100 mg by mouth at bedtime.    [name] QUEtiapine (SEROQUEL) 200 MG tablet Take 1 tablet (200 mg total) by mouth at bedtime. (Patient taking differently: Take 300 mg by mouth at bedtime. ) 30 tablet 0  . escitalopram (LEXAPRO) 20 MG tablet Take 1 tablet (20 mg total) by mouth daily for 14 days. (Patient taking differently: Take 20 mg by mouth daily. ) 30 tablet 0   No facility-administered medications prior to visit.    PAST MEDICAL HISTORY: Past Medical History:  Diagnosis Date  . Cancer ([place])   . Depression   . gastric ca   . HIV (human immunodeficiency virus infection) ([place])    positive-non active  . Hypertension   . Migraine   .  Schizophrenia ([place])     PAST SURGICAL HISTORY: Past Surgical History:  Procedure Laterality Date  . ABDOMINAL HYSTERECTOMY    . ANKLE SURGERY  2004   orif left  . FINGER SURGERY  2004   i/d lt middle finger  . STRABISMUS SURGERY Left 12/11/2007  . STRABISMUS SURGERY Bilateral 01/22/2014   Procedure: REPAIR STRABISMUS BILATERAL EYES;  Surgeon: Derry Skill, MD;  Location: [facility];  Service: Ophthalmology;  Laterality: Bilateral;  . THYROID SURGERY     needle tx    FAMILY HISTORY: Family History  Problem Relation Age of Onset  . Heart disease Mother   . Colon cancer Neg Hx     SOCIAL HISTORY: Social History   Socioeconomic History  . Marital status: Single    Spouse name: Not on file  . Number of children: 0  . Years of education: 58  . Highest education level: Not on file  Occupational History  . Occupation: unemployed  Tobacco Use  . Smoking status: Never Smoker  . Smokeless tobacco: Former Network engineer and Sexual Activity  . Alcohol use: No  . Drug use: No  . Sexual activity: Not Currently  Other Topics Concern  . Not on file  Social History Narrative   Pt from shelter with increasing cough and shortness of breath.   Soda sometimes   Lives with sister   Right handed   Social Determinants of Health   Financial Resource Strain:   . Difficulty of Paying Living Expenses: Not on file  Food Insecurity:   . Worried About Charity fundraiser in the Last Year: Not on file  . Ran Out of Food in the Last Year: Not on file  Transportation Needs:   . Lack of Transportation (Medical): Not on file  . Lack of Transportation (Non-Medical): Not on file  Physical Activity:   . Days of Exercise per Week: Not on file  . Minutes of Exercise per Session: Not on file  Stress:   . Feeling of Stress : Not on file  Social Connections:   . Frequency of Communication with Friends and Family: Not on file  . Frequency of Social Gatherings with Friends and  Family: Not on file  . Attends Religious Services: Not on file  . Active Member of Clubs or Organizations: Not on file  . Attends Archivist Meetings: Not on file  . Marital Status: Not on file  Intimate Partner Violence:   . Fear of Current or Ex-Partner: Not on file  . Emotionally Abused: Not on file  .  Physically Abused: Not on file  . Sexually Abused: Not on file      PHYSICAL EXAM  Vitals:   02/10/20 1438  BP: 128/80  Pulse: 81  Temp: (!) 96 F (35.6 C)  TempSrc: Oral  Weight: 272 lb (123.4 kg)  Height: 5\' 10"  (1.778 m)   Body mass index is 39.03 kg/m.  Generalized: Well developed, in no acute distress  Cardiology: normal rate and rhythm, no murmur noted Neurological examination  Mentation: Alert oriented to time, place, history taking. Follows all commands speech and language fluent Cranial nerve II-XII: Pupils were equal round reactive to light. Extraocular movements were full, visual field were full on confrontational test. Facial sensation and strength were normal. Uvula tongue midline. Head turning and shoulder shrug  were normal and symmetric. Motor: The motor testing reveals 5 over 5 strength of all 4 extremities. Good symmetric motor tone is noted throughout.  Sensory: Sensory testing is intact to soft touch on all 4 extremities. No evidence of extinction is noted.  Coordination: Cerebellar testing reveals good finger-nose-finger and heel-to-shin bilaterally.  Gait and station: Gait is normal.   DIAGNOSTIC DATA (LABS, IMAGING, TESTING) - I reviewed patient records, labs, notes, testing and imaging myself where available.  No flowsheet data found.   Lab Results  Component Value Date   WBC 6.1 12/02/2018   HGB 11.8 (L) 12/02/2018   HCT 38.4 12/02/2018   MCV 97.0 12/02/2018   PLT 242 12/02/2018      Component Value Date/Time   NA 138 12/04/2018 0745   K 4.1 12/04/2018 0745   CL 100 12/04/2018 0745   CO2 30 12/04/2018 0745   GLUCOSE 124 (H)  12/04/2018 0745   BUN 16 12/04/2018 0745   CREATININE 0.73 12/04/2018 0745   CALCIUM 9.2 12/04/2018 0745   PROT 7.2 11/02/2018 0121   ALBUMIN 3.3 (L) 11/02/2018 0121   AST 15 11/02/2018 0121   ALT 24 11/02/2018 0121   ALKPHOS 70 11/02/2018 0121   BILITOT 0.5 11/02/2018 0121   GFRNONAA >60 12/04/2018 0745   GFRAA >60 12/04/2018 0745   Lab Results  Component Value Date   CHOL 193 02/13/2011   HDL 50 02/13/2011   LDLCALC 120 (H) 02/13/2011   TRIG 113 02/13/2011   CHOLHDL 3.9 Ratio 02/13/2011   Lab Results  Component Value Date   HGBA1C 5.7 (H) 10/26/2018   No results found for: VITAMINB12 Lab Results  Component Value Date   TSH 0.080 (L) 03/17/2010     ASSESSMENT AND PLAN 64 y.o. year old female  has a past medical history of Cancer ([place]), Depression, gastric ca, HIV (human immunodeficiency virus infection) ([place]), Hypertension, Migraine, and Schizophrenia ([place]). here with     ICD-10-CM   1. Tremor  R25.1   2. Paranoid schizophrenia ([place])  F20.0     [place] does feel that tremor has improved since decreasing Seroquel. She continues follow up with psychiatry. She will continue activity as tolerated. She will follow up with [place] as needed.    No orders of the defined types were placed in this encounter.    No orders of the defined types were placed in this encounter.     I spent 15 minutes with the patient. 50% of this time was spent counseling and educating patient on plan of care and medications.    Debbora Presto, FNP-C 02/11/2020, 4:00 PM [facility] [address] [place], [place] [address] [phone]

## 2020-02-11 ENCOUNTER — Encounter: Payer: Self-pay | Admitting: Family Medicine

## 2020-02-11 NOTE — Progress Notes (Signed)
I have read the note, and I agree with the clinical assessment and plan.  Shristi Scheib A. Josejulian Tarango, MD, PhD, FAAN Certified in Neurology, Clinical Neurophysiology, Sleep Medicine, Pain Medicine and Neuroimaging  [facility] [address], [address] [place], [place] [address] [phone]  

## 2020-04-14 ENCOUNTER — Ambulatory Visit: Payer: Medicare Other | Admitting: Family Medicine

## 2020-11-11 ENCOUNTER — Emergency Department (HOSPITAL_COMMUNITY)
Admission: EM | Admit: 2020-11-11 | Discharge: 2020-11-11 | Disposition: A | Discharge status: Home / Self Care | Payer: Medicare Other | Attending: Emergency Medicine | Admitting: Emergency Medicine

## 2020-11-11 ENCOUNTER — Encounter (HOSPITAL_COMMUNITY): Payer: Self-pay | Admitting: Emergency Medicine

## 2020-11-11 ENCOUNTER — Other Ambulatory Visit: Payer: Self-pay

## 2020-11-11 DIAGNOSIS — Z5321 Procedure and treatment not carried out due to patient leaving prior to being seen by health care provider: Secondary | ICD-10-CM | POA: Insufficient documentation

## 2020-11-11 DIAGNOSIS — R102 Pelvic and perineal pain: Secondary | ICD-10-CM | POA: Diagnosis not present

## 2020-11-11 DIAGNOSIS — R21 Rash and other nonspecific skin eruption: Secondary | ICD-10-CM | POA: Diagnosis not present

## 2020-11-11 NOTE — ED Notes (Signed)
Called pt to be roomed x3. No response from pt.

## 2020-11-11 NOTE — ED Triage Notes (Signed)
Pt reports "rash" with peeling, painful skin to her groin area. Reports hx of the same for which she used a cream on with improvement, however, she is out of cream.

## 2021-04-07 ENCOUNTER — Encounter (HOSPITAL_COMMUNITY): Payer: Self-pay

## 2021-04-07 ENCOUNTER — Emergency Department (HOSPITAL_COMMUNITY)
Admission: EM | Admit: 2021-04-07 | Discharge: 2021-04-08 | Disposition: A | Discharge status: Home / Self Care | Payer: Medicare Other | Attending: Emergency Medicine | Admitting: Emergency Medicine

## 2021-04-07 ENCOUNTER — Emergency Department (HOSPITAL_COMMUNITY): Payer: Medicare Other

## 2021-04-07 ENCOUNTER — Other Ambulatory Visit: Payer: Self-pay

## 2021-04-07 DIAGNOSIS — J449 Chronic obstructive pulmonary disease, unspecified: Secondary | ICD-10-CM | POA: Insufficient documentation

## 2021-04-07 DIAGNOSIS — K8020 Calculus of gallbladder without cholecystitis without obstruction: Secondary | ICD-10-CM | POA: Insufficient documentation

## 2021-04-07 DIAGNOSIS — R079 Chest pain, unspecified: Secondary | ICD-10-CM | POA: Diagnosis not present

## 2021-04-07 DIAGNOSIS — Z21 Asymptomatic human immunodeficiency virus [HIV] infection status: Secondary | ICD-10-CM | POA: Diagnosis not present

## 2021-04-07 DIAGNOSIS — Z85028 Personal history of other malignant neoplasm of stomach: Secondary | ICD-10-CM | POA: Insufficient documentation

## 2021-04-07 DIAGNOSIS — R109 Unspecified abdominal pain: Secondary | ICD-10-CM | POA: Diagnosis present

## 2021-04-07 DIAGNOSIS — R0602 Shortness of breath: Secondary | ICD-10-CM | POA: Diagnosis not present

## 2021-04-07 DIAGNOSIS — I10 Essential (primary) hypertension: Secondary | ICD-10-CM | POA: Insufficient documentation

## 2021-04-07 DIAGNOSIS — Z79899 Other long term (current) drug therapy: Secondary | ICD-10-CM | POA: Diagnosis not present

## 2021-04-07 DIAGNOSIS — Z87891 Personal history of nicotine dependence: Secondary | ICD-10-CM | POA: Diagnosis not present

## 2021-04-07 LAB — COMPREHENSIVE METABOLIC PANEL
ALT: 23 U/L (ref 0–44)
AST: 23 U/L (ref 15–41)
Albumin: 4 g/dL (ref 3.5–5.0)
Alkaline Phosphatase: 71 U/L (ref 38–126)
Anion gap: 7 (ref 5–15)
BUN: 17 mg/dL (ref 8–23)
CO2: 27 mmol/L (ref 22–32)
Calcium: 9.1 mg/dL (ref 8.9–10.3)
Chloride: 103 mmol/L (ref 98–111)
Creatinine, Ser: 0.97 mg/dL (ref 0.44–1.00)
GFR, Estimated: >60 mL/min (ref >60)
Glucose, Bld: 141 mg/dL — ABNORMAL HIGH (ref 70–99)
Potassium: 3.7 mmol/L (ref 3.5–5.1)
Sodium: 137 mmol/L (ref 135–145)
Total Bilirubin: 0.7 mg/dL (ref 0.3–1.2)
Total Protein: 8 g/dL (ref 6.5–8.1)

## 2021-04-07 LAB — CBC
HCT: 42.2 % (ref 36.0–46.0)
Hemoglobin: 13.3 g/dL (ref 12.0–15.0)
MCH: 31.2 pg (ref 26.0–34.0)
MCHC: 31.5 g/dL (ref 30.0–36.0)
MCV: 99.1 fL (ref 80.0–100.0)
Platelets: 263 10*3/uL (ref 150–400)
RBC: 4.26 MIL/uL (ref 3.87–5.11)
RDW: 13 % (ref 11.5–15.5)
WBC: 6.1 10*3/uL (ref 4.0–10.5)
nRBC: 0 % (ref 0.0–0.2)

## 2021-04-07 LAB — URINALYSIS, ROUTINE W REFLEX MICROSCOPIC
Bilirubin Urine: NEGATIVE
Glucose, UA: NEGATIVE mg/dL
Hgb urine dipstick: NEGATIVE
Ketones, ur: NEGATIVE mg/dL
Leukocytes,Ua: NEGATIVE
Nitrite: NEGATIVE
Protein, ur: NEGATIVE mg/dL
Specific Gravity, Urine: 1.019 (ref 1.005–1.030)
pH: 9 — ABNORMAL HIGH (ref 5.0–8.0)

## 2021-04-07 LAB — LIPASE, BLOOD: Lipase: 25 U/L (ref 11–51)

## 2021-04-07 LAB — TROPONIN I (HIGH SENSITIVITY): Troponin I (High Sensitivity): 4 ng/L (ref <18)

## 2021-04-07 MED ORDER — MORPHINE SULFATE (PF) 4 MG/ML IV SOLN
4.0000 mg | Freq: Once | INTRAVENOUS | Status: AC
  Administered 2021-04-08: 4 mg via INTRAVENOUS
  Filled 2021-04-07: qty 1

## 2021-04-07 MED ORDER — AEROCHAMBER PLUS FLO-VU MEDIUM MISC
1.0000 | Freq: Once | Status: AC
  Administered 2021-04-08: 1
  Filled 2021-04-07 (×2): qty 1

## 2021-04-07 MED ORDER — ALBUTEROL SULFATE HFA 108 (90 BASE) MCG/ACT IN AERS
2.0000 | INHALATION_SPRAY | RESPIRATORY_TRACT | Status: DC | PRN
  Filled 2021-04-07: qty 6.7
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
## 2021-04-08 ENCOUNTER — Emergency Department (HOSPITAL_COMMUNITY): Payer: Medicare Other

## 2021-04-08 ENCOUNTER — Encounter (HOSPITAL_COMMUNITY): Payer: Self-pay

## 2021-04-08 DIAGNOSIS — K8020 Calculus of gallbladder without cholecystitis without obstruction: Secondary | ICD-10-CM | POA: Diagnosis not present

## 2021-04-08 LAB — TROPONIN I (HIGH SENSITIVITY): Troponin I (High Sensitivity): <2 ng/L (ref <18)

## 2021-04-08 MED ORDER — MORPHINE SULFATE (PF) 4 MG/ML IV SOLN
4.0000 mg | Freq: Once | INTRAVENOUS | Status: AC
Start: 2021-04-08 — End: 2021-04-08

## 2021-04-08 MED ORDER — ONDANSETRON HCL 4 MG/2ML IJ SOLN
INTRAMUSCULAR | Status: AC
  Administered 2021-04-08: 4 mg via INTRAVENOUS
  Filled 2021-04-08: qty 2

## 2021-04-08 MED ORDER — IOHEXOL 300 MG/ML  SOLN
100.0000 mL | Freq: Once | INTRAMUSCULAR | Status: AC | PRN
  Administered 2021-04-08: 100 mL via INTRAVENOUS

## 2021-04-08 MED ORDER — MORPHINE SULFATE (PF) 4 MG/ML IV SOLN
INTRAVENOUS | Status: AC
  Administered 2021-04-08: 4 mg via INTRAVENOUS
  Filled 2021-04-08: qty 1

## 2021-04-08 MED ORDER — ONDANSETRON HCL 4 MG/2ML IJ SOLN: 4.0000 mg | Freq: Once | INTRAMUSCULAR | Status: AC

## 2021-04-08 NOTE — ED Notes (Signed)
Patient ambulatory to restroom without assistance. 

## 2021-04-08 NOTE — Discharge Instructions (Addendum)
1. Medications: usual home medications °2. Treatment: rest, drink plenty of fluids, advance diet slowly °3. Follow Up: Please followup with your primary doctor in 2 days for discussion of your diagnoses and further evaluation after today's visit; if you do not have a primary care doctor use the resource guide provided to find one; Please return to the ER for persistent vomiting, high fevers or worsening symptoms °

## 2021-04-09 ENCOUNTER — Emergency Department (HOSPITAL_COMMUNITY): Payer: Medicare Other

## 2021-04-09 ENCOUNTER — Inpatient Hospital Stay (HOSPITAL_COMMUNITY)
Admission: EM | Admit: 2021-04-09 | Discharge: 2021-04-13 | DRG: 854 | Disposition: A | Discharge status: Home Health | Payer: Medicare Other | Attending: Internal Medicine | Admitting: Internal Medicine

## 2021-04-09 ENCOUNTER — Encounter (HOSPITAL_COMMUNITY): Payer: Self-pay | Admitting: Student

## 2021-04-09 DIAGNOSIS — K82A2 Perforation of gallbladder in cholecystitis: Secondary | ICD-10-CM | POA: Diagnosis present

## 2021-04-09 DIAGNOSIS — A419 Sepsis, unspecified organism: Secondary | ICD-10-CM | POA: Diagnosis not present

## 2021-04-09 DIAGNOSIS — Z21 Asymptomatic human immunodeficiency virus [HIV] infection status: Secondary | ICD-10-CM | POA: Diagnosis not present

## 2021-04-09 DIAGNOSIS — Z8249 Family history of ischemic heart disease and other diseases of the circulatory system: Secondary | ICD-10-CM

## 2021-04-09 DIAGNOSIS — R7401 Elevation of levels of liver transaminase levels: Secondary | ICD-10-CM | POA: Diagnosis not present

## 2021-04-09 DIAGNOSIS — G4733 Obstructive sleep apnea (adult) (pediatric): Secondary | ICD-10-CM | POA: Diagnosis present

## 2021-04-09 DIAGNOSIS — Z8616 Personal history of COVID-19: Secondary | ICD-10-CM | POA: Diagnosis not present

## 2021-04-09 DIAGNOSIS — Z20822 Contact with and (suspected) exposure to covid-19: Secondary | ICD-10-CM | POA: Diagnosis present

## 2021-04-09 DIAGNOSIS — K82A1 Gangrene of gallbladder in cholecystitis: Secondary | ICD-10-CM | POA: Diagnosis not present

## 2021-04-09 DIAGNOSIS — Z79899 Other long term (current) drug therapy: Secondary | ICD-10-CM

## 2021-04-09 DIAGNOSIS — J45909 Unspecified asthma, uncomplicated: Secondary | ICD-10-CM | POA: Diagnosis present

## 2021-04-09 DIAGNOSIS — F200 Paranoid schizophrenia: Secondary | ICD-10-CM | POA: Diagnosis not present

## 2021-04-09 DIAGNOSIS — I10 Essential (primary) hypertension: Secondary | ICD-10-CM | POA: Diagnosis not present

## 2021-04-09 DIAGNOSIS — R7989 Other specified abnormal findings of blood chemistry: Secondary | ICD-10-CM | POA: Diagnosis not present

## 2021-04-09 DIAGNOSIS — K819 Cholecystitis, unspecified: Secondary | ICD-10-CM

## 2021-04-09 DIAGNOSIS — J811 Chronic pulmonary edema: Secondary | ICD-10-CM | POA: Diagnosis not present

## 2021-04-09 DIAGNOSIS — Z90710 Acquired absence of both cervix and uterus: Secondary | ICD-10-CM

## 2021-04-09 DIAGNOSIS — K810 Acute cholecystitis: Secondary | ICD-10-CM | POA: Diagnosis present

## 2021-04-09 DIAGNOSIS — Z6841 Body Mass Index (BMI) 40.0 and over, adult: Secondary | ICD-10-CM

## 2021-04-09 DIAGNOSIS — Z87891 Personal history of nicotine dependence: Secondary | ICD-10-CM | POA: Diagnosis not present

## 2021-04-09 DIAGNOSIS — K8012 Calculus of gallbladder with acute and chronic cholecystitis without obstruction: Secondary | ICD-10-CM | POA: Diagnosis present

## 2021-04-09 DIAGNOSIS — F32A Depression, unspecified: Secondary | ICD-10-CM | POA: Diagnosis not present

## 2021-04-09 DIAGNOSIS — R1011 Right upper quadrant pain: Secondary | ICD-10-CM

## 2021-04-09 LAB — COMPREHENSIVE METABOLIC PANEL
ALT: 31 U/L (ref 0–44)
AST: 32 U/L (ref 15–41)
Albumin: 3.4 g/dL — ABNORMAL LOW (ref 3.5–5.0)
Alkaline Phosphatase: 89 U/L (ref 38–126)
Anion gap: 9 (ref 5–15)
BUN: 13 mg/dL (ref 8–23)
CO2: 28 mmol/L (ref 22–32)
Calcium: 8.7 mg/dL — ABNORMAL LOW (ref 8.9–10.3)
Chloride: 98 mmol/L (ref 98–111)
Creatinine, Ser: 1.07 mg/dL — ABNORMAL HIGH (ref 0.44–1.00)
GFR, Estimated: 57 mL/min — ABNORMAL LOW (ref >60)
Glucose, Bld: 137 mg/dL — ABNORMAL HIGH (ref 70–99)
Potassium: 3.6 mmol/L (ref 3.5–5.1)
Sodium: 135 mmol/L (ref 135–145)
Total Bilirubin: 1.7 mg/dL — ABNORMAL HIGH (ref 0.3–1.2)
Total Protein: 7.8 g/dL (ref 6.5–8.1)

## 2021-04-09 LAB — CBC WITH DIFFERENTIAL/PLATELET
Abs Immature Granulocytes: 0.43 10*3/uL — ABNORMAL HIGH (ref 0.00–0.07)
Basophils Absolute: 0.1 10*3/uL (ref 0.0–0.1)
Basophils Relative: 0 %
Eosinophils Absolute: 0 10*3/uL (ref 0.0–0.5)
Eosinophils Relative: 0 %
HCT: 41.9 % (ref 36.0–46.0)
Hemoglobin: 13.3 g/dL (ref 12.0–15.0)
Immature Granulocytes: 2 %
Lymphocytes Relative: 4 %
Lymphs Abs: 0.9 10*3/uL (ref 0.7–4.0)
MCH: 30.9 pg (ref 26.0–34.0)
MCHC: 31.7 g/dL (ref 30.0–36.0)
MCV: 97.4 fL (ref 80.0–100.0)
Monocytes Absolute: 1.6 10*3/uL — ABNORMAL HIGH (ref 0.1–1.0)
Monocytes Relative: 7 %
Neutro Abs: 20.5 10*3/uL — ABNORMAL HIGH (ref 1.7–7.7)
Neutrophils Relative %: 87 %
Platelets: 249 10*3/uL (ref 150–400)
RBC: 4.3 MIL/uL (ref 3.87–5.11)
RDW: 13 % (ref 11.5–15.5)
WBC: 23.5 10*3/uL — ABNORMAL HIGH (ref 4.0–10.5)
nRBC: 0 % (ref 0.0–0.2)

## 2021-04-09 LAB — LIPASE, BLOOD: Lipase: 21 U/L (ref 11–51)

## 2021-04-09 LAB — RESP PANEL BY RT-PCR (FLU A&B, COVID) ARPGX2
Influenza A by PCR: NEGATIVE
Influenza B by PCR: NEGATIVE
SARS Coronavirus 2 by RT PCR: NEGATIVE

## 2021-04-09 MED ORDER — POLYETHYLENE GLYCOL 3350 17 G PO PACK: 17.0000 g | PACK | Freq: Every day | ORAL | Status: DC | PRN

## 2021-04-09 MED ORDER — PIPERACILLIN-TAZOBACTAM 3.375 G IVPB
3.3750 g | Freq: Three times a day (TID) | INTRAVENOUS | Status: DC
  Administered 2021-04-09 – 2021-04-12 (×9): 3.375 g via INTRAVENOUS
  Filled 2021-04-09 (×10): qty 50

## 2021-04-09 MED ORDER — ACETAMINOPHEN 325 MG PO TABS: 650.0000 mg | ORAL_TABLET | Freq: Four times a day (QID) | ORAL | Status: DC | PRN

## 2021-04-09 MED ORDER — CEFTRIAXONE SODIUM 2 G IJ SOLR
2.0000 g | Freq: Once | INTRAMUSCULAR | Status: AC
  Administered 2021-04-09: 2 g via INTRAVENOUS
  Filled 2021-04-09: qty 20

## 2021-04-09 MED ORDER — FENTANYL CITRATE (PF) 100 MCG/2ML IJ SOLN: 25.0000 ug | INTRAMUSCULAR | Status: DC | PRN

## 2021-04-09 MED ORDER — ACETAMINOPHEN 650 MG RE SUPP: 650.0000 mg | Freq: Four times a day (QID) | RECTAL | Status: DC | PRN

## 2021-04-09 MED ORDER — FENTANYL CITRATE (PF) 100 MCG/2ML IJ SOLN
25.0000 ug | Freq: Once | INTRAMUSCULAR | Status: AC
  Administered 2021-04-09: 25 ug via INTRAVENOUS
  Filled 2021-04-09: qty 2

## 2021-04-09 MED ORDER — IPRATROPIUM-ALBUTEROL 0.5-2.5 (3) MG/3ML IN SOLN
3.0000 mL | Freq: Four times a day (QID) | RESPIRATORY_TRACT | Status: DC | PRN
  Administered 2021-04-09 – 2021-04-10 (×2): 3 mL via RESPIRATORY_TRACT
  Filled 2021-04-09 (×2): qty 3

## 2021-04-09 MED ORDER — ACETAMINOPHEN 325 MG PO TABS
650.0000 mg | ORAL_TABLET | Freq: Four times a day (QID) | ORAL | Status: DC | PRN
  Administered 2021-04-09: 650 mg via ORAL
  Filled 2021-04-09: qty 2

## 2021-04-09 MED ORDER — SODIUM CHLORIDE 0.9 % IV BOLUS
500.0000 mL | Freq: Once | INTRAVENOUS | Status: AC
  Administered 2021-04-09: 500 mL via INTRAVENOUS

## 2021-04-09 MED ORDER — LACTATED RINGERS IV BOLUS
1000.0000 mL | Freq: Once | INTRAVENOUS | Status: AC
  Administered 2021-04-09: 1000 mL via INTRAVENOUS

## 2021-04-09 NOTE — Plan of Care (Signed)

## 2021-04-09 NOTE — ED Triage Notes (Signed)
Pt arrived to ED via EMS from home. Pt began with RUQ at 0730 this am, denies n,v,d. Pt was at [facility] on 4/8 with same pain and dx with gallstones. Pt states no medication were sent home with her. Pt report pain has increased and sharp

## 2021-04-09 NOTE — Progress Notes (Signed)
Called in regards to patient's red MEWS status. According to RN the patient came to [facility] with a red mews because of  Temp 102.4 BP 121/91 HR 114 Resp 23  Patient was resting comfortably in bed.  She states that she feels that her breathing is getting better.  She does not appear to be in any acute distress.

## 2021-04-09 NOTE — ED Provider Notes (Signed)
[facility] EMERGENCY DEPARTMENT Provider Note   CSN: [id number] Arrival date & time: 04/09/21  9622     History Chief Complaint  Patient presents with  . Abdominal Pain    Diane Sutton is a 65 y.o. female.  HPI     64 year old female presents today complaining of right upper quadrant pain with nausea.  Symptoms began on Friday.  She was seen at the [facility] emergency department and had evaluation there.  CT with enlarged gallbladder.  She had an ultrasound which showed cholelithiasis without evidence of acute cholecystitis.  Her symptoms improved just.  She was able to be discharged home.  Yesterday she felt well and had a cheeseburger last night around 8:00.  She awoke this morning with worsened pain and nausea.  She has not vomited.  She has had some cough and congestion which she attributes to her asthma.  She has previously had Covid and had 2 vaccines.  She endorses a history of high blood pressure.  Review of chart reveals HIV, depression, cancer, schizophrenia.  She reports that she is a patient at [facility]  Past Medical History:  Diagnosis Date  . Cancer ([place])   . Depression   . gastric ca   . HIV (human immunodeficiency virus infection) ([place])    positive-non active  . Hypertension   . Migraine   . Schizophrenia [facility])     Patient Active Problem List   Diagnosis Date Noted  . Gait disturbance 08/10/2019  . Tremor 08/10/2019  . Prolonged QT interval 12/02/2018  . Bronchitis 12/01/2018  . COPD with acute exacerbation ([place]) 11/13/2018  . Acute bronchitis 11/12/2018  . Acute respiratory failure ([place]) 10/24/2018  . HIV positive but "non-active"   . Paranoid schizophrenia ([place]) 02/05/2011  . SKIN RASH 10/04/2010  . ALLERGIC RHINITIS 05/02/2010  . CANDIDIASIS 04/05/2010  . THYROID NODULE, RIGHT 10/07/2009  . PNEUMONIA 09/23/2009  . CERVICAL RADICULOPATHY, LEFT 09/12/2009  . UTI 08/10/2009  . CHEST PAIN UNSPECIFIED  08/10/2009  . 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 INGROWING NAIL 10/05/2008  . PRURITUS, EARS 06/08/2008  . OTALGIA 06/02/2008  . VAGINITIS, BACTERIAL 03/31/2008  . ARTHRITIS, RIGHT KNEE 03/23/2008  . OTHER ABNORMAL BLOOD CHEMISTRY 01/29/2008  . GOITER, MULTINODULAR 08/29/2007  . HYPERTHYROIDISM 08/29/2007  . DEPRESSION 08/29/2007  . MIGRAINE, CHRONIC 08/29/2007  . STRABISMUS 08/29/2007  . AUDITORY HALLUCINATION 08/29/2007    Past Surgical History:  Procedure Laterality Date  . ABDOMINAL HYSTERECTOMY    . ANKLE SURGERY  2004   orif left  . FINGER SURGERY  2004   i/d lt middle finger  . STRABISMUS SURGERY Left 12/11/2007  . STRABISMUS SURGERY Bilateral 01/22/2014   Procedure: REPAIR STRABISMUS BILATERAL EYES;  Surgeon: Derry Skill, MD;  Location: [place];  Service: Ophthalmology;  Laterality: Bilateral;  . THYROID SURGERY     needle tx     OB History   No obstetric history on file.     Family History  Problem Relation Age of Onset  . Heart disease Mother   . Colon cancer Neg Hx     Social History   Tobacco Use  . Smoking status: Never Smoker  . Smokeless tobacco: Former Network engineer  . Vaping Use: Never used  Substance Use Topics  . Alcohol use: No  . Drug use: No    Home Medications Prior to Admission medications   Medication Sig Start Date End Date Taking? Authorizing Provider  cetirizine (ZYRTEC) 10 MG tablet Take 10 mg by mouth daily.    [provider]  hydrochlorothiazide (MICROZIDE) 12.5 MG capsule Take 12.5 mg by mouth daily.  04/04/18   [provider]  ipratropium-albuterol (DUONEB) 0.5-2.5 (3) MG/3ML SOLN Take 3 mLs by nebulization every 6 (six) hours as needed. Patient taking differently: Take 3 mLs by nebulization every 6 (six) hours as needed (wheezing). 10/26/18   Raiford Noble Latif, DO  meloxicam (MOBIC) 7.5 MG tablet Take 7.5 mg by mouth daily as needed for pain. 03/25/21   [provider]   PROAIR HFA 108 (90 Base) MCG/ACT inhaler Take 1 puff by mouth every 6 (six) hours as needed for shortness of breath. 10/08/18   [provider]  QUEtiapine (SEROQUEL) 100 MG tablet Take 100 mg by mouth at bedtime.    [provider]    Allergies    Patient has no known allergies.  Review of Systems   Review of Systems  Constitutional: Positive for appetite change.  HENT: Negative.   Eyes: Negative.   Respiratory: Positive for cough. Negative for shortness of breath.        Right upper quadrant pain worsens with deep breathing  Cardiovascular: Negative for chest pain.  Gastrointestinal: Positive for abdominal pain and nausea. Negative for vomiting.  Endocrine: Negative.   Musculoskeletal: Negative.   Skin: Negative.   Allergic/Immunologic: Negative.   Neurological: Negative.   All other systems reviewed and are negative.   Physical Exam Updated Vital Signs BP 115/81   Pulse (!) 103   Temp 98.3 F (36.8 C) (Oral)   Resp (!) 25   Ht 1.676 m (5\' 6" )   Wt 129.2 kg   SpO2 98%   BMI 45.97 kg/m   Physical Exam Vitals and nursing note reviewed.  Constitutional:      General: She is in acute distress.     Appearance: She is well-developed. She is obese. She is not ill-appearing.  HENT:     Head: Normocephalic.     Mouth/Throat:     Mouth: Mucous membranes are moist.  Eyes:     Extraocular Movements: Extraocular movements intact.  Cardiovascular:     Rate and Rhythm: Tachycardia present.     Heart sounds: Normal heart sounds.  Pulmonary:     Effort: Pulmonary effort is normal.  Abdominal:     General: Abdomen is protuberant. Bowel sounds are normal.     Palpations: Abdomen is soft.     Tenderness: There is abdominal tenderness in the right upper quadrant.  Skin:    General: Skin is warm and dry.     Capillary Refill: Capillary refill takes less than 2 seconds.  Neurological:     General: No focal deficit present.     Mental Status: She is alert.   Psychiatric:        Mood and Affect: Mood normal.     ED Results / Procedures / Treatments   Labs (all labs ordered are listed, but only abnormal results are displayed) Labs Reviewed  CBC WITH DIFFERENTIAL/PLATELET - Abnormal; Notable for the following components:      Result Value   WBC 23.5 (*)    Neutro Abs 20.5 (*)    Monocytes Absolute 1.6 (*)    Abs Immature Granulocytes 0.43 (*)    All other components within normal limits  COMPREHENSIVE METABOLIC PANEL - Abnormal; Notable for the following components:   Glucose, Bld 137 (*)    Creatinine, Ser 1.07 (*)  Calcium 8.7 (*)    Albumin 3.4 (*)    Total Bilirubin 1.7 (*)    GFR, Estimated 57 (*)    All other components within normal limits  RESP PANEL BY RT-PCR (FLU A&B, COVID) ARPGX2  LIPASE, BLOOD    EKG EKG Interpretation  Date/Time:  Sunday April 09 2021 10:09:57 EDT Ventricular Rate:  98 PR Interval:  136 QRS Duration: 87 QT Interval:  306 QTC Calculation: 391 R Axis:   47 Text Interpretation: Sinus rhythm Low voltage, precordial leads Borderline T abnormalities, anterior leads Confirmed by Pattricia Boss [phone]) on 04/09/2021 12:25:59 PM   Radiology CT ABDOMEN PELVIS W CONTRAST  Result Date: 04/08/2021 CLINICAL DATA:  Abdominal distension vomiting EXAM: CT ABDOMEN AND PELVIS WITH CONTRAST TECHNIQUE: Multidetector CT imaging of the abdomen and pelvis was performed using the standard protocol following bolus administration of intravenous contrast. CONTRAST:  154mL OMNIPAQUE IOHEXOL 300 MG/ML  SOLN COMPARISON:  CT 11/02/2018, chest x-Filipe [place] 04/07/2021 FINDINGS: Lower chest: Lung bases demonstrate minimal lower lobe bronchiectasis and scar. No acute consolidation or effusion. Cardiac size upper limits of normal Hepatobiliary: No focal hepatic abnormality or biliary dilatation. The gallbladder is distended. Questionable mild soft tissue stranding. Pancreas: Unremarkable. No pancreatic ductal dilatation or surrounding  inflammatory changes. Spleen: Normal in size without focal abnormality. Adrenals/Urinary Tract: Adrenal glands are unremarkable. Kidneys are normal, without renal calculi, focal lesion, or hydronephrosis. Bladder is unremarkable. Stomach/Bowel: Stomach is within normal limits. Appendix appears normal. No evidence of bowel wall thickening, distention, or inflammatory changes. Vascular/Lymphatic: No significant vascular findings are present. No enlarged abdominal or pelvic lymph nodes. Reproductive: Status post hysterectomy. No adnexal masses. Other: Negative for free air or free fluid Musculoskeletal: No acute or significant osseous findings. IMPRESSION: 1. Distended gallbladder with questionable mild soft tissue stranding. Suggest correlation with right upper quadrant ultrasound. 2. Otherwise no CT evidence for acute intra-abdominal or pelvic abnormality. Electronically Signed   By: Donavan Foil M.D.   On: 04/08/2021 02:26   US Abdomen Limited  Result Date: 04/08/2021 CLINICAL DATA:  Epigastric pain with abnormal gallbladder on CT. EXAM: ULTRASOUND ABDOMEN LIMITED RIGHT UPPER QUADRANT COMPARISON:  None. FINDINGS: Gallbladder: Shadowing, echogenic gallstones are seen within the gallbladder lumen. The largest measures approximately 1.0 cm. The gallbladder wall measures 3.2 mm in thickness. No sonographic Murphy sign noted by sonographer. Common bile duct: Diameter: 4.1 mm (limited in visualization secondary to the patient's body habitus). Liver: No focal lesion identified. Within normal limits in parenchymal echogenicity. Portal vein is patent on color Doppler imaging with normal direction of blood flow towards the liver. Other: Limited study secondary to the patient's body habitus. IMPRESSION: Cholelithiasis, without evidence of acute cholecystitis. Electronically Signed   By: Virgina Norfolk M.D.   On: 04/08/2021 03:32   DG Chest Port 1 View  Result Date: 04/09/2021 CLINICAL DATA:  Preoperative respiratory  evaluation. EXAM: PORTABLE CHEST 1 VIEW COMPARISON:  04/07/2021 FINDINGS: 1101 hours. Low lung volumes. The cardio pericardial silhouette is enlarged. There is pulmonary vascular congestion without overt pulmonary edema. Similar patchy opacity at the bases compatible with chronic atelectasis or scarring. The visualized bony structures of the thorax show no acute abnormality. Telemetry leads overlie the chest. IMPRESSION: Low volume film with vascular congestion. Similar patchy bibasilar opacities compatible with atelectasis or scarring. Electronically Signed   By: Misty Stanley M.D.   On: 04/09/2021 11:31   DG Chest Portable 1 View  Result Date: 04/07/2021 CLINICAL DATA:  Shortness of breath. EXAM: PORTABLE CHEST 1  VIEW COMPARISON:  01/07/2020 FINDINGS: Examination limited by clothing artifact. The cardiac silhouette, mediastinal and hilar contours are within normal limits and stable. Chronic basilar scarring changes. No definite infiltrates, effusions or edema. No pneumothorax. The bony thorax is intact. IMPRESSION: Chronic basilar scarring changes.  No acute pulmonary findings. Electronically Signed   By: Marijo Sanes M.D.   On: 04/07/2021 18:23    Procedures Procedures   Medications Ordered in ED Medications  fentaNYL (SUBLIMAZE) injection 25 mcg (25 mcg Intravenous Given 04/09/21 1006)  sodium chloride 0.9 % bolus 500 mL (0 mLs Intravenous Stopped 04/09/21 1107)  cefTRIAXone (ROCEPHIN) 2 g in sodium chloride 0.9 % 100 mL IVPB (2 g Intravenous New Bag/Given 04/09/21 1123)    ED Course  I have reviewed the triage vital signs and the nursing notes.  Pertinent labs & imaging results that were available during my care of the patient were reviewed by me and considered in my medical decision making (see chart for details).    MDM Rules/Calculators/A&P                          65 year old female morbidly obese, HIV positive, history of respiratory failure, COPD, presents today with worsening  abdominal pain with recent diagnosis of cholelithiasis.  Pain is in right upper quadrant and appears consistent with biliary colic.  Today she has a leukocytosis.  She is treated Rocephin here in the ED.  Surgery has been consulted and requests medicine consult for admission. Discussed with internal medicine teaching service team.  They will see for admission. Final Clinical Impression(s) / ED Diagnoses Final diagnoses:  Cholecystitis    Rx / DC Orders ED Discharge Orders    None       Pattricia Boss, MD 04/09/21 1248

## 2021-04-09 NOTE — ED Notes (Signed)
Patient transported to Ultrasound 
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
## 2021-04-09 NOTE — Consult Note (Addendum)
[facility] Surgery Consult Note  Truda Staub [birth year]  [id number].    Requesting MD: Jeanell Sparrow, MD Chief Complaint/Reason for Consult: cholecystitis   HPI Ms. Burna Atlas is a 65 y/o F with a PMH asthma and oxygen deficiency (previously on home O2 per patient but not anymore), OSA, HTN, HIV-positive who presented to [facility] with a cc abd pain. Patient had similar pain associated with vomiting that was evaluated in [place] on Friday 4/8 where she was diagnosed with symptomatic cholelithiasis and discharged home once her pain improved with instructions to follow up with [facility]. She returns today w/ RUQ and epigastric pain that started last night and persisted into today. Pain with occasional radiation to her back. Denies aggravating or alleviating factors. States she had a cheeseburger and applesauce for dinner. Denies fever, chills, nausea. Vomiting, CP, or urinary sxs. Reports a history of hysterectomy but denies other abdominal surgeries. Denies use of blood thinners. Denies tobacco use.   ED workup: WBC 23, AST/ALT/Alk phos/lipase are WNL, t.bili is 1.7, creatinine 1.07, COVID negative, RUQ U/S pending.    ROS: As above  Review of Systems  All other systems reviewed and are negative.  Family History  Problem Relation Age of Onset  . Heart disease Mother   . Colon cancer Neg Hx     Past Medical History:  Diagnosis Date  . Cancer ([place])   . Depression   . gastric ca   . HIV (human immunodeficiency virus infection) ([place])    positive-non active  . Hypertension   . Migraine   . Schizophrenia [facility])     Past Surgical History:  Procedure Laterality Date  . ABDOMINAL HYSTERECTOMY    . ANKLE SURGERY  2004   orif left  . FINGER SURGERY  2004   i/d lt middle finger  . STRABISMUS SURGERY Left 12/11/2007  . STRABISMUS SURGERY Bilateral 01/22/2014   Procedure: REPAIR STRABISMUS BILATERAL EYES;  Surgeon: Derry Skill, MD;  Location: [facility];  Service: Ophthalmology;   Laterality: Bilateral;  . THYROID SURGERY     needle tx    Social History:  reports that she has never smoked. She has quit using smokeless tobacco. She reports that she does not drink alcohol and does not use drugs.  Allergies: No Known Allergies  (Not in a hospital admission)   Blood pressure 115/81, pulse (!) 103, temperature 98.3 F (36.8 C), temperature source Oral, resp. rate (!) 25, height _0  (1.676 m), weight 129.2 kg, SpO2 98 %. Physical Exam: Constitutional: NAD; conversant; no deformities Eyes: Moist conjunctiva; no lid lag; anicteric; PERRL Neck: Trachea midline; no thyromegaly Lungs: Normal respiratory effort on nasal cannula; no tactile fremitus CV: RRR; no palpable thrills; no pitting edema GI: Abd soft, obese, TTP RUQ and epigastrium without guarding, no palpable hepatosplenomegaly MSK: symmetrical; no clubbing/cyanosis Psychiatric: Appropriate affect; alert and oriented x3 Lymphatic: No palpable cervical or axillary lymphadenopathy  Results for orders placed or performed during the hospital encounter of 04/09/21 (from the past 48 hour(s))  CBC with Differential/Platelet     Status: Abnormal   Collection Time: 04/09/21  9:54 AM  Result Value Ref Range   WBC 23.5 (H) 4.0 - 10.5 K/uL   RBC 4.30 3.87 - 5.11 MIL/uL   Hemoglobin 13.3 12.0 - 15.0 g/dL   HCT 41.9 36.0 - 46.0 %   MCV 97.4 80.0 - 100.0 fL   MCH 30.9 26.0 - 34.0 pg   MCHC 31.7 30.0 - 36.0 g/dL   RDW  13.0 11.5 - 15.5 %   Platelets 249 150 - 400 K/uL   nRBC 0.0 0.0 - 0.2 %   Neutrophils Relative % 87 %   Neutro Abs 20.5 (H) 1.7 - 7.7 K/uL   Lymphocytes Relative 4 %   Lymphs Abs 0.9 0.7 - 4.0 K/uL   Monocytes Relative 7 %   Monocytes Absolute 1.6 (H) 0.1 - 1.0 K/uL   Eosinophils Relative 0 %   Eosinophils Absolute 0.0 0.0 - 0.5 K/uL   Basophils Relative 0 %   Basophils Absolute 0.1 0.0 - 0.1 K/uL   Immature Granulocytes 2 %   Abs Immature Granulocytes 0.43 (H) 0.00 - 0.07 K/uL    Comment:  Performed at [facility] [address], [place], [place] [address]  Comprehensive metabolic panel     Status: Abnormal   Collection Time: 04/09/21  9:54 AM  Result Value Ref Range   Sodium 135 135 - 145 mmol/L   Potassium 3.6 3.5 - 5.1 mmol/L   Chloride 98 98 - 111 mmol/L   CO2 28 22 - 32 mmol/L   Glucose, Bld 137 (H) 70 - 99 mg/dL    Comment: Glucose reference range applies only to samples taken after fasting for at least 8 hours.   BUN 13 8 - 23 mg/dL   Creatinine, Ser 1.07 (H) 0.44 - 1.00 mg/dL   Calcium 8.7 (L) 8.9 - 10.3 mg/dL   Total Protein 7.8 6.5 - 8.1 g/dL   Albumin 3.4 (L) 3.5 - 5.0 g/dL   AST 32 15 - 41 U/L   ALT 31 0 - 44 U/L   Alkaline Phosphatase 89 38 - 126 U/L   Total Bilirubin 1.7 (H) 0.3 - 1.2 mg/dL   GFR, Estimated 57 (L) >60 mL/min    Comment: (NOTE) Calculated using the CKD-EPI Creatinine Equation (2021)    Anion gap 9 5 - 15    Comment: Performed at [facility], [place] [address], [place], [place] [address]  Lipase, blood     Status: None   Collection Time: 04/09/21  9:54 AM  Result Value Ref Range   Lipase 21 11 - 51 U/L    Comment: Performed at [facility] [address], [place], [place] [address]  Resp Panel by RT-PCR (Flu A&B, Covid) Nasopharyngeal Swab     Status: None   Collection Time: 04/09/21 10:04 AM   Specimen: Nasopharyngeal Swab; Nasopharyngeal(NP) swabs in vial transport medium  Result Value Ref Range   SARS Coronavirus 2 by RT PCR NEGATIVE NEGATIVE    Comment: (NOTE) SARS-CoV-2 target nucleic acids are NOT DETECTED.  The SARS-CoV-2 RNA is generally detectable in upper respiratory specimens during the acute phase of infection. The lowest concentration of SARS-CoV-2 viral copies this assay can detect is 138 copies/mL. A negative result does not preclude SARS-Cov-2 infection and should not be used as the sole basis for treatment or other patient management decisions. A negative result may occur with  improper  specimen collection/handling, submission of specimen other than nasopharyngeal swab, presence of viral mutation(s) within the areas targeted by this assay, and inadequate number of viral copies(<138 copies/mL). A negative result must be combined with clinical observations, patient history, and epidemiological information. The expected result is Negative.  Fact Sheet for Patients:  EntrepreneurPulse.com.au  Fact Sheet for Healthcare Providers:  IncredibleEmployment.be  This test is no t yet approved or cleared by the [place] FDA and  has been authorized for detection and/or  diagnosis of SARS-CoV-2 by FDA under an Emergency Use Authorization (EUA). This EUA will remain  in effect (meaning this test can be used) for the duration of the COVID-19 declaration under Section 564(b)(1) of the Act, 21 U.S.C.section 360bbb-3(b)(1), unless the authorization is terminated  or revoked sooner.       Influenza A by PCR NEGATIVE NEGATIVE   Influenza B by PCR NEGATIVE NEGATIVE    Comment: (NOTE) The Xpert Xpress SARS-CoV-2/FLU/RSV plus assay is intended as an aid in the diagnosis of influenza from Nasopharyngeal swab specimens and should not be used as a sole basis for treatment. Nasal washings and aspirates are unacceptable for Xpert Xpress SARS-CoV-2/FLU/RSV testing.  Fact Sheet for Patients: EntrepreneurPulse.com.au  Fact Sheet for Healthcare Providers: IncredibleEmployment.be  This test is not yet approved or cleared by the [place] FDA and has been authorized for detection and/or diagnosis of SARS-CoV-2 by FDA under an Emergency Use Authorization (EUA). This EUA will remain in effect (meaning this test can be used) for the duration of the COVID-19 declaration under Section 564(b)(1) of the Act, 21 U.S.C. section 360bbb-3(b)(1), unless the authorization is terminated or revoked.  Performed at [facility], [place] [address], [place], [place] [address]    CT ABDOMEN PELVIS W CONTRAST  Result Date: 04/08/2021 CLINICAL DATA:  Abdominal distension vomiting EXAM: CT ABDOMEN AND PELVIS WITH CONTRAST TECHNIQUE: Multidetector CT imaging of the abdomen and pelvis was performed using the standard protocol following bolus administration of intravenous contrast. CONTRAST:  169m OMNIPAQUE IOHEXOL 300 MG/ML  SOLN COMPARISON:  CT 11/02/2018, chest x-ray 04/07/2021 FINDINGS: Lower chest: Lung bases demonstrate minimal lower lobe bronchiectasis and scar. No acute consolidation or effusion. Cardiac size upper limits of normal Hepatobiliary: No focal hepatic abnormality or biliary dilatation. The gallbladder is distended. Questionable mild soft tissue stranding. Pancreas: Unremarkable. No pancreatic ductal dilatation or surrounding inflammatory changes. Spleen: Normal in size without focal abnormality. Adrenals/Urinary Tract: Adrenal glands are unremarkable. Kidneys are normal, without renal calculi, focal lesion, or hydronephrosis. Bladder is unremarkable. Stomach/Bowel: Stomach is within normal limits. Appendix appears normal. No evidence of bowel wall thickening, distention, or inflammatory changes. Vascular/Lymphatic: No significant vascular findings are present. No enlarged abdominal or pelvic lymph nodes. Reproductive: Status post hysterectomy. No adnexal masses. Other: Negative for free air or free fluid Musculoskeletal: No acute or significant osseous findings. IMPRESSION: 1. Distended gallbladder with questionable mild soft tissue stranding. Suggest correlation with right upper quadrant ultrasound. 2. Otherwise no CT evidence for acute intra-abdominal or pelvic abnormality. Electronically Signed   By: KDonavan FoilM.D.   On: 04/08/2021 02:26   UKoreaAbdomen Limited  Result Date: 04/08/2021 CLINICAL DATA:  Epigastric pain with abnormal gallbladder on CT. EXAM: ULTRASOUND ABDOMEN LIMITED RIGHT UPPER QUADRANT  COMPARISON:  None. FINDINGS: Gallbladder: Shadowing, echogenic gallstones are seen within the gallbladder lumen. The largest measures approximately 1.0 cm. The gallbladder wall measures 3.2 mm in thickness. No sonographic Murphy sign noted by sonographer. Common bile duct: Diameter: 4.1 mm (limited in visualization secondary to the patient's body habitus). Liver: No focal lesion identified. Within normal limits in parenchymal echogenicity. Portal vein is patent on color Doppler imaging with normal direction of blood flow towards the liver. Other: Limited study secondary to the patient's body habitus. IMPRESSION: Cholelithiasis, without evidence of acute cholecystitis. Electronically Signed   By: TVirgina NorfolkM.D.   On: 04/08/2021 03:32   DG Chest Port 1 View  Result Date: 04/09/2021 CLINICAL DATA:  Preoperative respiratory evaluation. EXAM:  PORTABLE CHEST 1 VIEW COMPARISON:  04/07/2021 FINDINGS: 1101 hours. Low lung volumes. The cardio pericardial silhouette is enlarged. There is pulmonary vascular congestion without overt pulmonary edema. Similar patchy opacity at the bases compatible with chronic atelectasis or scarring. The visualized bony structures of the thorax show no acute abnormality. Telemetry leads overlie the chest. IMPRESSION: Low volume film with vascular congestion. Similar patchy bibasilar opacities compatible with atelectasis or scarring. Electronically Signed   By: Misty [place] M.D.   On: 04/09/2021 11:31   DG Chest Portable 1 View  Result Date: 04/07/2021 CLINICAL DATA:  Shortness of breath. EXAM: PORTABLE CHEST 1 VIEW COMPARISON:  01/07/2020 FINDINGS: Examination limited by clothing artifact. The cardiac silhouette, mediastinal and hilar contours are within normal limits and stable. Chronic basilar scarring changes. No definite infiltrates, effusions or edema. No pneumothorax. The bony thorax is intact. IMPRESSION: Chronic basilar scarring changes.  No acute pulmonary findings.  Electronically Signed   By: Marijo Sanes M.D.   On: 04/07/2021 18:23      Assessment/Plan HIV Asthma HTN  Schizophrenia PMH COVID 19 infection 01/2020, not hospitalized   RUQ pain, likely acute calculous cholecystitis  - clinical history consistent with biliary colic, now with worsening pain and leukocytosis - RUQ ultrasound pending - recommend admission to the medical service given pulmonary history with current O2 requirement - will likely benefit from cholecystectomy this admission, will discuss timing of surgery with my attending.  - IV abx, CLD today ok, NPO after MN    Jill Alexanders, [facility] Please see Amion for pager number during day hours 7:00am-4:30pm 04/09/2021, 12:24 PM

## 2021-04-09 NOTE — Progress Notes (Signed)
   04/09/21 1647  Assess: MEWS Score  Temp (!) 101.8 F (38.8 C)  BP 139/84  Pulse Rate (!) 102  Resp (!) 22  Level of Consciousness Alert  SpO2 100 %  O2 Device Nasal Cannula  O2 Flow Rate (L/min) 2 L/min  Assess: MEWS Score  MEWS Temp 2  MEWS Systolic 0  MEWS Pulse 1  MEWS RR 1  MEWS LOC 0  MEWS Score 4  MEWS Score Color Red  Assess: if the MEWS score is Yellow or Red  Were vital signs taken at a resting state? Yes  Focused Assessment No change from prior assessment  Early Detection of Sepsis Score *See Row Information* Low  MEWS guidelines implemented *See Row Information* Yes  Treat  MEWS Interventions Administered prn meds/treatments  Pain Scale 0-10  Pain Score 4  Pain Type Acute pain  Pain Location Abdomen  Pain Orientation Right  Pain Radiating Towards Upper  Pain Descriptors / Indicators Aching  Pain Frequency Intermittent  Pain Onset On-going  Pain Intervention(s) Rest  Take Vital Signs  Increase Vital Sign Frequency  Red: Q 1hr X 4 then Q 4hr X 4, if remains red, continue Q 4hrs  Escalate  MEWS: Escalate Red: discuss with charge nurse/RN and provider, consider discussing with RRT  Notify: Charge Nurse/RN  Name of Charge Nurse/RN Notified Robin, RN  Date Charge Nurse/RN Notified 04/09/21  Time Charge Nurse/RN Notified 1710  Notify: Provider  Provider Name/Title Dr. Eleanora Neighbor. chen at bedside  Date Provider Notified 04/09/21  Time Provider Notified 1700  Notification Type Page  Notification Reason Other (Comment) (Pt on red mews upon admission)  Provider response See new orders;Other (Comment) (Dr. Bridgett Larsson came to bedside to examine pt)  Date of Provider Response 04/09/21  Time of Provider Response 1702  Notify: Rapid Response  Name of Rapid Response RN Notified Saralyn Pilar, RN  Date Rapid Response Notified 04/09/21  Time Rapid Response Notified 1800  Document  Patient Outcome Stabilized after interventions (Pt states she is comfortable at this  time, not in distress)  Progress note created (see row info) Yes    Received Pt on a stretcher, assisted to bed, alert and oriented x 4. VS taken and recorded, RN also noted that Pt had labored breathing after ambulation. Pt on nasal cannula O2 at 2L, saturation 98%. MD notified, received and carried out new orders. Dr. Bridgett Larsson came to bedside. Agricultural consultant and rapid response RN made aware.  Pt now resting comfortably in bed, on 2L O2, not in distress, no labored breathing. RN will continue to monitor and endorse accordingly to oncoming RN.

## 2021-04-10 ENCOUNTER — Observation Stay (HOSPITAL_COMMUNITY): Payer: Medicare Other | Admitting: Anesthesiology

## 2021-04-10 ENCOUNTER — Encounter (HOSPITAL_COMMUNITY)
Admission: EM | Disposition: A | Discharge status: Home Health | Payer: Self-pay | Source: Home / Self Care | Attending: Internal Medicine

## 2021-04-10 ENCOUNTER — Encounter (HOSPITAL_COMMUNITY): Payer: Self-pay | Admitting: Internal Medicine

## 2021-04-10 DIAGNOSIS — G4733 Obstructive sleep apnea (adult) (pediatric): Secondary | ICD-10-CM | POA: Diagnosis not present

## 2021-04-10 DIAGNOSIS — Z8249 Family history of ischemic heart disease and other diseases of the circulatory system: Secondary | ICD-10-CM | POA: Diagnosis not present

## 2021-04-10 DIAGNOSIS — Z90710 Acquired absence of both cervix and uterus: Secondary | ICD-10-CM | POA: Diagnosis not present

## 2021-04-10 DIAGNOSIS — K82A1 Gangrene of gallbladder in cholecystitis: Secondary | ICD-10-CM | POA: Diagnosis not present

## 2021-04-10 DIAGNOSIS — K810 Acute cholecystitis: Secondary | ICD-10-CM | POA: Diagnosis present

## 2021-04-10 DIAGNOSIS — Z21 Asymptomatic human immunodeficiency virus [HIV] infection status: Secondary | ICD-10-CM | POA: Diagnosis not present

## 2021-04-10 DIAGNOSIS — Z6841 Body Mass Index (BMI) 40.0 and over, adult: Secondary | ICD-10-CM | POA: Diagnosis not present

## 2021-04-10 DIAGNOSIS — J45909 Unspecified asthma, uncomplicated: Secondary | ICD-10-CM | POA: Diagnosis not present

## 2021-04-10 DIAGNOSIS — K82A2 Perforation of gallbladder in cholecystitis: Secondary | ICD-10-CM | POA: Diagnosis not present

## 2021-04-10 DIAGNOSIS — Z8616 Personal history of COVID-19: Secondary | ICD-10-CM | POA: Diagnosis not present

## 2021-04-10 DIAGNOSIS — A419 Sepsis, unspecified organism: Secondary | ICD-10-CM | POA: Diagnosis not present

## 2021-04-10 DIAGNOSIS — Z20822 Contact with and (suspected) exposure to covid-19: Secondary | ICD-10-CM | POA: Diagnosis not present

## 2021-04-10 DIAGNOSIS — J810 Acute pulmonary edema: Secondary | ICD-10-CM

## 2021-04-10 DIAGNOSIS — J811 Chronic pulmonary edema: Secondary | ICD-10-CM | POA: Diagnosis not present

## 2021-04-10 DIAGNOSIS — I10 Essential (primary) hypertension: Secondary | ICD-10-CM

## 2021-04-10 DIAGNOSIS — R6520 Severe sepsis without septic shock: Secondary | ICD-10-CM | POA: Diagnosis not present

## 2021-04-10 DIAGNOSIS — K8012 Calculus of gallbladder with acute and chronic cholecystitis without obstruction: Secondary | ICD-10-CM | POA: Diagnosis not present

## 2021-04-10 DIAGNOSIS — Z87891 Personal history of nicotine dependence: Secondary | ICD-10-CM | POA: Diagnosis not present

## 2021-04-10 DIAGNOSIS — F200 Paranoid schizophrenia: Secondary | ICD-10-CM | POA: Diagnosis not present

## 2021-04-10 DIAGNOSIS — R7989 Other specified abnormal findings of blood chemistry: Secondary | ICD-10-CM | POA: Diagnosis not present

## 2021-04-10 DIAGNOSIS — R7401 Elevation of levels of liver transaminase levels: Secondary | ICD-10-CM | POA: Diagnosis not present

## 2021-04-10 DIAGNOSIS — Z79899 Other long term (current) drug therapy: Secondary | ICD-10-CM | POA: Diagnosis not present

## 2021-04-10 DIAGNOSIS — F32A Depression, unspecified: Secondary | ICD-10-CM | POA: Diagnosis not present

## 2021-04-10 HISTORY — PX: CHOLECYSTECTOMY: SHX55

## 2021-04-10 LAB — CBC
HCT: 39.6 % (ref 36.0–46.0)
Hemoglobin: 12.6 g/dL (ref 12.0–15.0)
MCH: 31.3 pg (ref 26.0–34.0)
MCHC: 31.8 g/dL (ref 30.0–36.0)
MCV: 98.5 fL (ref 80.0–100.0)
Platelets: 207 10*3/uL (ref 150–400)
RBC: 4.02 MIL/uL (ref 3.87–5.11)
RDW: 13.2 % (ref 11.5–15.5)
WBC: 18.8 10*3/uL — ABNORMAL HIGH (ref 4.0–10.5)
nRBC: 0 % (ref 0.0–0.2)

## 2021-04-10 LAB — COMPREHENSIVE METABOLIC PANEL
ALT: 28 U/L (ref 0–44)
AST: 23 U/L (ref 15–41)
Albumin: 3 g/dL — ABNORMAL LOW (ref 3.5–5.0)
Alkaline Phosphatase: 87 U/L (ref 38–126)
Anion gap: 8 (ref 5–15)
BUN: 15 mg/dL (ref 8–23)
CO2: 27 mmol/L (ref 22–32)
Calcium: 8.5 mg/dL — ABNORMAL LOW (ref 8.9–10.3)
Chloride: 100 mmol/L (ref 98–111)
Creatinine, Ser: 0.98 mg/dL (ref 0.44–1.00)
GFR, Estimated: >60 mL/min (ref >60)
Glucose, Bld: 138 mg/dL — ABNORMAL HIGH (ref 70–99)
Potassium: 3.3 mmol/L — ABNORMAL LOW (ref 3.5–5.1)
Sodium: 135 mmol/L (ref 135–145)
Total Bilirubin: 2.3 mg/dL — ABNORMAL HIGH (ref 0.3–1.2)
Total Protein: 7.3 g/dL (ref 6.5–8.1)

## 2021-04-10 SURGERY — LAPAROSCOPIC CHOLECYSTECTOMY WITH INTRAOPERATIVE CHOLANGIOGRAM
Anesthesia: General | Site: Abdomen

## 2021-04-10 MED ORDER — MIDAZOLAM HCL 2 MG/2ML IJ SOLN
INTRAMUSCULAR | Status: DC | PRN
  Administered 2021-04-10: 1 mg via INTRAVENOUS

## 2021-04-10 MED ORDER — OXYCODONE HCL 5 MG PO TABS
5.0000 mg | ORAL_TABLET | ORAL | Status: DC | PRN
Start: 2021-04-10 — End: 2021-04-13

## 2021-04-10 MED ORDER — ALBUTEROL SULFATE HFA 108 (90 BASE) MCG/ACT IN AERS
2.0000 | INHALATION_SPRAY | RESPIRATORY_TRACT | Status: AC
  Administered 2021-04-10: 2 via RESPIRATORY_TRACT
  Filled 2021-04-10: qty 6.7

## 2021-04-10 MED ORDER — 0.9 % SODIUM CHLORIDE (POUR BTL) OPTIME
TOPICAL | Status: DC | PRN
  Administered 2021-04-10: 1000 mL

## 2021-04-10 MED ORDER — PROPOFOL 10 MG/ML IV BOLUS
INTRAVENOUS | Status: AC
  Filled 2021-04-10: qty 20

## 2021-04-10 MED ORDER — FENTANYL CITRATE (PF) 100 MCG/2ML IJ SOLN
INTRAMUSCULAR | Status: AC
  Administered 2021-04-10: 25 ug via INTRAVENOUS
  Filled 2021-04-10: qty 2

## 2021-04-10 MED ORDER — MORPHINE SULFATE (PF) 2 MG/ML IV SOLN: 2.0000 mg | INTRAVENOUS | Status: DC | PRN

## 2021-04-10 MED ORDER — ALBUTEROL SULFATE HFA 108 (90 BASE) MCG/ACT IN AERS
INHALATION_SPRAY | RESPIRATORY_TRACT | Status: DC | PRN
  Administered 2021-04-10: 4 via RESPIRATORY_TRACT

## 2021-04-10 MED ORDER — ROCURONIUM BROMIDE 10 MG/ML (PF) SYRINGE
PREFILLED_SYRINGE | INTRAVENOUS | Status: DC | PRN
  Administered 2021-04-10: 30 mg via INTRAVENOUS
  Administered 2021-04-10: 100 mg via INTRAVENOUS

## 2021-04-10 MED ORDER — MIDAZOLAM HCL 2 MG/2ML IJ SOLN
INTRAMUSCULAR | Status: AC
  Filled 2021-04-10: qty 2

## 2021-04-10 MED ORDER — LACTATED RINGERS IV SOLN: INTRAVENOUS | Status: DC

## 2021-04-10 MED ORDER — PHENYLEPHRINE HCL-NACL 10-0.9 MG/250ML-% IV SOLN
INTRAVENOUS | Status: DC | PRN
  Administered 2021-04-10: 20 ug/min via INTRAVENOUS

## 2021-04-10 MED ORDER — HEMOSTATIC AGENTS (NO CHARGE) OPTIME
TOPICAL | Status: DC | PRN
  Administered 2021-04-10: 1 via TOPICAL

## 2021-04-10 MED ORDER — FENTANYL CITRATE (PF) 250 MCG/5ML IJ SOLN
INTRAMUSCULAR | Status: AC
  Filled 2021-04-10: qty 5

## 2021-04-10 MED ORDER — ACETAMINOPHEN 500 MG PO TABS
1000.0000 mg | ORAL_TABLET | Freq: Three times a day (TID) | ORAL | Status: DC
  Administered 2021-04-10 – 2021-04-13 (×7): 1000 mg via ORAL
  Filled 2021-04-10 (×8): qty 2

## 2021-04-10 MED ORDER — FENTANYL CITRATE (PF) 100 MCG/2ML IJ SOLN
25.0000 ug | INTRAMUSCULAR | Status: DC | PRN
  Administered 2021-04-10: 25 ug via INTRAVENOUS
  Administered 2021-04-10: 50 ug via INTRAVENOUS

## 2021-04-10 MED ORDER — ORAL CARE MOUTH RINSE: 15.0000 mL | Freq: Once | OROMUCOSAL | Status: AC

## 2021-04-10 MED ORDER — BUPIVACAINE HCL (PF) 0.25 % IJ SOLN
INTRAMUSCULAR | Status: AC
  Filled 2021-04-10: qty 30

## 2021-04-10 MED ORDER — CHLORHEXIDINE GLUCONATE 0.12 % MT SOLN: 15.0000 mL | Freq: Once | OROMUCOSAL | Status: AC

## 2021-04-10 MED ORDER — ONDANSETRON HCL 4 MG/2ML IJ SOLN
INTRAMUSCULAR | Status: DC | PRN
  Administered 2021-04-10: 4 mg via INTRAVENOUS

## 2021-04-10 MED ORDER — DEXAMETHASONE SODIUM PHOSPHATE 10 MG/ML IJ SOLN
INTRAMUSCULAR | Status: DC | PRN
  Administered 2021-04-10: 10 mg via INTRAVENOUS

## 2021-04-10 MED ORDER — POTASSIUM CHLORIDE 20 MEQ PO PACK
40.0000 meq | PACK | Freq: Two times a day (BID) | ORAL | Status: DC
  Filled 2021-04-10: qty 2

## 2021-04-10 MED ORDER — SODIUM CHLORIDE 0.9 % IR SOLN
Status: DC | PRN
  Administered 2021-04-10: 1000 mL

## 2021-04-10 MED ORDER — PROPOFOL 10 MG/ML IV BOLUS
INTRAVENOUS | Status: DC | PRN
  Administered 2021-04-10: 130 mg via INTRAVENOUS

## 2021-04-10 MED ORDER — POTASSIUM CHLORIDE CRYS ER 20 MEQ PO TBCR
40.0000 meq | EXTENDED_RELEASE_TABLET | Freq: Two times a day (BID) | ORAL | Status: AC
  Administered 2021-04-10 (×2): 40 meq via ORAL
  Filled 2021-04-10 (×2): qty 2

## 2021-04-10 MED ORDER — SUGAMMADEX SODIUM 200 MG/2ML IV SOLN
INTRAVENOUS | Status: DC | PRN
  Administered 2021-04-10: 300 mg via INTRAVENOUS

## 2021-04-10 MED ORDER — ACETAMINOPHEN 500 MG PO TABS
ORAL_TABLET | ORAL | Status: AC
  Filled 2021-04-10: qty 2

## 2021-04-10 MED ORDER — METHOCARBAMOL 500 MG PO TABS: 500.0000 mg | ORAL_TABLET | Freq: Three times a day (TID) | ORAL | Status: DC | PRN

## 2021-04-10 MED ORDER — FENTANYL CITRATE (PF) 250 MCG/5ML IJ SOLN
INTRAMUSCULAR | Status: DC | PRN
  Administered 2021-04-10: 100 ug via INTRAVENOUS
  Administered 2021-04-10: 25 ug via INTRAVENOUS

## 2021-04-10 MED ORDER — HYDROCHLOROTHIAZIDE 12.5 MG PO CAPS
12.5000 mg | ORAL_CAPSULE | Freq: Every day | ORAL | Status: DC
  Administered 2021-04-10 – 2021-04-13 (×4): 12.5 mg via ORAL
  Filled 2021-04-10 (×4): qty 1

## 2021-04-10 MED ORDER — LIDOCAINE 2% (20 MG/ML) 5 ML SYRINGE
INTRAMUSCULAR | Status: DC | PRN
  Administered 2021-04-10: 80 mg via INTRAVENOUS

## 2021-04-10 MED ORDER — ACETAMINOPHEN 500 MG PO TABS
1000.0000 mg | ORAL_TABLET | Freq: Once | ORAL | Status: AC
  Administered 2021-04-10: 1000 mg via ORAL

## 2021-04-10 MED ORDER — PHENYLEPHRINE 40 MCG/ML (10ML) SYRINGE FOR IV PUSH (FOR BLOOD PRESSURE SUPPORT)
PREFILLED_SYRINGE | INTRAVENOUS | Status: DC | PRN
  Administered 2021-04-10 (×2): 120 ug via INTRAVENOUS

## 2021-04-10 MED ORDER — BUPIVACAINE HCL (PF) 0.25 % IJ SOLN
INTRAMUSCULAR | Status: DC | PRN
  Administered 2021-04-10: 10 mL

## 2021-04-10 MED ORDER — LACTATED RINGERS IV BOLUS
1000.0000 mL | Freq: Once | INTRAVENOUS | Status: AC
  Administered 2021-04-10: 1000 mL via INTRAVENOUS

## 2021-04-10 MED ORDER — CHLORHEXIDINE GLUCONATE 0.12 % MT SOLN
OROMUCOSAL | Status: AC
  Administered 2021-04-10: 15 mL via OROMUCOSAL
  Filled 2021-04-10: qty 15

## 2021-04-10 SURGICAL SUPPLY — 53 items
APL PRP STRL LF DISP 70% ISPRP (MISCELLANEOUS) ×1
APL SKNCLS STERI-STRIP NONHPOA (GAUZE/BANDAGES/DRESSINGS) ×1
APPLIER CLIP ROT 10 11.4 M/L (STAPLE) ×2
APR CLP MED LRG 11.4X10 (STAPLE) ×1
BAG SPEC RTRVL 10 TROC 200 (ENDOMECHANICALS)
BAG SPEC RTRVL LRG 6X4 10 (ENDOMECHANICALS)
BENZOIN TINCTURE PRP APPL 2/3 (GAUZE/BANDAGES/DRESSINGS) ×2 IMPLANT
BLADE CLIPPER SURG (BLADE) IMPLANT
CANISTER SUCT 3000ML PPV (MISCELLANEOUS) ×2 IMPLANT
CHLORAPREP W/TINT 26 (MISCELLANEOUS) ×2 IMPLANT
CLIP APPLIE ROT 10 11.4 M/L (STAPLE) ×1 IMPLANT
COVER MAYO STAND STRL (DRAPES) ×2 IMPLANT
COVER SURGICAL LIGHT HANDLE (MISCELLANEOUS) ×2 IMPLANT
COVER WAND RF STERILE (DRAPES) ×1 IMPLANT
DRAIN CHANNEL 19F RND (DRAIN) ×1 IMPLANT
DRAPE C-ARM 42X120 X-RAY (DRAPES) ×2 IMPLANT
DRSG TEGADERM 2-3/8X2-3/4 SM (GAUZE/BANDAGES/DRESSINGS) ×6 IMPLANT
DRSG TEGADERM 4X4.75 (GAUZE/BANDAGES/DRESSINGS) ×2 IMPLANT
ELECT REM PT RETURN 9FT ADLT (ELECTROSURGICAL) ×2
ELECTRODE REM PT RTRN 9FT ADLT (ELECTROSURGICAL) ×1 IMPLANT
EVACUATOR SILICONE 100CC (DRAIN) ×1 IMPLANT
GAUZE SPONGE 2X2 8PLY STRL LF (GAUZE/BANDAGES/DRESSINGS) ×1 IMPLANT
GLOVE BIO SURGEON STRL SZ7 (GLOVE) ×2 IMPLANT
GLOVE BIOGEL PI IND STRL 7.5 (GLOVE) ×1 IMPLANT
GLOVE BIOGEL PI INDICATOR 7.5 (GLOVE) ×1
GOWN STRL REUS W/ TWL LRG LVL3 (GOWN DISPOSABLE) ×3 IMPLANT
GOWN STRL REUS W/TWL LRG LVL3 (GOWN DISPOSABLE) ×6
HEMOSTAT SNOW SURGICEL 2X4 (HEMOSTASIS) ×1 IMPLANT
KIT BASIN OR (CUSTOM PROCEDURE TRAY) ×2 IMPLANT
KIT TURNOVER KIT B (KITS) ×2 IMPLANT
NS IRRIG 1000ML POUR BTL (IV SOLUTION) ×2 IMPLANT
PAD ARMBOARD 7.5X6 YLW CONV (MISCELLANEOUS) ×2 IMPLANT
POUCH RETRIEVAL ECOSAC 10 (ENDOMECHANICALS) IMPLANT
POUCH RETRIEVAL ECOSAC 10MM (ENDOMECHANICALS)
POUCH SPECIMEN RETRIEVAL 10MM (ENDOMECHANICALS) IMPLANT
SCISSORS LAP 5X35 DISP (ENDOMECHANICALS) ×2 IMPLANT
SET CHOLANGIOGRAPH 5 50 .035 (SET/KITS/TRAYS/PACK) ×2 IMPLANT
SET IRRIG TUBING LAPAROSCOPIC (IRRIGATION / IRRIGATOR) ×2 IMPLANT
SET TUBE SMOKE EVAC HIGH FLOW (TUBING) ×2 IMPLANT
SLEEVE ENDOPATH XCEL 5M (ENDOMECHANICALS) ×2 IMPLANT
SPECIMEN JAR SMALL (MISCELLANEOUS) ×2 IMPLANT
SPONGE GAUZE 2X2 STER 10/PKG (GAUZE/BANDAGES/DRESSINGS) ×1
STRIP CLOSURE SKIN 1/2X4 (GAUZE/BANDAGES/DRESSINGS) ×2 IMPLANT
SUT ETHILON 2 0 FS 18 (SUTURE) ×1 IMPLANT
SUT MNCRL AB 4-0 PS2 18 (SUTURE) ×2 IMPLANT
TOWEL GREEN STERILE (TOWEL DISPOSABLE) ×2 IMPLANT
TOWEL GREEN STERILE FF (TOWEL DISPOSABLE) ×2 IMPLANT
TRAY LAPAROSCOPIC MC (CUSTOM PROCEDURE TRAY) ×2 IMPLANT
TROCAR 5M 150ML BLDLS (TROCAR) ×3 IMPLANT
TROCAR XCEL BLUNT TIP 100MML (ENDOMECHANICALS) ×2 IMPLANT
TROCAR XCEL NON-BLD 11X100MML (ENDOMECHANICALS) ×2 IMPLANT
TROCAR XCEL NON-BLD 5MMX100MML (ENDOMECHANICALS) ×2 IMPLANT
WATER STERILE IRR 1000ML POUR (IV SOLUTION) ×2 IMPLANT

## 2021-04-10 NOTE — Anesthesia Procedure Notes (Signed)
Procedure Name: Intubation Date/Time: 04/10/2021 9:33 AM Performed by: Thelma Comp, CRNA Pre-anesthesia Checklist: Patient identified, Emergency Drugs available, Suction available and Patient being monitored Patient Re-evaluated:Patient Re-evaluated prior to induction Oxygen Delivery Method: Circle System Utilized Preoxygenation: Pre-oxygenation with 100% oxygen Induction Type: IV induction Ventilation: Mask ventilation without difficulty Laryngoscope Size: Mac and 3 Grade View: Grade I Tube type: Oral Tube size: 7.0 mm Number of attempts: 1 Airway Equipment and Method: Stylet Placement Confirmation: ETT inserted through vocal cords under direct vision,  positive ETCO2 and breath sounds checked- equal and bilateral Secured at: 23 cm Tube secured with: Tape Dental Injury: Teeth and Oropharynx as per pre-operative assessment

## 2021-04-10 NOTE — Op Note (Signed)
Procedure(s): LAPAROSCOPIC CHOLECYSTECTOMY Procedure Note  Diane Sutton female 65 y.o. 04/10/2021  Procedure(s) and Anesthesia Type:    * LAPAROSCOPIC CHOLECYSTECTOMY - General  Surgeon(s) and Role:    Donnie Mesa, MD - Primary    * Caryn Bee, MD - Resident-Assisting   Indications: Acute cholecystitis     Surgeon: Donnie Mesa   Assistants: Caryn Bee   Anesthesia: General endotracheal anesthesia  ASA Class: 3   Procedure Detail LAPAROSCOPIC CHOLECYSTECTOMY  After informed consent was obtained, the patient was brought to the operating room and positioned on the operating room table in the supine position with both arms out.  After an adequate level of general anesthesia had been obtained, the abdomen was prepped and draped in the standard sterile fashion.  A surgical timeout was performed following the standard institutional protocol. Preoperative antibiotics were administered prior to incision.  We started with a 15 mm supraumbilical incision. Dissection was carried down to the fascia with blunt dissection. The fascia was incised under direct visualization.  The abdominal cavity was then entered. A 0 vicryl pursestring stitch was placed to hold a Hasson port in place. We then were unable to achieve good insufflation and realized that we were below the omentum due to adhesions to the anterior abdominal wall. We then elected to enter the abdomen in the epigastric area in Optiview technique under direct visualization.  Pneumoperitoneum was achieved to an insufflation pressure of 15 mmHg.  Diagnostic laparoscopy was performed that revealed adhesions between the omentum and anterior abdominal wall. We placed two additional 81mm ports along the right costal margin and lysed some of these adhesions sharply to bring our supraumbilical port above the omentum. A fifth 72mm port was placed between the epigastric and supraumbilical port in the midline.   We then retracted the  gallbladder superiorly and the infundibulum lateral. This was only achieved after needle decompression. The gallbladder had patchy necrosis and there was fibrinous exudate in the RUQ consistent with gangrenous cholecystitis.   The peritoneum along the medial and lateral aspect of the gallbladder was then opened with electrocautery.  The cystic duct was easily identified and circumferentially dissected free and a combination of blunt dissection and hook cautery dissection.  The cystic artery was then identified just medial to that and was also circumferentially dissected out creating a window behind the gallbladder.  The cystic duct was then clipped with two 5 mm clips proximal and 1 distal and transected between the clips.  The cystic artery was clipped with two 5 mm hemolock clips proximal and 1 distal and also transected between the clips.  We then dissected the gallbladder off the liver bed using hook cautery.  Of note, there was no good plane and in fact the posterior wall of the gallbladder was necrotic and the gallbladder intrahepatic which made this dissection extremely challenging. A hole was made into the gallbladder with spillage of a small amount of bile and several gallstones.  These were evacuated from the abdominal cavity using the stone scoop.   Once the gallbladder was taken off the liver it was placed in a laparoscopic specimen retrieval bag and removed through the 10 mm port site.  Hemostasis of the gallbladder bed was confirmed. A piece of Surgicel was placed in the gallbladder fossa. A 19Fr Blake drain was also placed in the gallbladder fossa and externalized through the right lateral port site.  The supraumbilical port site was closed with the previously placed pursestring suture. The remaining ports were removed  under direct visualization there was no bleeding from the port site.  0.25% Marcaine was injected in all incisions.  The skin was then closed with 4-0 Biosyn and steri strips  as well as a dry dressing was applied.  All needle and instrument counts were correct at the end of the case.  The patient was awakened from anesthesia without any complications and transferred to the PACU in stable condition.   Findings: Gangrenous cholecystitis, cholelithiasis, fibrinous exudate in the RUQ  Estimated Blood Loss:  70mL         Drains: JACKSON-PRATT (JP)         Total IV Fluids: see anesthesia record  Blood Given: none          Specimens: gallbladder and contents         Implants: none        Complications:  * No complications entered in OR log *         Disposition: PACU - hemodynamically stable.         Condition: stable

## 2021-04-10 NOTE — Progress Notes (Addendum)
   Subjective:   Ms. Diane Sutton was evaluated at bedside after lap chole this morning. She endorses abdominal pain which she states is tolerable. Denies any other discomfort, including shortness of breathe.   Objective:  Vital signs in last 24 hours: Vitals:   04/09/21 1859 04/09/21 2006 04/10/21 0005 04/10/21 0400  BP: 138/80 125/77 127/79 124/81  Pulse: (!) 108 (!) 107 (!) 103 (!) 107  Resp: (!) 21 20 20 20   Temp: (!) 100.4 F (38 C) 99.7 F (37.6 C) 98.8 F (37.1 C) 99.9 F (37.7 C)  TempSrc: Oral Oral Oral Oral  SpO2: 98% 99% 100% 98%  Weight:      Height:        Physical Exam Constitutional: no acute distress, appears tired Head: atraumatic ENT: external ears normal Eyes: EOMI Cardiovascular: tachycardic, regular rhythm, normal heart sounds Pulmonary: effort normal, lungs clear to ascultation bilaterally Abdominal: distended more so than yesterday, diffuse tenderness, bowel sounds decreased Skin: warm and dry Neurological: somnolent Psychiatric: normal mood and affect  Assessment/Plan: Golda Zavalza is a 65 y.o. female with hx of of hypertension, schizophrenia, asthma presenting with abdominal pain currently admitted for acute cholecystitis. S/p laparoscopic cholecystectomy which demonstrated gangrenous cholecystitis complicated by leakage of gallbladder contents into abdominal cavity.  Active Problems:   Acute cholecystitis   Ms. Belvin is a 65 year old female with a past medical history of hypertension, schizophrenia, asthma presenting with abdominal pain currently admitted for acute cholecystitis.  # Acute Gangraneous Cholecystitis Postop day 0.  Fever and marked leukocytosis this admission which is improving.  Underwent laparoscopic cholecystectomy today which demonstrated gangrenous cholecystitis and was complicated by leakage of small amount of gallbladder contents at the abdominal cavity which was irrigated and suctioned.  As such, she has high risk of  peritonitis. -f/u surgery pathology -IV fluid bolus for tachycardia -zosyn, day 2 -MiraLAX -Fentanyl as needed -Clear liquid diet  Asthma Pulmonary edema Some shortness of breath with exertion, none at rest.  No wheezing on exam. CXR with some pulmonary edema but likely appears exaggerated due to poor inspiration. Saturating upper 90s on 2L. -Oxygen by nasal cannula -DuoNebs as needed  Hypertension Home med of HCTZ 12.5 mg.  Normotensive thus far. -Continue Home medications  Diet: Clear liquids IVF:  Normal saline VTE:  SCD Prior to Admission Living Arrangement:  home Anticipated Discharge Location:  home Barriers to Discharge:  surgery Dispo: Anticipated discharge in approximately 2-4 day(s).   Andrew Au, MD 04/10/2021, 7:06 AM Pager: [phone] After 5pm on weekdays and 1pm on weekends: On Call pager [phone]

## 2021-04-10 NOTE — Anesthesia Preprocedure Evaluation (Addendum)
Anesthesia Evaluation  Patient identified by MRN, date of birth, ID band Patient awake    Reviewed: Allergy & Precautions, NPO status , Patient's Chart, lab work & pertinent test results  Airway Mallampati: III  TM Distance: >3 FB Neck ROM: Full    Dental  (+) Edentulous Upper, Dental Advisory Given   Pulmonary asthma , sleep apnea , COPD (currently on 3L O2 but no O2 at home),  COPD inhaler, former smoker,    Pulmonary exam normal  + wheezing      Cardiovascular hypertension, Pt. on medications Normal cardiovascular exam Rhythm:Regular Rate:Normal     Neuro/Psych  Headaches, PSYCHIATRIC DISORDERS Depression Schizophrenia    GI/Hepatic negative GI ROS, Neg liver ROS,   Endo/Other  Morbid obesity (BMI 46)  Renal/GU negative Renal ROS  negative genitourinary   Musculoskeletal negative musculoskeletal ROS (+)   Abdominal   Peds  Hematology  (+) HIV,   Anesthesia Other Findings   Reproductive/Obstetrics                            Anesthesia Physical Anesthesia Plan  ASA: III  Anesthesia Plan: General   Post-op Pain Management:    Induction: Intravenous  PONV Risk Score and Plan: 3 and Midazolam, Dexamethasone and Ondansetron  Airway Management Planned: Oral ETT  Additional Equipment:   Intra-op Plan:   Post-operative Plan: Extubation in OR  Informed Consent: I have reviewed the patients History and Physical, chart, labs and discussed the procedure including the risks, benefits and alternatives for the proposed anesthesia with the patient or authorized representative who has indicated his/her understanding and acceptance.     Dental advisory given  Plan Discussed with: CRNA  Anesthesia Plan Comments:         Anesthesia Quick Evaluation

## 2021-04-10 NOTE — Progress Notes (Signed)
Day of Surgery   Subjective/Chief Complaint: Still with some RUQ tenderness T. Bili increasing   Objective: Vital signs in last 24 hours: Temp:  [98.3 F (36.8 C)-102.4 F (39.1 C)] 100 F (37.8 C) (04/11 0812) Pulse Rate:  [97-114] 108 (04/11 0812) Resp:  [20-35] 20 (04/11 0812) BP: (115-139)/(72-96) 127/82 (04/11 0812) SpO2:  [95 %-100 %] 100 % (04/11 0812) Weight:  [129.2 kg] 129.2 kg (04/10 0947) Last BM Date: 04/07/21  Intake/Output from previous day: 04/10 0701 - 04/11 0700 In: 1071.1 [IV Piggyback:1071.1] Out: 100 [Urine:100] Intake/Output this shift: No intake/output data recorded.  GI: abd soft, ruq tenderness  Lab Results:  Recent Labs    04/09/21 0954 04/10/21 0350  WBC 23.5* 18.8*  HGB 13.3 12.6  HCT 41.9 39.6  PLT 249 207   BMET Recent Labs    04/09/21 0954 04/10/21 0350  NA 135 135  K 3.6 3.3*  CL 98 100  CO2 28 27  GLUCOSE 137* 138*  BUN 13 15  CREATININE 1.07* 0.98  CALCIUM 8.7* 8.5*   Hepatic Function Latest Ref Rng & Units 04/10/2021 04/09/2021 04/07/2021  Total Protein 6.5 - 8.1 g/dL 7.3 7.8 8.0  Albumin 3.5 - 5.0 g/dL 3.0(L) 3.4(L) 4.0  AST 15 - 41 U/L 23 32 23  ALT 0 - 44 U/L '28 31 23  ' Alk Phosphatase 38 - 126 U/L 87 89 71  Total Bilirubin 0.3 - 1.2 mg/dL 2.3(H) 1.7(H) 0.7  Bilirubin, Direct 0.0 - 0.3 mg/dL - - -    PT/INR No results for input(s): LABPROT, INR in the last 72 hours. ABG No results for input(s): PHART, HCO3 in the last 72 hours.  Invalid input(s): PCO2, PO2  Studies/Results: DG Chest Port 1 View  Result Date: 04/09/2021 CLINICAL DATA:  Preoperative respiratory evaluation. EXAM: PORTABLE CHEST 1 VIEW COMPARISON:  04/07/2021 FINDINGS: 1101 hours. Low lung volumes. The cardio pericardial silhouette is enlarged. There is pulmonary vascular congestion without overt pulmonary edema. Similar patchy opacity at the bases compatible with chronic atelectasis or scarring. The visualized bony structures of the thorax show no  acute abnormality. Telemetry leads overlie the chest. IMPRESSION: Low volume film with vascular congestion. Similar patchy bibasilar opacities compatible with atelectasis or scarring. Electronically Signed   By: Misty Stanley M.D.   On: 04/09/2021 11:31   US Abdomen Limited RUQ (LIVER/GB)  Result Date: 04/09/2021 CLINICAL DATA:  Right upper quadrant pain EXAM: ULTRASOUND ABDOMEN LIMITED RIGHT UPPER QUADRANT COMPARISON:  April 08, 2021 CT abdomen and pelvis as well as ultrasound right upper quadrant. FINDINGS: Gallbladder: Within the gallbladder, there are echogenic foci which move and shadow consistent with cholelithiasis. No appreciable gallbladder wall thickening or pericholecystic fluid. No sonographic Murphy sign noted by sonographer. Common bile duct: Diameter: 5 mm. No intrahepatic or extrahepatic biliary duct dilatation. Liver: Liver difficult to image in its entirety due to body habitus. No focal liver lesions in regions of liver which are interrogated. Increase in liver echogenicity noted. Portal vein is patent on color Doppler imaging with normal direction of blood flow towards the liver. Other: None. IMPRESSION: 1. Cholelithiasis. No gallbladder wall thickening or pericholecystic fluid. 2. Limited visualization of liver due to body habitus. No focal liver lesions evident in portions of liver which can be interrogated. Question a degree of underlying hepatic steatosis. Electronically Signed   By: Lowella Grip III M.D.   On: 04/09/2021 13:25    Anti-infectives: Anti-infectives (From admission, onward)   Start     Dose/Rate Route  Frequency Ordered Stop   04/09/21 1300  [MAR Hold]  piperacillin-tazobactam (ZOSYN) IVPB 3.375 g        (MAR Hold since Mon 04/10/2021 at 0836.Hold Reason: Transfer to a Procedural area.)   3.375 g 12.5 mL/hr over 240 Minutes Intravenous Every 8 hours 04/09/21 1240     04/09/21 1115  cefTRIAXone (ROCEPHIN) 2 g in sodium chloride 0.9 % 100 mL IVPB        2 g 200  mL/hr over 30 Minutes Intravenous  Once 04/09/21 1113 04/09/21 1153      Assessment/Plan: Cholecystitis Elevated LFT's  Plan laparoscopic cholecystectomy with intraoperative cholangiogram today.  The surgical procedure has been discussed with the patient.  Potential risks, benefits, alternative treatments, and expected outcomes have been explained.  All of the patient's questions at this time have been answered.  The likelihood of reaching the patient's treatment goal is good.  The patient understand the proposed surgical procedure and wishes to proceed.   LOS: 0 days    Diane Sutton 04/10/2021

## 2021-04-10 NOTE — Transfer of Care (Signed)
Immediate Anesthesia Transfer of Care Note  Patient: Diane Sutton  Procedure(s) Performed: LAPAROSCOPIC CHOLECYSTECTOMY (N/A Abdomen)  Patient Location: PACU  Anesthesia Type:General  Level of Consciousness: awake, alert  and patient cooperative  Airway & Oxygen Therapy: Patient Spontanous Breathing and Patient connected to face mask oxygen  Post-op Assessment: Report given to RN and Post -op Vital signs reviewed and stable  Post vital signs: Reviewed and stable  Last Vitals:  Vitals Value Taken Time  BP 116/74 04/10/21 1200  Temp    Pulse 104 04/10/21 1201  Resp 24 04/10/21 1201  SpO2 93 % 04/10/21 1201  Vitals shown include unvalidated device data.  Last Pain:  Vitals:   04/10/21 0858  TempSrc:   PainSc: 5       Patients Stated Pain Goal: 2 (16/10/96 0454)  Complications: No complications documented.

## 2021-04-11 ENCOUNTER — Encounter (HOSPITAL_COMMUNITY): Payer: Self-pay | Admitting: Surgery

## 2021-04-11 DIAGNOSIS — R6520 Severe sepsis without septic shock: Secondary | ICD-10-CM | POA: Diagnosis not present

## 2021-04-11 DIAGNOSIS — I10 Essential (primary) hypertension: Secondary | ICD-10-CM | POA: Diagnosis not present

## 2021-04-11 DIAGNOSIS — A419 Sepsis, unspecified organism: Secondary | ICD-10-CM | POA: Diagnosis not present

## 2021-04-11 DIAGNOSIS — K810 Acute cholecystitis: Secondary | ICD-10-CM | POA: Diagnosis not present

## 2021-04-11 LAB — COMPREHENSIVE METABOLIC PANEL
ALT: 67 U/L — ABNORMAL HIGH (ref 0–44)
AST: 77 U/L — ABNORMAL HIGH (ref 15–41)
Albumin: 2.6 g/dL — ABNORMAL LOW (ref 3.5–5.0)
Alkaline Phosphatase: 103 U/L (ref 38–126)
Anion gap: 7 (ref 5–15)
BUN: 14 mg/dL (ref 8–23)
CO2: 30 mmol/L (ref 22–32)
Calcium: 8.4 mg/dL — ABNORMAL LOW (ref 8.9–10.3)
Chloride: 100 mmol/L (ref 98–111)
Creatinine, Ser: 1.03 mg/dL — ABNORMAL HIGH (ref 0.44–1.00)
GFR, Estimated: 60 mL/min — ABNORMAL LOW (ref >60)
Glucose, Bld: 170 mg/dL — ABNORMAL HIGH (ref 70–99)
Potassium: 3.7 mmol/L (ref 3.5–5.1)
Sodium: 137 mmol/L (ref 135–145)
Total Bilirubin: 0.9 mg/dL (ref 0.3–1.2)
Total Protein: 7 g/dL (ref 6.5–8.1)

## 2021-04-11 LAB — SURGICAL PATHOLOGY

## 2021-04-11 LAB — CBC
HCT: 37.3 % (ref 36.0–46.0)
Hemoglobin: 12 g/dL (ref 12.0–15.0)
MCH: 31.7 pg (ref 26.0–34.0)
MCHC: 32.2 g/dL (ref 30.0–36.0)
MCV: 98.7 fL (ref 80.0–100.0)
Platelets: 236 10*3/uL (ref 150–400)
RBC: 3.78 MIL/uL — ABNORMAL LOW (ref 3.87–5.11)
RDW: 13 % (ref 11.5–15.5)
WBC: 17 10*3/uL — ABNORMAL HIGH (ref 4.0–10.5)
nRBC: 0 % (ref 0.0–0.2)

## 2021-04-11 MED ORDER — SENNA 8.6 MG PO TABS
2.0000 | ORAL_TABLET | Freq: Two times a day (BID) | ORAL | Status: DC
  Administered 2021-04-11 – 2021-04-12 (×3): 17.2 mg via ORAL
  Filled 2021-04-11 (×4): qty 2

## 2021-04-11 MED ORDER — DOCUSATE SODIUM 100 MG PO CAPS
100.0000 mg | ORAL_CAPSULE | Freq: Two times a day (BID) | ORAL | Status: DC
  Administered 2021-04-11 – 2021-04-12 (×4): 100 mg via ORAL
  Filled 2021-04-11 (×4): qty 1

## 2021-04-11 MED ORDER — POLYETHYLENE GLYCOL 3350 17 G PO PACK
17.0000 g | PACK | Freq: Two times a day (BID) | ORAL | Status: DC
  Administered 2021-04-11 – 2021-04-12 (×3): 17 g via ORAL
  Filled 2021-04-11 (×4): qty 1

## 2021-04-11 MED ORDER — SENNA 8.6 MG PO TABS: 2.0000 | ORAL_TABLET | Freq: Every day | ORAL | Status: DC

## 2021-04-11 NOTE — Anesthesia Postprocedure Evaluation (Signed)
Anesthesia Post Note  Patient: Chiropodist  Procedure(s) Performed: LAPAROSCOPIC CHOLECYSTECTOMY (N/A Abdomen)     Patient location during evaluation: PACU Anesthesia Type: General Level of consciousness: awake and alert Pain management: pain level controlled Vital Signs Assessment: post-procedure vital signs reviewed and stable Respiratory status: spontaneous breathing, nonlabored ventilation and respiratory function stable Cardiovascular status: blood pressure returned to baseline and stable Postop Assessment: no apparent nausea or vomiting Anesthetic complications: no   No complications documented.  Last Vitals:  Vitals:   04/10/21 2347 04/11/21 0406  BP: 127/82 129/88  Pulse: 96 80  Resp: 20 19  Temp: 36.7 C 36.5 C  SpO2: 97% 99%    Last Pain:  Vitals:   04/11/21 0853  TempSrc:   PainSc: 2                  Lidia Collum

## 2021-04-11 NOTE — Progress Notes (Addendum)
   Subjective:   Diane Sutton states she is feeling better today with significantly less pain. Has not required any prn pain meds since noon yesterday. She feels her abdominal distention improved. Having gas but no stool yet. Tolerating diet well.  Objective:  Vital signs in last 24 hours: Vitals:   04/10/21 1659 04/10/21 2004 04/10/21 2347 04/11/21 0406  BP: 117/88 130/66 127/82 129/88  Pulse: 93 89 96 80  Resp: 18 18 20 19   Temp: 98.8 F (37.1 C) 98.4 F (36.9 C) 98 F (36.7 C) 97.7 F (36.5 C)  TempSrc: Oral Oral Oral Oral  SpO2: 97% 92% 97% 99%  Weight:      Height:        Physical Exam Constitutional: no acute distress, appears tired Head: atraumatic ENT: external ears normal Eyes: EOMI Cardiovascular: tachycardic, regular rhythm, normal heart sounds Pulmonary: effort normal, lungs clear to ascultation bilaterally Abdominal: distended more so than yesterday, diffuse tenderness, bowel sounds decreased Skin: warm and dry Neurological: somnolent Psychiatric: normal mood and affect  Assessment/Plan: Diane Sutton is a 65 y.o. female with hx of of hypertension, schizophrenia, asthma presenting with abdominal pain currently admitted for acute cholecystitis. S/p laparoscopic cholecystectomy which demonstrated gangrenous cholecystitis complicated by leakage of gallbladder contents into abdominal cavity.  Principal Problem:   Gangrenous cholecystitis Active Problems:   Sepsis with acute organ dysfunction without septic shock (HCC)   Acute gangrenous cholecystitis   Diane Sutton is a 65 year old female with a past medical history of hypertension, schizophrenia, asthma presenting with abdominal pain currently admitted for acute cholecystitis, s/p lap cholecystectomy.   # Acute Gangraneous Cholecystitis Postop day 1.  Fevers resolved, leukocytosis improving. Had lap cholecystectomy which found  gangrenous cholecystitis and had leakage of small amount of gallbladder contents  into the abdominal cavity. No signs of cholecystitis. Drain in place with 50cc drainage. We considered whether the patient will need antibiotics after discharge given leakage of bowel contents including gallstone. A brief literature review suggests that this will not decrease his risk of infection. DiabeticMale.de -f/u surgical pathology -zosyn, day 3 -MiraLAX, add senna -Fentanyl as needed -advance to heart healthy diet, tolerating well  Asthma Pulmonary edema Some shortness of breath with exertion, none at rest.  No wheezing on exam. CXR with some pulmonary edema but likely appears exaggerated due to poor inspiration. Saturating upper 90s on 2L. -Oxygen by nasal cannula -DuoNebs as needed   Transaminitis Mild increase in AST and ALT today after surgery yesterday, during the gallbladder was fairly adhered to the liver, like due to this. Bilirubin improving. -daily CMP  Hypertension Home med of HCTZ 12.5 mg.  Normotensive thus far. -Continue Home medications  Diet: Heart Healthy IVF:  Normal saline VTE:  SCD Prior to Admission Living Arrangement:  home Anticipated Discharge Location:  home Barriers to Discharge:  surgery Dispo: Anticipated discharge in approximately 1 day(s).   Andrew Au, MD 04/11/2021, 6:31 AM Pager: [phone] After 5pm on weekdays and 1pm on weekends: On Call pager [phone]
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
## 2021-04-11 NOTE — Discharge Instructions (Signed)
Surgical Drain Home Care Surgical drains are used to remove extra fluid that normally builds up in a surgical wound after surgery. A surgical drain helps to heal a surgical wound. Different kinds of surgical drains include:  Active drains. These drains use suction to pull drainage away from the surgical wound. Drainage flows through a tube to a container outside of the body. With these drains, you need to keep the bulb or the drainage container flat (compressed) at all times, except while you empty it. Flattening the bulb or container creates suction.  Passive drains. These drains allow fluid to drain naturally, by gravity. Drainage flows through a tube to a bandage (dressing) or a container outside of the body. Passive drains do not need to be emptied. A drain is placed during surgery. Right after surgery, drainage is usually bright red and a little thicker than water. The drainage may gradually turn yellow or pink and become thinner. It is likely that your health care provider will remove the drain when the drainage stops or when the amount decreases to 1-2 Tbsp (15-30 mL) during a 24-hour period. Supplies needed:  Tape.  Germ-free cleaning solution (sterile saline).  Cotton swabs.  Split gauze drain sponge: 4 x 4 inches (10 x 10 cm).  Gauze square: 4 x 4 inches (10 x 10 cm). How to care for your surgical drain Care for your drain as told by your health care provider. This is important to help prevent infection. If your drain is placed at your back, or any other hard-to-reach area, ask another person to assist you in performing the following tasks: General care  Keep the skin around the drain dry and covered with a dressing at all times.  Check your drain area every day for signs of infection. Check for: ? Redness, swelling, or pain. ? Pus or a bad smell. ? Cloudy drainage. ? Tenderness or pressure at the drain exit site. Changing the dressing Follow instructions from your health care  provider about how to change your dressing. Change your dressing at least once a day. Change it more often if needed to keep the dressing dry. Make sure you: 1. Gather your supplies. 2. Wash your hands with soap and water before you change your dressing. If soap and water are not available, use hand sanitizer. 3. Remove the old dressing. Avoid using scissors to do that. 4. Wash your hands with soap and water again after removing the old dressing. 5. Use sterile saline to clean your skin around the drain. You may need to use a cotton swab to clean the skin. 6. Place the tube through the slit in a drain sponge. Place the drain sponge so that it covers your wound. 7. Place the gauze square or another drain sponge on top of the drain sponge that is on the wound. Make sure the tube is between those layers. 8. Tape the dressing to your skin. 9. Tape the drainage tube to your skin 1-2 inches (2.5-5 cm) below the place where the tube enters your body. Taping keeps the tube from pulling on any stitches (sutures) that you have. 10. Wash your hands with soap and water. 11. Write down the color of your drainage and how often you change your dressing. How to empty your active drain 1. Make sure that you have a measuring cup that you can empty your drainage into. 2. Wash your hands with soap and water. If soap and water are not available, use hand sanitizer. 3. Loosen   any pins or clips that hold the tube in place. 4. If your health care provider tells you to strip the tube to prevent clots and tube blockages: ? Hold the tube at the skin with one hand. Use your other hand to pinch the tubing with your thumb and first finger. ? Gently move your fingers down the tube while squeezing very lightly. This clears any drainage, clots, or tissue from the tube. ? You may need to do this several times each day to keep the tube clear. Do not pull on the tube. 5. Open the bulb cap or the drain plug. Do not touch the inside  of the cap or the bottom of the plug. 6. Turn the device upside down and gently squeeze. 7. Empty all of the drainage into the measuring cup. 8. Compress the bulb or the container and replace the cap or the plug. To compress the bulb or the container, squeeze it firmly in the middle while you close the cap or plug the container. 9. Write down the amount of drainage that you have in each 24-hour period. If you have less than 2 Tbsp (30 mL) of drainage during 24 hours, contact your health care provider. 10. Flush the drainage down the toilet. 11. Wash your hands with soap and water.   Contact a health care provider if:  You have redness, swelling, or pain around your drain area.  You have pus or a bad smell coming from your drain area.  You have a fever or chills.  The skin around your drain is warm to the touch.  The amount of drainage that you have is increasing instead of decreasing.  You have drainage that is cloudy.  There is a sudden stop or a sudden decrease in the amount of drainage that you have.  Your drain tube falls out.  Your active drain does not stay compressed after you empty it. Summary  Surgical drains are used to remove extra fluid that normally builds up in a surgical wound after surgery.  Different kinds of surgical drains include active drains and passive drains. Active drains use suction to pull drainage away from the surgical wound, and passive drains allow fluid to drain naturally.  It is important to care for your drain to prevent infection. If your drain is placed at your back, or any other hard-to-reach area, ask another person to assist you.  Contact your health care provider if you have redness, swelling, or pain around your drain area. This information is not intended to replace advice given to you by your health care provider. Make sure you discuss any questions you have with your health care provider. Document Revised: 01/21/2019 Document Reviewed:  01/21/2019 Elsevier Patient Education  2021 [employer]  Please arrive at least 30 min before your appointment to complete your check in paperwork.  If you are unable to arrive 30 min prior to your appointment time we may have to cancel or reschedule you. LAPAROSCOPIC SURGERY: POST OP INSTRUCTIONS Always review your discharge instruction sheet given to you by the facility where your surgery was performed. IF YOU HAVE DISABILITY OR FAMILY LEAVE FORMS, YOU MUST BRING THEM TO THE OFFICE FOR PROCESSING.   DO NOT GIVE THEM TO YOUR DOCTOR.  PAIN CONTROL  1. First take acetaminophen (Tylenol) AND/or ibuprofen (Advil) to control your pain after surgery.  Follow directions on package.  Taking acetaminophen (Tylenol) and/or ibuprofen (Advil) regularly after surgery will help  to control your pain and lower the amount of prescription pain medication you may need.  You should not take more than 4,000 mg (4 grams) of acetaminophen (Tylenol) in 24 hours.  You should not take ibuprofen (Advil), aleve, motrin, naprosyn or other NSAIDS if you have a history of stomach ulcers or chronic kidney disease.  2. A prescription for pain medication may be given to you upon discharge.  Take your pain medication as prescribed, if you still have uncontrolled pain after taking acetaminophen (Tylenol) or ibuprofen (Advil). 3. Use ice packs to help control pain. 4. If you need a refill on your pain medication, please contact your pharmacy.  They will contact our office to request authorization. Prescriptions will not be filled after 5pm or on week-ends.  HOME MEDICATIONS 5. Take your usually prescribed medications unless otherwise directed.  DIET 6. You should follow a light diet the first few days after arrival home.  Be sure to include lots of fluids daily. Avoid fatty, fried foods.   CONSTIPATION 7. It is common to experience some constipation after surgery and if you are taking  pain medication.  Increasing fluid intake and taking a stool softener (such as Colace) will usually help or prevent this problem from occurring.  A mild laxative (Milk of Magnesia or Miralax) should be taken according to package instructions if there are no bowel movements after 48 hours.  WOUND/INCISION CARE 8. Most patients will experience some swelling and bruising in the area of the incisions.  Ice packs will help.  Swelling and bruising can take several days to resolve.  9. Unless discharge instructions indicate otherwise, follow guidelines below  a. STERI-STRIPS - you may remove your outer bandages 48 hours after surgery, and you may shower at that time.  You have steri-strips (small skin tapes) in place directly over the incision.  These strips should be left on the skin for 7-10 days.   b. DERMABOND/SKIN GLUE - you may shower in 24 hours.  The glue will flake off over the next 2-3 weeks. 10. Any sutures or staples will be removed at the office during your follow-up visit.  ACTIVITIES 11. You may resume regular (light) daily activities beginning the next day--such as daily self-care, walking, climbing stairs--gradually increasing activities as tolerated.  You may have sexual intercourse when it is comfortable.  Refrain from any heavy lifting or straining until approved by your doctor. a. You may drive when you are no longer taking prescription pain medication, you can comfortably wear a seatbelt, and you can safely maneuver your car and apply brakes.  FOLLOW-UP 12. You should see your doctor in the office for a follow-up appointment approximately 2-3 weeks after your surgery.  You should have been given your post-op/follow-up appointment when your surgery was scheduled.  If you did not receive a post-op/follow-up appointment, make sure that you call for this appointment within a day or two after you arrive home to insure a convenient appointment time.  OTHER INSTRUCTIONS  WHEN TO CALL YOUR  DOCTOR: 1. Fever over 101.0 2. Inability to urinate 3. Continued bleeding from incision. 4. Increased pain, redness, or drainage from the incision. 5. Increasing abdominal pain  The clinic staff is available to answer your questions during regular business hours.  Please don't hesitate to call and ask to speak to one of the nurses for clinical concerns.  If you have a medical emergency, go to the nearest emergency room or call 911.  A Psychologist, sport and exercise from [facility]  Surgery is always on call at the hospital. [address] [place], [place], [place]  [address] ? [address], [place], [place]   [address] [phone] ? [phone] ? FAX [phone]

## 2021-04-11 NOTE — Progress Notes (Signed)
[facility] Progress Note  1 Day Post-Op  Subjective: CC-  Feeling better than prior to surgery. Abdomen sore but pain improved. Denies n/v. Passing a lot of flatus, no BM. Tolerating solid food. Has not gotten OOB since surgery. Tbili down 0.9  Lives at home alone Sister will be coming to stay with her after discharge  Objective: Vital signs in last 24 hours: Temp:  [97.2 F (36.2 C)-100 F (37.8 C)] 97.7 F (36.5 C) (04/12 0406) Pulse Rate:  [67-108] 80 (04/12 0406) Resp:  [14-30] 19 (04/12 0406) BP: (101-130)/(66-88) 129/88 (04/12 0406) SpO2:  [92 %-100 %] 99 % (04/12 0406) Last BM Date: 04/07/21  Intake/Output from previous day: 04/11 0701 - 04/12 0700 In: 853.8 [I.V.:800; IV Piggyback:53.8] Out: 790 [Urine:700; Drains:40; Blood:50] Intake/Output this shift: No intake/output data recorded.  PE: Gen:  Alert, NAD, pleasant Pulm: rate and effort normal on supplemental O2 via [place] Abd: obese, soft, +BS, appropriately tender, lap incisions with cdi dressings/ 1 dressing with small amount of dried blood present, drain with small amount of dark bloody fluid in bulb  Lab Results:  Recent Labs    04/10/21 0350 04/11/21 0138  WBC 18.8* 17.0*  HGB 12.6 12.0  HCT 39.6 37.3  PLT 207 236   BMET Recent Labs    04/10/21 0350 04/11/21 0138  NA 135 137  K 3.3* 3.7  CL 100 100  CO2 27 30  GLUCOSE 138* 170*  BUN 15 14  CREATININE 0.98 1.03*  CALCIUM 8.5* 8.4*   PT/INR No results for input(s): LABPROT, INR in the last 72 hours. CMP     Component Value Date/Time   NA 137 04/11/2021 0138   K 3.7 04/11/2021 0138   CL 100 04/11/2021 0138   CO2 30 04/11/2021 0138   GLUCOSE 170 (H) 04/11/2021 0138   BUN 14 04/11/2021 0138   CREATININE 1.03 (H) 04/11/2021 0138   CALCIUM 8.4 (L) 04/11/2021 0138   PROT 7.0 04/11/2021 0138   ALBUMIN 2.6 (L) 04/11/2021 0138   AST 77 (H) 04/11/2021 0138   ALT 67 (H) 04/11/2021 0138   ALKPHOS 103 04/11/2021 0138   BILITOT  0.9 04/11/2021 0138   GFRNONAA 60 (L) 04/11/2021 0138   GFRAA >60 12/04/2018 0745   Lipase     Component Value Date/Time   LIPASE 21 04/09/2021 0954       Studies/Results: DG Chest Port 1 View  Result Date: 04/09/2021 CLINICAL DATA:  Preoperative respiratory evaluation. EXAM: PORTABLE CHEST 1 VIEW COMPARISON:  04/07/2021 FINDINGS: 1101 hours. Low lung volumes. The cardio pericardial silhouette is enlarged. There is pulmonary vascular congestion without overt pulmonary edema. Similar patchy opacity at the bases compatible with chronic atelectasis or scarring. The visualized bony structures of the thorax show no acute abnormality. Telemetry leads overlie the chest. IMPRESSION: Low volume film with vascular congestion. Similar patchy bibasilar opacities compatible with atelectasis or scarring. Electronically Signed   By: Misty Stanley M.D.   On: 04/09/2021 11:31   US Abdomen Limited RUQ (LIVER/GB)  Result Date: 04/09/2021 CLINICAL DATA:  Right upper quadrant pain EXAM: ULTRASOUND ABDOMEN LIMITED RIGHT UPPER QUADRANT COMPARISON:  April 08, 2021 CT abdomen and pelvis as well as ultrasound right upper quadrant. FINDINGS: Gallbladder: Within the gallbladder, there are echogenic foci which move and shadow consistent with cholelithiasis. No appreciable gallbladder wall thickening or pericholecystic fluid. No sonographic Murphy sign noted by sonographer. Common bile duct: Diameter: 5 mm. No intrahepatic or extrahepatic biliary duct dilatation. Liver: Liver difficult  to image in its entirety due to body habitus. No focal liver lesions in regions of liver which are interrogated. Increase in liver echogenicity noted. Portal vein is patent on color Doppler imaging with normal direction of blood flow towards the liver. Other: None. IMPRESSION: 1. Cholelithiasis. No gallbladder wall thickening or pericholecystic fluid. 2. Limited visualization of liver due to body habitus. No focal liver lesions evident in  portions of liver which can be interrogated. Question a degree of underlying hepatic steatosis. Electronically Signed   By: Lowella Grip III M.D.   On: 04/09/2021 13:25    Anti-infectives: Anti-infectives (From admission, onward)   Start     Dose/Rate Route Frequency Ordered Stop   04/09/21 1300  piperacillin-tazobactam (ZOSYN) IVPB 3.375 g        3.375 g 12.5 mL/hr over 240 Minutes Intravenous Every 8 hours 04/09/21 1240     04/09/21 1115  cefTRIAXone (ROCEPHIN) 2 g in sodium chloride 0.9 % 100 mL IVPB        2 g 200 mL/hr over 30 Minutes Intravenous  Once 04/09/21 1113 04/09/21 1153       Assessment/Plan Asthma, pulmonary edema HTN Schizophrenia Obesity BMI 45.97  Acute gangrenous cholecystitis S/p laparoscopic cholecystectomy 4/11 Dr. Georgette Dover - POD#1 - unable to perform IOC - Tbili trending down - continue JP drain and monitor output - continue abx during admission - tolerating diet and passing flatus - Needs to mobilize. PT/OT consults pending. Patient may be ready for discharge as early as tomorrow. I will place discharge instructions and follow up info on AVS.  ID - zosyn 4/10>> FEN - HH diet VTE - SCDs, ok to start chemical DVT prophylaxis today from surgical standpoint Foley - none Follow up - drain check next week, D[facility] clinic   LOS: 1 day    Wellington [place], [facility] 04/11/2021, 8:01 AM Please see Amion for pager number during day hours 7:00am-4:30pm

## 2021-04-12 DIAGNOSIS — R6520 Severe sepsis without septic shock: Secondary | ICD-10-CM | POA: Diagnosis not present

## 2021-04-12 DIAGNOSIS — A419 Sepsis, unspecified organism: Secondary | ICD-10-CM | POA: Diagnosis not present

## 2021-04-12 DIAGNOSIS — I10 Essential (primary) hypertension: Secondary | ICD-10-CM | POA: Diagnosis not present

## 2021-04-12 DIAGNOSIS — K810 Acute cholecystitis: Secondary | ICD-10-CM | POA: Diagnosis not present

## 2021-04-12 LAB — COMPREHENSIVE METABOLIC PANEL
ALT: 55 U/L — ABNORMAL HIGH (ref 0–44)
AST: 41 U/L (ref 15–41)
Albumin: 2.5 g/dL — ABNORMAL LOW (ref 3.5–5.0)
Alkaline Phosphatase: 100 U/L (ref 38–126)
Anion gap: 4 — ABNORMAL LOW (ref 5–15)
BUN: 18 mg/dL (ref 8–23)
CO2: 34 mmol/L — ABNORMAL HIGH (ref 22–32)
Calcium: 8.3 mg/dL — ABNORMAL LOW (ref 8.9–10.3)
Chloride: 100 mmol/L (ref 98–111)
Creatinine, Ser: 0.98 mg/dL (ref 0.44–1.00)
GFR, Estimated: >60 mL/min (ref >60)
Glucose, Bld: 102 mg/dL — ABNORMAL HIGH (ref 70–99)
Potassium: 3.6 mmol/L (ref 3.5–5.1)
Sodium: 138 mmol/L (ref 135–145)
Total Bilirubin: 0.8 mg/dL (ref 0.3–1.2)
Total Protein: 7 g/dL (ref 6.5–8.1)

## 2021-04-12 LAB — CBC
HCT: 38.2 % (ref 36.0–46.0)
Hemoglobin: 12.1 g/dL (ref 12.0–15.0)
MCH: 31.3 pg (ref 26.0–34.0)
MCHC: 31.7 g/dL (ref 30.0–36.0)
MCV: 98.7 fL (ref 80.0–100.0)
Platelets: 275 10*3/uL (ref 150–400)
RBC: 3.87 MIL/uL (ref 3.87–5.11)
RDW: 13.1 % (ref 11.5–15.5)
WBC: 16.9 10*3/uL — ABNORMAL HIGH (ref 4.0–10.5)
nRBC: 0 % (ref 0.0–0.2)

## 2021-04-12 MED ORDER — AMOXICILLIN-POT CLAVULANATE 875-125 MG PO TABS
1.0000 | ORAL_TABLET | Freq: Two times a day (BID) | ORAL | Status: DC
  Administered 2021-04-12 – 2021-04-13 (×3): 1 via ORAL
  Filled 2021-04-12 (×4): qty 1

## 2021-04-12 MED ORDER — AMOXICILLIN-POT CLAVULANATE 875-125 MG PO TABS
1.0000 | ORAL_TABLET | Freq: Three times a day (TID) | ORAL | Status: DC
  Filled 2021-04-12: qty 1

## 2021-04-12 MED ORDER — SACCHAROMYCES BOULARDII 250 MG PO CAPS
250.0000 mg | ORAL_CAPSULE | Freq: Two times a day (BID) | ORAL | Status: DC
  Administered 2021-04-12 – 2021-04-13 (×2): 250 mg via ORAL
  Filled 2021-04-12 (×2): qty 1

## 2021-04-12 MED ORDER — ENOXAPARIN SODIUM 60 MG/0.6ML ~~LOC~~ SOLN
60.0000 mg | SUBCUTANEOUS | Status: DC
  Administered 2021-04-12: 60 mg via SUBCUTANEOUS
  Filled 2021-04-12: qty 0.6

## 2021-04-12 NOTE — Discharge Summary (Signed)
Name: Diane Sutton MRN: [id number] DOB: [birth year] 65 y.o. PCP: Mardi Mainland, FNP  Date of Admission: 04/09/2021  9:39 AM Date of Discharge:  04/13/21 Attending Physician: Velna Ochs, MD   Discharge Diagnosis: 1. Acute calculous gangrenous cholecystitis 2. Transaminitis  Discharge Medications: Allergies as of 04/13/2021   No Known Allergies     Medication List    TAKE these medications   amoxicillin-clavulanate 875-125 MG tablet Commonly known as: AUGMENTIN Take 1 tablet by mouth every 12 (twelve) hours for 5 days.   cetirizine 10 MG tablet Commonly known as: ZYRTEC Take 10 mg by mouth daily.   hydrochlorothiazide 12.5 MG capsule Commonly known as: MICROZIDE Take 12.5 mg by mouth daily.   ibuprofen 200 MG tablet Commonly known as: ADVIL Take 400 mg by mouth every 6 (six) hours as needed for mild pain.   ipratropium-albuterol 0.5-2.5 (3) MG/3ML Soln Commonly known as: DUONEB Take 3 mLs by nebulization every 6 (six) hours as needed. What changed: reasons to take this   meloxicam 7.5 MG tablet Commonly known as: MOBIC Take 7.5 mg by mouth daily as needed for pain.   ProAir HFA 108 (90 Base) MCG/ACT inhaler Generic drug: albuterol Take 1 puff by mouth every 6 (six) hours as needed for shortness of breath.   QUEtiapine 100 MG tablet Commonly known as: SEROQUEL Take 100 mg by mouth at bedtime.            Discharge Care Instructions  (From admission, onward)         Start     Ordered   04/13/21 0000  Discharge wound care:       Comments: Follow-up with surgery for drain removal   04/13/21 1148          Disposition and follow-up:   Ms.Diane Sutton was discharged from [facility] in Good condition.  At the hospital follow up visit please address:  1.  Follow up: . Cholecystitis: s/p lap cholecystectomy, this was technically difficult and had perforation of gallbladder with spillage, clinically much improved  after surgery, drained removed, discharge with 5 days of augmentin . Transaminitis: mild elevation in AST and ALT improving by day of discharge  2.  Labs / imaging needed at time of follow-up: CMP, CBC  3.  Pending labs/ test needing follow-up: surgical pathology of gallbladder  Follow-up Appointments:  Follow-up [facility], [place]. Go on 04/18/2021.   Specialty: General Surgery Why: Your appointment is 4/19 at 11:00am for drain check Please arrive 30 minutes prior to your appointment to check in and fill out paperwork. Bring photo ID and insurance information. Contact information: [address] [place], [place], PA-C. Go on 04/27/2021.   Specialty: General Surgery Why: Your appointment is 4/28 at 3:30pm Please arrive 15 minutes prior to your appointment to check in. Contact information: [place] [address] [phone]        Care, [facility] Follow up.   Specialty: Home Health Services Contact information: [address] [place] [address] [phone]               Hospital Course by problem list:  Acute calculous gangrenous cholecystitis Presented meeting sepsis criteria with fever, chills, markedly elevated WBC, elevated T bilirubin. Right upper quadrant ultrasound demonstrated cholelithiasis, no gallbladder wall thickening or pericholecystic fluid. Favored to be cholecystitis with  overall clinic picture. Improved with IV fluids. Lap chole on 4/11 which was technically difficult and had perforation of gallbladder. Patient rapidly improved afterwards, vitals normalized, WBC improving. Had mild transaminitis, likely due to technically difficult surgery, improved prior to discharge. Drain removed prior to discharge. Discharged with course of Augmentin. Need to follow up gallbladder pathology as it was gangrenous.   Pertinent  Labs, Studies, and Procedures:  DG Chest Port 1 View  Result Date: 04/09/2021 CLINICAL DATA:  Preoperative respiratory evaluation. EXAM: PORTABLE CHEST 1 VIEW COMPARISON:  04/07/2021 FINDINGS: 1101 hours. Low lung volumes. The cardio pericardial silhouette is enlarged. There is pulmonary vascular congestion without overt pulmonary edema. Similar patchy opacity at the bases compatible with chronic atelectasis or scarring. The visualized bony structures of the thorax show no acute abnormality. Telemetry leads overlie the chest. IMPRESSION: Low volume film with vascular congestion. Similar patchy bibasilar opacities compatible with atelectasis or scarring. Electronically Signed   By: Misty Stanley M.D.   On: 04/09/2021 11:31   US Abdomen Limited RUQ (LIVER/GB)  Result Date: 04/09/2021 CLINICAL DATA:  Right upper quadrant pain EXAM: ULTRASOUND ABDOMEN LIMITED RIGHT UPPER QUADRANT COMPARISON:  April 08, 2021 CT abdomen and pelvis as well as ultrasound right upper quadrant. FINDINGS: Gallbladder: Within the gallbladder, there are echogenic foci which move and shadow consistent with cholelithiasis. No appreciable gallbladder wall thickening or pericholecystic fluid. No sonographic Murphy sign noted by sonographer. Common bile duct: Diameter: 5 mm. No intrahepatic or extrahepatic biliary duct dilatation. Liver: Liver difficult to image in its entirety due to body habitus. No focal liver lesions in regions of liver which are interrogated. Increase in liver echogenicity noted. Portal vein is patent on color Doppler imaging with normal direction of blood flow towards the liver. Other: None. IMPRESSION: 1. Cholelithiasis. No gallbladder wall thickening or pericholecystic fluid. 2. Limited visualization of liver due to body habitus. No focal liver lesions evident in portions of liver which can be interrogated. Question a degree of underlying hepatic steatosis. Electronically Signed   By: Lowella Grip III M.D.   On:  04/09/2021 13:25    Recent Labs    04/11/21 0138 04/12/21 0119 04/13/21 0221  WBC 17.0* 16.9* 12.7*  CREATININE 1.03* 0.98 0.95  BILITOT 0.9 0.8 1.0  AST 77* 41 37  ALT 67* 55* 53*     Discharge Instructions: Discharge Instructions    Diet - low sodium heart healthy   Complete by: As directed    Discharge instructions   Complete by: As directed    Ms. Diane Sutton, it was a pleasure taking care of you.  You were admitted for infection of the gallbladder, which was surgically removed.  Looking much better, your lab work is improved.  1. START augmentin, twice daily for 5 more days 2. Follow up with surgery 3. Follow up with your PCP   Discharge wound care:   Complete by: As directed    Follow-up with surgery for drain removal   Increase activity slowly   Complete by: As directed       Signed: Andrew Au, MD 04/13/2021, 11:49 AM   Pager: [phone]

## 2021-04-12 NOTE — TOC Initial Note (Addendum)
Transition of Care [facility]) - Initial/Assessment Note    Patient Details  Name: Diane Sutton MRN: [id number] Date of Birth: October 14, 1956  Transition of Care [facility]) CM/SW Contact:    Marilu Favre, RN Phone Number: 04/12/2021, 11:11 AM  Clinical Narrative:                  Patient from home with sister. PT recommendation SNF vs improve to HHPT. Patient from home with sister, has a cane at home. Confirmed face sheet information with patient. Patient has a cane already.   Patient would prefer to go home with home health.   OT assigned to patient but not PT. NCM called PT/OT office spoke with Jarrett Soho she will see if PT can see today   Patient consulted for NCM to start SNF work up.    Submitted PASRR review , under review.   Faxed FL2 once have offers will provide to patient   Patient will need covid test within 24 hours of discharge to a SNF   PT , MD and patient in agreement for HHPT . HHPT arranged with Tommi Rumps with [facility]. PT recommending wheel chair and 3 in 1 , patient declined both.  Family will provide transport home at discharge.    Barriers to Discharge: Continued Medical Work up   Patient Goals and CMS Choice Patient states their goals for this hospitalization and ongoing recovery are:: to return to home CMS Medicare.gov Compare Post Acute Care list provided to:: Patient Choice offered to / list presented to : Patient  Expected Discharge Plan and Services         Living arrangements for the past 2 months: Apartment                                      Prior Living Arrangements/Services Living arrangements for the past 2 months: Apartment Lives with:: Siblings Patient language and need for interpreter reviewed:: Yes Do you feel safe going back to the place where you live?: Yes      Need for Family Participation in Patient Care: Yes (Comment) Care giver support system in place?: Yes (comment) Current home services: DME Criminal Activity/Legal  Involvement Pertinent to Current Situation/Hospitalization: No - Comment as needed  Activities of Daily Living      Permission Sought/Granted   Permission granted to share information with : No              Emotional Assessment Appearance:: Appears stated age Attitude/Demeanor/Rapport: Engaged Affect (typically observed): Accepting Orientation: : Oriented to Self,Oriented to Place,Oriented to  Time,Oriented to Situation Alcohol / Substance Use: Not Applicable Psych Involvement: No (comment)  Admission diagnosis:  Acute cholecystitis [K81.0] Cholecystitis [K81.9] RUQ pain [R10.11] Acute gangrenous cholecystitis [K81.0] Patient Active Problem List   Diagnosis Date Noted  . Acute gangrenous cholecystitis 04/10/2021  . Sepsis with acute organ dysfunction without septic shock ([place])   . Gangrenous cholecystitis 04/09/2021  . Gait disturbance 08/10/2019  . Tremor 08/10/2019  . Prolonged QT interval 12/02/2018  . Bronchitis 12/01/2018  . COPD with acute exacerbation ([place]) 11/13/2018  . Acute bronchitis 11/12/2018  . Acute respiratory failure ([place]) 10/24/2018  . HIV positive but "non-active"   . Paranoid schizophrenia ([place]) 02/05/2011  . SKIN RASH 10/04/2010  . ALLERGIC RHINITIS 05/02/2010  . CANDIDIASIS 04/05/2010  . THYROID NODULE, RIGHT 10/07/2009  . PNEUMONIA 09/23/2009  . CERVICAL RADICULOPATHY, LEFT 09/12/2009  . UTI 08/10/2009  .  CHEST PAIN UNSPECIFIED 08/10/2009  . 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 INGROWING NAIL 10/05/2008  . PRURITUS, EARS 06/08/2008  . OTALGIA 06/02/2008  . VAGINITIS, BACTERIAL 03/31/2008  . ARTHRITIS, RIGHT KNEE 03/23/2008  . OTHER ABNORMAL BLOOD CHEMISTRY 01/29/2008  . GOITER, MULTINODULAR 08/29/2007  . HYPERTHYROIDISM 08/29/2007  . DEPRESSION 08/29/2007  . MIGRAINE, CHRONIC 08/29/2007  . STRABISMUS 08/29/2007  . AUDITORY HALLUCINATION 08/29/2007   PCP:  Mardi Mainland, FNP Pharmacy:   [facility], [address] AT [address] [place] [address] Phone: [phone] Fax: [phone]     Social Determinants of Health (SDOH) Interventions    Readmission Risk Interventions No flowsheet data found.

## 2021-04-12 NOTE — Progress Notes (Addendum)
   Subjective:   Patient feels well this morning. Having mild abdominal pain, not requiring prn medications. Tolerating diet well, had a soft bowel movement this morning. States she is ambulating okay, prefers to go home with [facility] therapy rather than SNF.  Objective:  Vital signs in last 24 hours: Vitals:   04/11/21 1145 04/11/21 1723 04/12/21 0112 04/12/21 0459  BP: 119/82 140/79 119/87 131/71  Pulse: 82 94 97 98  Resp: 18 18 18 18   Temp: 97.7 F (36.5 C) 98.9 F (37.2 C) 98.3 F (36.8 C) 98 F (36.7 C)  TempSrc: Oral Oral Oral Oral  SpO2: 100% 96% 92% 90%  Weight:      Height:        Physical Exam Constitutional: no acute distress, appears tired Head: atraumatic ENT: external ears normal Eyes: EOMI Cardiovascular: tachycardic, regular rhythm, normal heart sounds Pulmonary: effort normal, lungs clear to ascultation bilaterally Abdominal: distended more so than yesterday, diffuse tenderness, bowel sounds decreased Skin: warm and dry Neurological: somnolent Psychiatric: normal mood and affect  Assessment/Plan: Diane Sutton is a 65 y.o. female with hx of of hypertension, schizophrenia, asthma presenting with abdominal pain currently admitted for acute cholecystitis. S/p laparoscopic cholecystectomy which demonstrated gangrenous cholecystitis complicated by leakage of gallbladder contents into abdominal cavity.  Principal Problem:   Gangrenous cholecystitis Active Problems:   Sepsis with acute organ dysfunction without septic shock (HCC)   Acute gangrenous cholecystitis   Diane Sutton is a 65 year old female with a past medical history of hypertension, schizophrenia, asthma presenting with abdominal pain currently admitted for acute cholecystitis, s/p lap cholecystectomy.   # Acute Gangraneous Cholecystitis Postop day 2.  Fevers resolved, leukocytosis improving. Had lap cholecystectomy which found  gangrenous cholecystitis and had leakage of small amount of gallbladder  contents into the abdominal cavity. No signs of peritonitis. Drain in place with 420cc drainage. -f/u surgical pathology -change zosyn to augmentin, day 1/5 -continue MiraLAX, senna -heart healthy diet, tolerating well  Asthma Pulmonary edema Some shortness of breath with exertion, none at rest.  No wheezing on exam. CXR with some pulmonary edema but likely appears exaggerated due to poor inspiration. Saturating upper 90s on 2L. -Oxygen by nasal cannula -DuoNebs as needed   Transaminitis Improving.  -daily CMP  Hypertension Home med of HCTZ 12.5 mg.  Normotensive thus far. -Continue Home medications  Diet: Heart Healthy IVF:  none VTE:  lovenox Prior to Admission Living Arrangement:  home Anticipated Discharge Location:  home Barriers to Discharge:  Recovery from surgery Dispo: Anticipated discharge in approximately 1 day(s).   Andrew Au, MD 04/12/2021, 7:20 AM Pager: [phone] After 5pm on weekdays and 1pm on weekends: On Call pager [phone]

## 2021-04-12 NOTE — NC FL2 (Signed)
[place] MEDICAID FL2 LEVEL OF CARE SCREENING TOOL     IDENTIFICATION  Patient Name: Diane Sutton Birthdate: December 06, 1956 Sex: female Admission Date (Current Location): 04/09/2021  [facility] and [place] Number:  Herbalist and Address:  [facility] [place], [place] [address] [place], [place] [address]      Provider Number: [id number]  Attending Physician Name and Address:  Velna Ochs, MD  Relative Name and Phone Number:       Current Level of Care: Hospital Recommended Level of Care: [place] Prior Approval Number:    Date Approved/Denied:   PASRR Number: pending  Discharge Plan: SNF    Current Diagnoses: Patient Active Problem List   Diagnosis Date Noted  . Acute gangrenous cholecystitis 04/10/2021  . Sepsis with acute organ dysfunction without septic shock ([place])   . Gangrenous cholecystitis 04/09/2021  . Gait disturbance 08/10/2019  . Tremor 08/10/2019  . Prolonged QT interval 12/02/2018  . Bronchitis 12/01/2018  . COPD with acute exacerbation ([place]) 11/13/2018  . Acute bronchitis 11/12/2018  . Acute respiratory failure ([place]) 10/24/2018  . HIV positive but "non-active"   . Paranoid schizophrenia ([place]) 02/05/2011  . SKIN RASH 10/04/2010  . ALLERGIC RHINITIS 05/02/2010  . CANDIDIASIS 04/05/2010  . THYROID NODULE, RIGHT 10/07/2009  . PNEUMONIA 09/23/2009  . CERVICAL RADICULOPATHY, LEFT 09/12/2009  . UTI 08/10/2009  . CHEST PAIN UNSPECIFIED 08/10/2009  . 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 INGROWING NAIL 10/05/2008  . PRURITUS, EARS 06/08/2008  . OTALGIA 06/02/2008  . VAGINITIS, BACTERIAL 03/31/2008  . ARTHRITIS, RIGHT KNEE 03/23/2008  . OTHER ABNORMAL BLOOD CHEMISTRY 01/29/2008  . GOITER, MULTINODULAR 08/29/2007  . HYPERTHYROIDISM 08/29/2007  . DEPRESSION 08/29/2007  . MIGRAINE, CHRONIC 08/29/2007  . STRABISMUS 08/29/2007  . AUDITORY HALLUCINATION 08/29/2007    Orientation RESPIRATION BLADDER Height &  Weight     Self,Time,Situation,Place  Normal Continent Weight: 129.2 kg Height:  5\' 6"  (167.6 cm)  BEHAVIORAL SYMPTOMS/MOOD NEUROLOGICAL BOWEL NUTRITION STATUS      Continent Diet  AMBULATORY STATUS COMMUNICATION OF NEEDS Skin   Limited Assist Verbally Surgical wounds (skin glue and dressing)                       Personal Care Assistance Level of Assistance  Bathing,Feeding,Dressing Bathing Assistance: Limited assistance Feeding assistance: Independent Dressing Assistance: Limited assistance     Functional Limitations Info             SPECIAL CARE FACTORS FREQUENCY  PT (By licensed PT),OT (By licensed OT)     PT Frequency: five times a week OT Frequency: five times a week            Contractures Contractures Info: Not present    Additional Factors Info  Code Status,Allergies Code Status Info: Full Allergies Info: No known allergies           Current Medications (04/12/2021):  This is the current hospital active medication list Current Facility-Administered Medications  Medication Dose Route Frequency Provider Last Rate Last Admin  . acetaminophen (TYLENOL) tablet 1,000 mg  1,000 mg Oral Q8H Jillyn Ledger, PA-C   1,000 mg at 04/12/21 4782  . amoxicillin-clavulanate (AUGMENTIN) 875-125 MG per tablet 1 tablet  1 tablet Oral Q12H Andrew Au, MD   1 tablet at 04/12/21 1129  . docusate sodium (COLACE) capsule 100 mg  100 mg Oral BID Meuth, Brooke A, PA-C   100 mg at 04/12/21 1128  . enoxaparin (LOVENOX) injection  60 mg  60 mg Subcutaneous Q24H Andrew Au, MD      . hydrochlorothiazide (MICROZIDE) capsule 12.5 mg  12.5 mg Oral Daily Jillyn Ledger, PA-C   12.5 mg at 04/12/21 1128  . ipratropium-albuterol (DUONEB) 0.5-2.5 (3) MG/3ML nebulizer solution 3 mL  3 mL Nebulization Q6H PRN Jillyn Ledger, PA-C   3 mL at 04/10/21 2217  . methocarbamol (ROBAXIN) tablet 500 mg  500 mg Oral Q8H PRN Maczis, Barth Kirks, PA-C      . oxyCODONE (Oxy  IR/ROXICODONE) immediate release tablet 5-10 mg  5-10 mg Oral Q4H PRN Maczis, Barth Kirks, PA-C      . polyethylene glycol (MIRALAX / GLYCOLAX) packet 17 g  17 g Oral BID Andrew Au, MD   17 g at 04/11/21 1307  . senna (SENOKOT) tablet 17.2 mg  2 tablet Oral BID Andrew Au, MD   17.2 mg at 04/12/21 1128     Discharge Medications: Please see discharge summary for a list of discharge medications.  Relevant Imaging Results:  Relevant Lab Results:   Additional Information room air, JP Drain x 1 ( may be removed prior to discharge), Pfizer covid vaccinations x 2 no booster  Jacalyn Lefevre, Edson Snowball, RN

## 2021-04-12 NOTE — Progress Notes (Signed)
2 Days Post-Op   Subjective/Chief Complaint: Sitting in chair - tolerating diet No complaints No BM yet, but passing flatus; no nausea PT eval - recommended SNF     Objective: Vital signs in last 24 hours: Temp:  [97.7 F (36.5 C)-98.9 F (37.2 C)] 98 F (36.7 C) (04/13 0459) Pulse Rate:  [82-98] 98 (04/13 0459) Resp:  [18] 18 (04/13 0459) BP: (119-140)/(71-87) 131/71 (04/13 0459) SpO2:  [90 %-100 %] 90 % (04/13 0459) Last BM Date: 04/07/21  Intake/Output from previous day: 04/12 0701 - 04/13 0700 In: -  Out: 1220 [Urine:800; Drains:420] Intake/Output this shift: No intake/output data recorded.  General appearance: alert, cooperative and no distress GI: soft, minimal tenderness; JP with thin serosanguinous non-bilious drainage Dressings removed - incisions c/d/i  Lab Results:  Recent Labs    04/11/21 0138 04/12/21 0119  WBC 17.0* 16.9*  HGB 12.0 12.1  HCT 37.3 38.2  PLT 236 275   BMET Recent Labs    04/11/21 0138 04/12/21 0119  NA 137 138  K 3.7 3.6  CL 100 100  CO2 30 34*  GLUCOSE 170* 102*  BUN 14 18  CREATININE 1.03* 0.98  CALCIUM 8.4* 8.3*   Hepatic Function Latest Ref Rng & Units 04/12/2021 04/11/2021 04/10/2021  Total Protein 6.5 - 8.1 g/dL 7.0 7.0 7.3  Albumin 3.5 - 5.0 g/dL 2.5(L) 2.6(L) 3.0(L)  AST 15 - 41 U/L 41 77(H) 23  ALT 0 - 44 U/L 55(H) 67(H) 28  Alk Phosphatase 38 - 126 U/L 100 103 87  Total Bilirubin 0.3 - 1.2 mg/dL 0.8 0.9 2.3(H)  Bilirubin, Direct 0.0 - 0.3 mg/dL - - -    PT/INR No results for input(s): LABPROT, INR in the last 72 hours. ABG No results for input(s): PHART, HCO3 in the last 72 hours.  Invalid input(s): PCO2, PO2  Studies/Results: No results found.  Anti-infectives: Anti-infectives (From admission, onward)   Start     Dose/Rate Route Frequency Ordered Stop   04/09/21 1300  piperacillin-tazobactam (ZOSYN) IVPB 3.375 g        3.375 g 12.5 mL/hr over 240 Minutes Intravenous Every 8 hours 04/09/21 1240      04/09/21 1115  cefTRIAXone (ROCEPHIN) 2 g in sodium chloride 0.9 % 100 mL IVPB        2 g 200 mL/hr over 30 Minutes Intravenous  Once 04/09/21 1113 04/09/21 1153      Assessment/Plan: Asthma, pulmonary edema HTN Schizophrenia Obesity BMI 45.97  Acute gangrenous cholecystitis S/p laparoscopic cholecystectomy 4/11 Dr. Georgette Dover - POD#2 - unable to perform IOC - Tbili normal - continue JP drain and monitor output; may consider removing drain prior to discharge - continue abx  - until WBC normalizes.  If discharged, would give an additional 5 days of Augmentin.  OK to convert to PO abx today. - tolerating diet and passing flatus - Needs to mobilize. PT/OT consults recommended SNF.  Discharge planning per primary team.  Discharge instructions and follow up info on AVS.  ID - zosyn 4/10>> FEN - HH diet VTE - SCDs, ok to start chemical DVT prophylaxis today from surgical standpoint Foley - none Follow up - DOW clinic   LOS: 2 days    Diane Sutton 04/12/2021
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
## 2021-04-12 NOTE — Social Work (Cosign Needed Addendum)
Diane Sutton [birth year]   Please be advised that the above-named patient will require a short-term nursing home stay - anticipated 30 days or less for rehabilitation and strengthening.  The plan is for return home.

## 2021-04-13 ENCOUNTER — Other Ambulatory Visit (HOSPITAL_COMMUNITY): Payer: Self-pay

## 2021-04-13 ENCOUNTER — Other Ambulatory Visit: Payer: Self-pay

## 2021-04-13 DIAGNOSIS — K810 Acute cholecystitis: Secondary | ICD-10-CM | POA: Diagnosis not present

## 2021-04-13 DIAGNOSIS — A419 Sepsis, unspecified organism: Secondary | ICD-10-CM | POA: Diagnosis not present

## 2021-04-13 LAB — COMPREHENSIVE METABOLIC PANEL
ALT: 53 U/L — ABNORMAL HIGH (ref 0–44)
AST: 37 U/L (ref 15–41)
Albumin: 2.5 g/dL — ABNORMAL LOW (ref 3.5–5.0)
Alkaline Phosphatase: 99 U/L (ref 38–126)
Anion gap: 7 (ref 5–15)
BUN: 19 mg/dL (ref 8–23)
CO2: 32 mmol/L (ref 22–32)
Calcium: 8.4 mg/dL — ABNORMAL LOW (ref 8.9–10.3)
Chloride: 95 mmol/L — ABNORMAL LOW (ref 98–111)
Creatinine, Ser: 0.95 mg/dL (ref 0.44–1.00)
GFR, Estimated: >60 mL/min (ref >60)
Glucose, Bld: 107 mg/dL — ABNORMAL HIGH (ref 70–99)
Potassium: 3.7 mmol/L (ref 3.5–5.1)
Sodium: 134 mmol/L — ABNORMAL LOW (ref 135–145)
Total Bilirubin: 1 mg/dL (ref 0.3–1.2)
Total Protein: 7.1 g/dL (ref 6.5–8.1)

## 2021-04-13 LAB — CBC
HCT: 38.2 % (ref 36.0–46.0)
Hemoglobin: 12.2 g/dL (ref 12.0–15.0)
MCH: 30.8 pg (ref 26.0–34.0)
MCHC: 31.9 g/dL (ref 30.0–36.0)
MCV: 96.5 fL (ref 80.0–100.0)
Platelets: 294 10*3/uL (ref 150–400)
RBC: 3.96 MIL/uL (ref 3.87–5.11)
RDW: 13.2 % (ref 11.5–15.5)
WBC: 12.7 10*3/uL — ABNORMAL HIGH (ref 4.0–10.5)
nRBC: 0 % (ref 0.0–0.2)

## 2021-04-13 MED ORDER — ENOXAPARIN SODIUM 80 MG/0.8ML ~~LOC~~ SOLN: 65.0000 mg | SUBCUTANEOUS | Status: DC

## 2021-04-13 MED ORDER — AMOXICILLIN-POT CLAVULANATE 875-125 MG PO TABS
1.0000 | ORAL_TABLET | Freq: Two times a day (BID) | ORAL | 0 refills | Status: AC
  Filled 2021-04-13: qty 10, 5d supply, fill #0

## 2021-04-13 NOTE — Progress Notes (Signed)
   Subjective:   Patient states that she feels well overall. She has some pain at the drain site when the drain is hanging, otherwirse no abdominal pain. Tolerating diet and having bowel function.  Objective:  Vital signs in last 24 hours: Vitals:   04/12/21 1233 04/12/21 1837 04/13/21 0031 04/13/21 0501  BP: (!) 116/96 123/66 120/86 135/83  Pulse: 96 97 96 89  Resp: 19 17 17 18   Temp: 98.4 F (36.9 C) 99.3 F (37.4 C) 98.9 F (37.2 C) 98.9 F (37.2 C)  TempSrc: Oral Oral Oral Oral  SpO2: 96% 100% 95% 94%  Weight:      Height:        Physical Exam Constitutional: no acute distress, appears tired Head: atraumatic ENT: external ears normal Eyes: EOMI Cardiovascular: tachycardic, regular rhythm, normal heart sounds Pulmonary: effort normal, lungs clear to ascultation bilaterally Abdominal: distension improved, mild diffuse tenderness, bowel sounds decreased Skin: warm and dry Neurological: alert, oriented Psychiatric: normal mood and affect  Assessment/Plan: Diane Sutton is a 65 y.o. female with hx of of hypertension, schizophrenia, asthma presenting with abdominal pain currently admitted for acute cholecystitis. S/p laparoscopic cholecystectomy which demonstrated gangrenous cholecystitis complicated by leakage of gallbladder contents into abdominal cavity.  Principal Problem:   Gangrenous cholecystitis Active Problems:   Sepsis with acute organ dysfunction without septic shock (HCC)   Acute gangrenous cholecystitis   Ms. Liaw is a 65 year old female with a past medical history of hypertension, schizophrenia, asthma presenting with abdominal pain currently admitted for acute cholecystitis, s/p lap cholecystectomy.   # Acute Gangraneous Cholecystitis Postop day 3.  Fevers resolved, leukocytosis improving. Had lap cholecystectomy which found  gangrenous cholecystitis and had leakage of small amount of gallbladder contents into the abdominal cavity. No signs of  peritonitis. Drain in place with 420cc drainage again today.  Has recovered well and is tolerating diet, stooling well, ambulating with assistance. -f/u surgical pathology -change zosyn to augmentin, 5 more days upon discharge -continue MiraLAX, senna -heart healthy diet, tolerating well  Asthma Pulmonary edema Some shortness of breath with exertion, none at rest.  No wheezing on exam. CXR with some pulmonary edema but likely appears exaggerated due to poor inspiration. Saturating upper 90s on 2L. -Oxygen by nasal cannula -DuoNebs as needed   Transaminitis Improving.  -daily CMP  Hypertension Home med of HCTZ 12.5 mg.  Normotensive thus far. -Continue Home medications  Diet: Heart Healthy IVF:  none VTE:  lovenox Prior to Admission Living Arrangement:  home Anticipated Discharge Location:  home Barriers to Discharge:  Recovery from surgery Dispo: Anticipated discharge today.   Andrew Au, MD 04/13/2021, 6:29 AM Pager: [phone] After 5pm on weekdays and 1pm on weekends: On Call pager [phone]

## 2021-04-13 NOTE — Progress Notes (Signed)
3 Days Post-Op  Subjective: CC: Doing well. Reports no abdominal pain. Tolerating diet without n/v. Mobilizing more with therapies - now recommended for [facility]. Voiding without difficulty. BM today.   Objective: Vital signs in last 24 hours: Temp:  [98.4 F (36.9 C)-99.3 F (37.4 C)] 98.9 F (37.2 C) (04/14 0501) Pulse Rate:  [89-97] 89 (04/14 0501) Resp:  [17-19] 18 (04/14 0501) BP: (116-135)/(66-96) 135/83 (04/14 0501) SpO2:  [94 %-100 %] 94 % (04/14 0501) Last BM Date: 04/13/21  Intake/Output from previous day: 04/13 0701 - 04/14 0700 In: 480 [P.O.:480] Out: 620 [Urine:200; Drains:420] Intake/Output this shift: No intake/output data recorded.  PE: Gen:  Alert, NAD, pleasant Pulm: rate and effort normal on supplemental O2 via [place] Abd: obese, soft, +BS, appropriately tender around the laparoscopic incisions and drain sites, lap incisions with c/d/i. Drain with small amount of dark bloody fluid in bulb  Lab Results:  Recent Labs    04/12/21 0119 04/13/21 0221  WBC 16.9* 12.7*  HGB 12.1 12.2  HCT 38.2 38.2  PLT 275 294   BMET Recent Labs    04/12/21 0119 04/13/21 0221  NA 138 134*  K 3.6 3.7  CL 100 95*  CO2 34* 32  GLUCOSE 102* 107*  BUN 18 19  CREATININE 0.98 0.95  CALCIUM 8.3* 8.4*   PT/INR No results for input(s): LABPROT, INR in the last 72 hours. CMP     Component Value Date/Time   NA 134 (L) 04/13/2021 0221   K 3.7 04/13/2021 0221   CL 95 (L) 04/13/2021 0221   CO2 32 04/13/2021 0221   GLUCOSE 107 (H) 04/13/2021 0221   BUN 19 04/13/2021 0221   CREATININE 0.95 04/13/2021 0221   CALCIUM 8.4 (L) 04/13/2021 0221   PROT 7.1 04/13/2021 0221   ALBUMIN 2.5 (L) 04/13/2021 0221   AST 37 04/13/2021 0221   ALT 53 (H) 04/13/2021 0221   ALKPHOS 99 04/13/2021 0221   BILITOT 1.0 04/13/2021 0221   GFRNONAA >60 04/13/2021 0221   GFRAA >60 12/04/2018 0745   Lipase     Component Value Date/Time   LIPASE 21 04/09/2021 0954        Studies/Results: No results found.  Anti-infectives: Anti-infectives (From admission, onward)   Start     Dose/Rate Route Frequency Ordered Stop   04/12/21 1030  amoxicillin-clavulanate (AUGMENTIN) 875-125 MG per tablet 1 tablet        1 tablet Oral Every 12 hours 04/12/21 0931     04/12/21 1000  amoxicillin-clavulanate (AUGMENTIN) 875-125 MG per tablet 1 tablet  Status:  Discontinued        1 tablet Oral 3 times daily 04/12/21 0808 04/12/21 0931   04/09/21 1300  piperacillin-tazobactam (ZOSYN) IVPB 3.375 g  Status:  Discontinued        3.375 g 12.5 mL/hr over 240 Minutes Intravenous Every 8 hours 04/09/21 1240 04/12/21 0808   04/09/21 1115  cefTRIAXone (ROCEPHIN) 2 g in sodium chloride 0.9 % 100 mL IVPB        2 g 200 mL/hr over 30 Minutes Intravenous  Once 04/09/21 1113 04/09/21 1153       Assessment/Plan Asthma, pulmonary edema HTN Schizophrenia Obesity BMI 45.97  Acute gangrenous cholecystitis S/p laparoscopic cholecystectomy 4/11 Dr. Georgette Dover -POD#3 - unable to perform IOC - Tbili normal - continue JP drain and monitor output; may consider removing drain prior to discharge - continue abx  - until WBC normalizes.  If discharged, would give an additional 5 days  of Augmentin.   - tolerating diet and passing flatus - Needs to mobilize. PT/OT consults recommended [place] now.  Discharge planning per primary team.  Discharge instructions and follow up info on AVS.  ID -zosyn 4/10 - 4/13. Augmentin 4/13 >> WBC 12.7 FEN -HH diet VTE -SCDs, Lovenox  Foley -none Follow up - [facility] clinic   LOS: 3 days    Jillyn Ledger , [facility] 04/13/2021, 10:24 AM Please see Amion for pager number during day hours 7:00am-4:30pm

## 2021-04-13 NOTE — Progress Notes (Signed)
Pt tele removed, ccmd aware. Pt's JP drain removed as requested by provider. Pt has all belongings and has no questions about d/c instructions. Pt d/c via wheelchair by NT. Pt was unable to get in touch with her ride and asked that we take her to the Emergency Dept entrance so she can catch the bus home.

## 2021-04-13 NOTE — Care Management Important Message (Signed)
Important Message  Patient Details  Name: Diane Sutton MRN: [id number] Date of Birth: 04-14-1956   Medicare Important Message Given:  Yes     [name] 04/13/2021, 4:29 PM

## 2021-04-17 ENCOUNTER — Emergency Department (HOSPITAL_COMMUNITY)
Admission: EM | Admit: 2021-04-17 | Discharge: 2021-04-17 | Disposition: A | Discharge status: Home / Self Care | Payer: Medicare Other | Attending: Emergency Medicine | Admitting: Emergency Medicine

## 2021-04-17 ENCOUNTER — Encounter (HOSPITAL_COMMUNITY): Payer: Self-pay | Admitting: Emergency Medicine

## 2021-04-17 ENCOUNTER — Other Ambulatory Visit: Payer: Self-pay

## 2021-04-17 DIAGNOSIS — J441 Chronic obstructive pulmonary disease with (acute) exacerbation: Secondary | ICD-10-CM | POA: Diagnosis not present

## 2021-04-17 DIAGNOSIS — Z87891 Personal history of nicotine dependence: Secondary | ICD-10-CM | POA: Diagnosis not present

## 2021-04-17 DIAGNOSIS — Z79899 Other long term (current) drug therapy: Secondary | ICD-10-CM | POA: Insufficient documentation

## 2021-04-17 DIAGNOSIS — Z5189 Encounter for other specified aftercare: Secondary | ICD-10-CM

## 2021-04-17 DIAGNOSIS — Z85028 Personal history of other malignant neoplasm of stomach: Secondary | ICD-10-CM | POA: Diagnosis not present

## 2021-04-17 DIAGNOSIS — B20 Human immunodeficiency virus [HIV] disease: Secondary | ICD-10-CM | POA: Diagnosis not present

## 2021-04-17 DIAGNOSIS — Z4800 Encounter for change or removal of nonsurgical wound dressing: Secondary | ICD-10-CM | POA: Diagnosis present

## 2021-04-17 DIAGNOSIS — I10 Essential (primary) hypertension: Secondary | ICD-10-CM | POA: Insufficient documentation

## 2021-04-17 NOTE — ED Provider Notes (Signed)
[facility] EMERGENCY DEPARTMENT Provider Note   CSN: [id number] Arrival date & time: 04/17/21  7353     History Chief Complaint  Patient presents with  . Wound Check    Diane Sutton is a 65 y.o. female.  65 year old female with past medical history below who presents for bandage change.  Patient had laparoscopic cholecystectomy on 4/11 and presents today to have her bandages changed.  She has not changed them since the surgery.  She was supposed to be see her PCP today in the clinic but they canceled her appointment and so she came to the ED for her bandage change.  She denies any fevers, vomiting, or abdominal pain.  Has been eating and drinking normally since the surgery.  The history is provided by the patient.  Wound Check Pertinent negatives include no abdominal pain.       Past Medical History:  Diagnosis Date  . Cancer ([place])   . Depression   . gastric ca   . HIV (human immunodeficiency virus infection) ([place])    positive-non active  . Hypertension   . Migraine   . Schizophrenia [facility])     Patient Active Problem List   Diagnosis Date Noted  . Acute gangrenous cholecystitis 04/10/2021  . Sepsis with acute organ dysfunction without septic shock ([place])   . Gangrenous cholecystitis 04/09/2021  . Gait disturbance 08/10/2019  . Tremor 08/10/2019  . Prolonged QT interval 12/02/2018  . Bronchitis 12/01/2018  . COPD with acute exacerbation ([place]) 11/13/2018  . Acute bronchitis 11/12/2018  . Acute respiratory failure ([place]) 10/24/2018  . HIV positive but "non-active"   . 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 SKIN RASH 10/04/2010  . ALLERGIC RHINITIS 05/02/2010  . CANDIDIASIS 04/05/2010  . THYROID NODULE, RIGHT 10/07/2009  . PNEUMONIA 09/23/2009  . CERVICAL RADICULOPATHY, LEFT 09/12/2009  . UTI 08/10/2009  . CHEST PAIN UNSPECIFIED 08/10/2009  . 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 INGROWING NAIL 10/05/2008  . PRURITUS, EARS 06/08/2008  .  OTALGIA 06/02/2008  . VAGINITIS, BACTERIAL 03/31/2008  . ARTHRITIS, RIGHT KNEE 03/23/2008  . OTHER ABNORMAL BLOOD CHEMISTRY 01/29/2008  . GOITER, MULTINODULAR 08/29/2007  . HYPERTHYROIDISM 08/29/2007  . DEPRESSION 08/29/2007  . MIGRAINE, CHRONIC 08/29/2007  . STRABISMUS 08/29/2007  . AUDITORY HALLUCINATION 08/29/2007    Past Surgical History:  Procedure Laterality Date  . ABDOMINAL HYSTERECTOMY    . ANKLE SURGERY  2004   orif left  . CHOLECYSTECTOMY N/A 04/10/2021   Procedure: LAPAROSCOPIC CHOLECYSTECTOMY;  Surgeon: Donnie Mesa, MD;  Location: [facility];  Service: General;  Laterality: N/A;  . FINGER SURGERY  2004   i/d lt middle finger  . STRABISMUS SURGERY Left 12/11/2007  . STRABISMUS SURGERY Bilateral 01/22/2014   Procedure: REPAIR STRABISMUS BILATERAL EYES;  Surgeon: Derry Skill, MD;  Location: [facility];  Service: Ophthalmology;  Laterality: Bilateral;  . THYROID SURGERY     needle tx     OB History   No obstetric history on file.     Family History  Problem Relation Age of Onset  . Heart disease Mother   . Colon cancer Neg Hx     Social History   Tobacco Use  . Smoking status: Former Research scientist (life sciences)  . Smokeless tobacco: Former Network engineer  . Vaping Use: Never used  Substance Use Topics  . Alcohol use: No  . Drug use: No    Home Medications Prior to Admission medications   Medication Sig Start Date End Date Taking?  Authorizing Provider  amoxicillin-clavulanate (AUGMENTIN) 875-125 MG tablet Take 1 tablet by mouth every 12 (twelve) hours for 5 days. 04/13/21 04/18/21  Andrew Au, MD  cetirizine (ZYRTEC) 10 MG tablet Take 10 mg by mouth daily.    [provider]  hydrochlorothiazide (MICROZIDE) 12.5 MG capsule Take 12.5 mg by mouth daily.  04/04/18   [provider]  ibuprofen (ADVIL) 200 MG tablet Take 400 mg by mouth every 6 (six) hours as needed for mild pain.    [provider]  ipratropium-albuterol (DUONEB)  0.5-2.5 (3) MG/3ML SOLN Take 3 mLs by nebulization every 6 (six) hours as needed. Patient taking differently: Take 3 mLs by nebulization every 6 (six) hours as needed (wheezing). 10/26/18   Raiford Noble Latif, DO  meloxicam (MOBIC) 7.5 MG tablet Take 7.5 mg by mouth daily as needed for pain. 03/25/21   [provider]  PROAIR HFA 108 (90 Base) MCG/ACT inhaler Take 1 puff by mouth every 6 (six) hours as needed for shortness of breath. 10/08/18   [provider]  QUEtiapine (SEROQUEL) 100 MG tablet Take 100 mg by mouth at bedtime.    [provider]    Allergies    Patient has no known allergies.  Review of Systems   Review of Systems  Constitutional: Negative for fever.  Gastrointestinal: Negative for abdominal pain and vomiting.  Skin: Positive for wound.  All other systems reviewed and are negative.   Physical Exam Updated Vital Signs BP (!) 164/82 (BP Location: Right Arm)   Pulse 86   Temp (!) 96.9 F (36.1 C) (Temporal)   Resp 17   SpO2 99%   Physical Exam Vitals and nursing note reviewed.  Constitutional:      General: She is not in acute distress.    Appearance: She is well-developed.  HENT:     Head: Normocephalic and atraumatic.  Eyes:     Conjunctiva/sclera: Conjunctivae normal.  Abdominal:     General: Abdomen is flat. Bowel sounds are normal. There is no distension.     Palpations: Abdomen is soft.     Tenderness: There is no abdominal tenderness.  Musculoskeletal:     Cervical back: Neck supple.  Skin:    General: Skin is warm and dry.     Comments: R sided laproscopic incision site with scant serous drainage, very superficial dehiscience of skin without adipose exposure, no surrounding cellulitic changes  Neurological:     Mental Status: She is alert and oriented to person, place, and time.  Psychiatric:        Judgment: Judgment normal.     ED Results / Procedures / Treatments   Labs (all labs ordered are listed, but only  abnormal results are displayed) Labs Reviewed - No data to display  EKG None  Radiology No results found.  Procedures Procedures   Medications Ordered in ED Medications - No data to display  ED Course  I have reviewed the triage vital signs and the nursing notes.     MDM Rules/Calculators/A&P                          I probed site w/ bandage and it does not track deep, no signs of purulent drainage or cellulitis. Applied clean bandage, discussed wound care and surgery clinic f/u.  Final Clinical Impression(s) / ED Diagnoses Final diagnoses:  Visit for wound check    Rx / DC Orders ED Discharge Orders  None       Evadene Wardrip, Wenda Overland, MD 04/17/21 [phone]

## 2021-04-17 NOTE — ED Triage Notes (Signed)
Pt had cholecystectomy on 4/11.  Requesting bandages on abd to be changed.  States they haven't been changed since she was discharged from hospital.  Denies pain.

## 2021-04-17 NOTE — Discharge Instructions (Signed)
Follow-up with your surgeon in the clinic to continue to monitor your surgical wounds.  Return to the ER if you develop fever, redness, or worsening drainage from any of your surgical wounds.

## 2021-08-04 ENCOUNTER — Other Ambulatory Visit: Payer: Self-pay | Admitting: Nurse Practitioner

## 2021-08-04 DIAGNOSIS — Z1231 Encounter for screening mammogram for malignant neoplasm of breast: Secondary | ICD-10-CM

## 2021-09-25 ENCOUNTER — Ambulatory Visit: Payer: Medicare Other

## 2021-10-12 ENCOUNTER — Ambulatory Visit: Payer: Medicare Other

## 2021-10-19 ENCOUNTER — Other Ambulatory Visit: Payer: Self-pay | Admitting: Nurse Practitioner

## 2021-10-19 DIAGNOSIS — Z1231 Encounter for screening mammogram for malignant neoplasm of breast: Secondary | ICD-10-CM

## 2021-11-27 ENCOUNTER — Ambulatory Visit: Payer: Medicare Other

## 2022-01-13 ENCOUNTER — Emergency Department (HOSPITAL_COMMUNITY): Payer: Medicare Other

## 2022-01-13 ENCOUNTER — Emergency Department (HOSPITAL_COMMUNITY)
Admission: EM | Admit: 2022-01-13 | Discharge: 2022-01-13 | Disposition: A | Discharge status: Home / Self Care | Payer: Medicare Other | Attending: Emergency Medicine | Admitting: Emergency Medicine

## 2022-01-13 ENCOUNTER — Encounter (HOSPITAL_COMMUNITY): Payer: Self-pay | Admitting: Emergency Medicine

## 2022-01-13 DIAGNOSIS — M25561 Pain in right knee: Secondary | ICD-10-CM | POA: Insufficient documentation

## 2022-01-13 DIAGNOSIS — R600 Localized edema: Secondary | ICD-10-CM | POA: Diagnosis not present

## 2022-01-13 DIAGNOSIS — I10 Essential (primary) hypertension: Secondary | ICD-10-CM | POA: Insufficient documentation

## 2022-01-13 DIAGNOSIS — W010XXA Fall on same level from slipping, tripping and stumbling without subsequent striking against object, initial encounter: Secondary | ICD-10-CM | POA: Insufficient documentation

## 2022-01-13 DIAGNOSIS — Z859 Personal history of malignant neoplasm, unspecified: Secondary | ICD-10-CM | POA: Insufficient documentation

## 2022-01-13 DIAGNOSIS — Z79899 Other long term (current) drug therapy: Secondary | ICD-10-CM | POA: Insufficient documentation

## 2022-01-13 DIAGNOSIS — Z21 Asymptomatic human immunodeficiency virus [HIV] infection status: Secondary | ICD-10-CM | POA: Diagnosis not present

## 2022-01-13 MED ORDER — KETOROLAC TROMETHAMINE 15 MG/ML IJ SOLN
15.0000 mg | Freq: Once | INTRAMUSCULAR | Status: AC
  Administered 2022-01-13: 15 mg via INTRAMUSCULAR
  Filled 2022-01-13: qty 1

## 2022-01-13 NOTE — ED Triage Notes (Signed)
Patient reports trip and fall x1 week ago. C/o pain to right knee since that time. Reports using topical creams without relief. Hx arthritis and bursitis.

## 2022-01-13 NOTE — Discharge Instructions (Addendum)
Your right knee x-ray today was without concern for fracture.  It does show significant arthritis.  We did give you a shot of Toradol in the emergency room.  You can take Tylenol for pain management at home with occasional ibuprofen for breakthrough pain.  Your exam today was also reassuring and you were able to walk in the emergency room without too much difficulty.  As discussed I recommend you follow-up with the orthopedic group at [facility] for further management.  If you have worsening symptoms, you develop fever, inability to walk please return to the emergency room.

## 2022-01-13 NOTE — ED Provider Notes (Signed)
[facility] DEPT Provider Note   CSN: [id number] Arrival date & time: 01/13/22  7322     History  Chief Complaint  Patient presents with   Diane Sutton is a 66 y.o. female.  66 year old female presents today for evaluation of right knee pain of about 1 week duration following fall while at church.  Patient reports she was in a prior line at church and she was being prayed upon when "the power of God struck her to her knees".  She denies loss of consciousness, chest pain, shortness of breath, palpitations surrounding the event.  She reports history of osteoarthritis and follows with [facility] and receives joint injections in her right hip most recently 5 weeks ago.  She denies knee pain prior to the fall that occurred on Sunday.  She reports since then she has occasional sensation of giving out in the right knee.  Currently she denies any pain in the right knee while at rest.  Denies fever, chills, falls since the incident.  The history is provided by the patient. No language interpreter was used.      Home Medications Prior to Admission medications   Medication Sig Start Date End Date Taking? Authorizing Provider  cetirizine (ZYRTEC) 10 MG tablet Take 10 mg by mouth daily.    [provider]  hydrochlorothiazide (MICROZIDE) 12.5 MG capsule Take 12.5 mg by mouth daily.  04/04/18   [provider]  ibuprofen (ADVIL) 200 MG tablet Take 400 mg by mouth every 6 (six) hours as needed for mild pain.    [provider]  ipratropium-albuterol (DUONEB) 0.5-2.5 (3) MG/3ML SOLN Take 3 mLs by nebulization every 6 (six) hours as needed. Patient taking differently: Take 3 mLs by nebulization every 6 (six) hours as needed (wheezing). 10/26/18   Raiford Noble Latif, DO  meloxicam (MOBIC) 7.5 MG tablet Take 7.5 mg by mouth daily as needed for pain. 03/25/21   [provider]  PROAIR HFA 108 (90 Base) MCG/ACT inhaler Take 1 puff  by mouth every 6 (six) hours as needed for shortness of breath. 10/08/18   [provider]  QUEtiapine (SEROQUEL) 100 MG tablet Take 100 mg by mouth at bedtime.    [provider]      Allergies    Patient has no known allergies.    Review of Systems   Review of Systems  Constitutional:  Negative for activity change, chills and fever.  Respiratory:  Negative for shortness of breath.   Cardiovascular:  Negative for chest pain, palpitations and leg swelling.  Musculoskeletal:  Positive for gait problem. Negative for joint swelling.  Neurological:  Negative for weakness and headaches.  All other systems reviewed and are negative.  Physical Exam Updated Vital Signs BP (!) 164/92    Pulse 70    Temp 97.8 F (36.6 C)    Resp 18    SpO2 99%  Physical Exam Vitals and nursing note reviewed.  Constitutional:      General: She is not in acute distress.    Appearance: Normal appearance. She is not ill-appearing.  HENT:     Head: Normocephalic and atraumatic.     Nose: Nose normal.  Eyes:     General: No scleral icterus.    Extraocular Movements: Extraocular movements intact.     Conjunctiva/sclera: Conjunctivae normal.  Cardiovascular:     Rate and Rhythm: Normal rate and regular rhythm.     Pulses: Normal pulses.  Pulmonary:  Effort: Pulmonary effort is normal. No respiratory distress.     Breath sounds: Normal breath sounds. No wheezing or rales.  Musculoskeletal:        General: Normal range of motion.     Cervical back: Normal range of motion.     Right lower leg: Edema (Trace pitting edema) present.     Left lower leg: Edema (Trace pitting edema) present.     Comments: Right knee without visual deformity or significant swelling.  Mild tenderness palpation present with deep palpation of right knee.  Full range of motion in bilateral knees including knee extension and flexion.  Mild pain reported with full knee extension.  Without laxity on anterior drawer,  valgus or varus force.  Patient able to ambulate within the room without difficulty.  2+ DP pulses present and symmetrical.  Trace pitting edema up to mid shin and bilateral lower extremities.  Skin:    General: Skin is warm and dry.  Neurological:     General: No focal deficit present.     Mental Status: She is alert. Mental status is at baseline.    ED Results / Procedures / Treatments   Labs (all labs ordered are listed, but only abnormal results are displayed) Labs Reviewed - No data to display  EKG None  Radiology DG Knee Complete 4 Views Right  Result Date: 01/13/2022 CLINICAL DATA:  Fall 1 week ago. Right knee pain. Initial encounter. EXAM: RIGHT KNEE - COMPLETE 4+ VIEW COMPARISON:  03/07/2012 FINDINGS: No evidence of fracture, dislocation, or joint effusion. Tricompartmental osteoarthritis is again seen, with most severe involvement of the medial compartment including subchondral geodes, similar to prior study. Other bone lesions identified. Generalized osteopenia noted. IMPRESSION: No acute findings. Stable tricompartmental osteoarthritis, with most severe involvement of the medial compartment. Electronically Signed   By: Marlaine Hind M.D.   On: 01/13/2022 10:11    Procedures Procedures    Medications Ordered in ED Medications - No data to display  ED Course/ Medical Decision Making/ A&P                           Medical Decision Making  Medical Decision Making / ED Course   This patient presents to the ED for concern of right knee pain of about 1 week duration following fall, this involves an extensive number of treatment options, and is a complaint that carries with it a high risk of complications and morbidity.  The differential diagnosis includes arthritis, septic joint, bursitis.  Septic joint less likely given no fever, warmth, or erythema to the joint.  She is able to bear weight.  MDM: 66 year old female presents today for evaluation of right knee pain  following fall that occurred at church last Sunday.  Patient denies knee pain prior to this fall.  Patient reports she was at church getting paid upon when she fell to her knees.  Denies loss of consciousness or prodromal symptoms.  States the church floor is made out of pavement that is covered with carpet.  She denies fever, chills, falls since the incident.  She has used over-the-counter ointment without relief.  Currently without any pain.  Exam without laxity, and able to ambulate without difficulty.  Right knee x-ray with evidence of significant osteoarthritis, without acute fractures.  Lab Tests: -I ordered, reviewed, and interpreted labs.   The pertinent results include:   Labs Reviewed - No data to display    EKG  EKG  Interpretation  Date/Time:    Ventricular Rate:    PR Interval:    QRS Duration:   QT Interval:    QTC Calculation:   R Axis:     Text Interpretation:           Imaging Studies ordered: I ordered imaging studies including right knee x-ray I independently visualized and interpreted imaging. I agree with the radiologist interpretation   Medicines ordered and prescription drug management: No orders of the defined types were placed in this encounter.   -I have reviewed the patients home medicines and have made adjustments as needed  Reevaluation: After the interventions noted above, I reevaluated the patient and found that they have :improved  Past Medical History:  Diagnosis Date   Cancer ([place])    Depression    gastric ca    HIV (human immunodeficiency virus infection) ([place])    positive-non active   Hypertension    Migraine    Schizophrenia ([place])       Dispostion: Patient is appropriate for discharge.  Received 15 mg Toradol IM in the emergency room.  Symptomatic treatment discussed.  Return precautions discussed.  Discussed reports of follow-up with her orthopedic practice for further management.  She voices understanding and is in agreement  with plan.  Final Clinical Impression(s) / ED Diagnoses Final diagnoses:  Acute pain of right knee    Rx / DC Orders ED Discharge Orders     None         Evlyn Courier, PA-C 01/13/22 1211    Lucrezia Starch, MD 01/14/22 [phone]

## 2022-01-22 ENCOUNTER — Ambulatory Visit: Payer: Medicare Other

## 2022-06-19 ENCOUNTER — Other Ambulatory Visit: Payer: Self-pay | Admitting: Nurse Practitioner

## 2022-06-19 DIAGNOSIS — Z1231 Encounter for screening mammogram for malignant neoplasm of breast: Secondary | ICD-10-CM

## 2022-07-17 ENCOUNTER — Other Ambulatory Visit: Payer: Self-pay | Admitting: Nurse Practitioner

## 2022-07-17 DIAGNOSIS — Z13820 Encounter for screening for osteoporosis: Secondary | ICD-10-CM

## 2022-08-10 ENCOUNTER — Ambulatory Visit
Admission: RE | Admit: 2022-08-10 | Discharge: 2022-08-10 | Disposition: A | Discharge status: Home / Self Care | Payer: Medicare Other | Source: Ambulatory Visit | Attending: Nurse Practitioner | Admitting: Nurse Practitioner

## 2022-08-10 DIAGNOSIS — Z1231 Encounter for screening mammogram for malignant neoplasm of breast: Secondary | ICD-10-CM

## 2022-11-08 ENCOUNTER — Other Ambulatory Visit: Payer: Self-pay

## 2022-11-08 ENCOUNTER — Encounter (HOSPITAL_COMMUNITY): Payer: Self-pay

## 2022-11-08 ENCOUNTER — Emergency Department (HOSPITAL_COMMUNITY)
Admission: EM | Admit: 2022-11-08 | Discharge: 2022-11-08 | Disposition: A | Discharge status: Home / Self Care | Payer: Medicare Other | Attending: Emergency Medicine | Admitting: Emergency Medicine

## 2022-11-08 DIAGNOSIS — K0889 Other specified disorders of teeth and supporting structures: Secondary | ICD-10-CM | POA: Diagnosis present

## 2022-11-08 DIAGNOSIS — I10 Essential (primary) hypertension: Secondary | ICD-10-CM | POA: Diagnosis not present

## 2022-11-08 DIAGNOSIS — K047 Periapical abscess without sinus: Secondary | ICD-10-CM | POA: Diagnosis not present

## 2022-11-08 DIAGNOSIS — Z21 Asymptomatic human immunodeficiency virus [HIV] infection status: Secondary | ICD-10-CM | POA: Insufficient documentation

## 2022-11-08 MED ORDER — AMOXICILLIN-POT CLAVULANATE 875-125 MG PO TABS: 1.0000 | ORAL_TABLET | Freq: Two times a day (BID) | ORAL | 0 refills | Status: AC

## 2022-11-08 NOTE — Discharge Instructions (Addendum)
You were seen in the emergency department today for dental pain.  As we discussed I think you have a dental abscess.  I prescribed you some antibiotics.  I recommend continuing anti-inflammatories at home.  I have attached a list of dentists in the area for you to follow-up with.  Continue to monitor how you are doing and return to the emergency department for any new or worsening symptoms such as tongue swelling, difficulty swallowing, difficulty breathing, occultly opening her mouth, fevers or chills.

## 2022-11-08 NOTE — ED Triage Notes (Signed)
Toothache for 2 weeks.

## 2022-11-08 NOTE — ED Provider Notes (Signed)
[facility] DEPARTMENT Provider Note   CSN: [id number] Arrival date & time: 11/08/22  1325     History  No chief complaint on file.   Diane Sutton is a 66 y.o. female with history of HIV controlled on Sutton, Diane Sutton, Diane Sutton, Diane Sutton, Diane Sutton been taking ibuprofen with good relief.  States that she tried to make an appointment with her dentist but they are currently booked.  No fevers, chills, difficulty opening her mouth or swallowing, difficulty breathing.  HPI     Home Medications Prior to Admission medications   Sutton Sig Start Date End Date Taking? Authorizing Provider  amoxicillin-clavulanate (AUGMENTIN) 875-125 MG tablet Take 1 tablet by mouth every 12 (twelve) hours for 7 days. 11/08/22 11/15/22 Yes Tiegan Jambor T, PA-C  cetirizine (ZYRTEC) 10 MG tablet Take 10 mg by mouth daily.    [provider]  hydrochlorothiazide (MICROZIDE) 12.5 MG capsule Take 12.5 mg by mouth daily.  04/04/18   [provider]  ibuprofen (ADVIL) 200 MG tablet Take 400 mg by mouth every 6 (six) hours as needed for mild pain.    [provider]  ipratropium-albuterol (DUONEB) 0.5-2.5 (3) MG/3ML SOLN Take 3 mLs by nebulization every 6 (six) hours as needed. Patient taking differently: Take 3 mLs by nebulization every 6 (six) hours as needed (wheezing). 10/26/18   Raiford Noble Latif, DO  meloxicam (MOBIC) 7.5 MG tablet Take 7.5 mg by mouth daily as needed for pain. 03/25/21   [provider]  PROAIR HFA 108 (90 Base) MCG/ACT inhaler Take 1 puff by mouth every 6 (six) hours as needed for shortness of breath. 10/08/18   [provider]  QUEtiapine (SEROQUEL) 100 MG tablet Take 100 mg by mouth at bedtime.    [provider]      Allergies     Patient Sutton no known allergies.    Review of Systems   Review of Systems  Constitutional:  Negative for chills Diane fever.  HENT:  Positive for dental problem. Negative for sore throat, trouble swallowing Diane voice change.   Respiratory:  Negative for shortness of breath.   All other systems reviewed Diane are negative.   Physical Exam Updated Vital Signs BP (!) 149/78 (BP Location: Right Arm)   Pulse 87   Temp 98.1 F (36.7 C) (Oral)   Resp 17   SpO2 94%  Physical Exam Vitals Diane nursing note reviewed.  Constitutional:      Appearance: Normal appearance.  HENT:     Head: Normocephalic Diane atraumatic.     Mouth/Throat:     Lips: Pink.     Mouth: Mucous membranes are moist.     Dentition: Dental abscesses present.     Pharynx: Uvula midline.     Tonsils: No tonsillar exudate or tonsillar abscesses.     Comments: Small periapical abscess noted to the right lower molar.  No sublingual or submandibular swelling.  No tonsillar swelling or abscess.  No trismus. Eyes:     Conjunctiva/sclera: Conjunctivae normal.  Pulmonary:     Effort: Pulmonary effort is normal. No respiratory distress.  Skin:    General: Skin is warm Diane dry.  Neurological:     Mental Status: She is alert.  Psychiatric:        Mood Diane Affect: Mood normal.  Behavior: Behavior normal.     ED Results / Procedures / Treatments   Labs (all labs ordered are listed, but only abnormal results are displayed) Labs Reviewed - No data to display  EKG None  Radiology No results found.  Procedures Procedures    Medications Ordered in ED Medications - No data to display  ED Course/ Medical Decision Making/ A&P                           Medical Decision Making Risk Prescription drug management.  This patient is a 66 y.o. female who presents to the ED for concern of dental pain.   Differential diagnoses prior to evaluation: Dental abscess, dental caries, dental fracture, PTA, ludwig's  angina  Past Medical History / Social History / Additional history: Chart reviewed. Pertinent results include: HIV controlled on Sutton, Diane Sutton, Diane Sutton, Diane Sutton, Diane migraines   Physical Exam: Physical exam performed. The pertinent findings include: Normal vital signs, afebrile.  Small periapical abscess to the right lower molar.  No evidence of PTA or ludwig's angina. No trismus.   Disposition: After consideration of the diagnostic results Diane the patients response to treatment, I feel that emergency department workup does not suggest an emergent condition requiring admission or immediate intervention beyond what Sutton been performed at this time. The plan is: discharge to home with antibiotics, encouraged anti-inflammatories, Given dental resource guide. The patient is safe for discharge Diane Sutton been instructed to return immediately for worsening symptoms, change in symptoms or any other concerns.   Final Clinical Impression(s) / ED Diagnoses Final diagnoses:  Dental abscess    Rx / DC Orders ED Discharge Orders          Ordered    amoxicillin-clavulanate (AUGMENTIN) 875-125 MG tablet  Every 12 hours        11/08/22 1420           Portions of this report may have been transcribed using voice recognition software. Every effort was made to ensure accuracy; however, inadvertent computerized transcription errors may be present.    Estill Cotta 11/08/22 1431    Davonna Belling, MD 11/08/22 1622

## 2023-01-29 ENCOUNTER — Inpatient Hospital Stay: Admission: RE | Admit: 2023-01-29 | Payer: Medicare Other | Source: Ambulatory Visit

## 2023-06-07 ENCOUNTER — Emergency Department (HOSPITAL_COMMUNITY): Payer: Medicare Other

## 2023-06-07 ENCOUNTER — Encounter (HOSPITAL_COMMUNITY): Payer: Self-pay

## 2023-06-07 ENCOUNTER — Other Ambulatory Visit: Payer: Self-pay

## 2023-06-07 ENCOUNTER — Emergency Department (HOSPITAL_COMMUNITY)
Admission: EM | Admit: 2023-06-07 | Discharge: 2023-06-07 | Disposition: A | Discharge status: Home / Self Care | Payer: Medicare Other | Attending: Emergency Medicine | Admitting: Emergency Medicine

## 2023-06-07 DIAGNOSIS — Z79899 Other long term (current) drug therapy: Secondary | ICD-10-CM | POA: Diagnosis not present

## 2023-06-07 DIAGNOSIS — Z21 Asymptomatic human immunodeficiency virus [HIV] infection status: Secondary | ICD-10-CM | POA: Diagnosis not present

## 2023-06-07 DIAGNOSIS — I10 Essential (primary) hypertension: Secondary | ICD-10-CM | POA: Diagnosis not present

## 2023-06-07 DIAGNOSIS — M25561 Pain in right knee: Secondary | ICD-10-CM | POA: Diagnosis present

## 2023-06-07 DIAGNOSIS — J449 Chronic obstructive pulmonary disease, unspecified: Secondary | ICD-10-CM | POA: Diagnosis not present

## 2023-06-07 MED ORDER — ACETAMINOPHEN 500 MG PO TABS
1000.0000 mg | ORAL_TABLET | Freq: Once | ORAL | Status: AC
  Administered 2023-06-07: 1000 mg via ORAL
  Filled 2023-06-07: qty 2

## 2023-06-07 NOTE — ED Provider Notes (Signed)
[facility] EMERGENCY DEPARTMENT AT [facility] Provider Note   CSN: [id number] Arrival date & time: 06/07/23  1218     History  Chief Complaint  Patient presents with   Knee Pain   HPI Diane Sutton is a 67 y.o. female with HIV, hypertension and COPD presenting for right knee pain. States -her leg "checked out".  Has been somewhat painful to walk but still able to ambulate and bear weight.  Denies numbness or weakness in the right leg. States that pain today is actually improved she just that 2 days ago she was walking and stepped into a hole with her right leg.  Started to have the knee pain. Took ibuprofen today for pain.  Patient is normally walks with a walking cane.   Knee Pain      Home Medications Prior to Admission medications   Medication Sig Start Date End Date Taking? Authorizing Provider  cetirizine (ZYRTEC) 10 MG tablet Take 10 mg by mouth daily.    [provider]  hydrochlorothiazide (MICROZIDE) 12.5 MG capsule Take 12.5 mg by mouth daily.  04/04/18   [provider]  ibuprofen (ADVIL) 200 MG tablet Take 400 mg by mouth every 6 (six) hours as needed for mild pain.    [provider]  ipratropium-albuterol (DUONEB) 0.5-2.5 (3) MG/3ML SOLN Take 3 mLs by nebulization every 6 (six) hours as needed. Patient taking differently: Take 3 mLs by nebulization every 6 (six) hours as needed (wheezing). 10/26/18   Marguerita Merles Latif, DO  meloxicam (MOBIC) 7.5 MG tablet Take 7.5 mg by mouth daily as needed for pain. 03/25/21   [provider]  PROAIR HFA 108 (90 Base) MCG/ACT inhaler Take 1 puff by mouth every 6 (six) hours as needed for shortness of breath. 10/08/18   [provider]  QUEtiapine (SEROQUEL) 100 MG tablet Take 100 mg by mouth at bedtime.    [provider]      Allergies    Patient has no known allergies.    Review of Systems   See HPI for pertinent positives  Physical Exam Updated Vital  Signs BP (!) 160/82 (BP Location: Left Arm)   Pulse 84   Temp 97.8 F (36.6 C) (Oral)   Resp 18   Ht 5' 6.5" (1.689 m)   Wt 118.8 kg   SpO2 97%   BMI 41.65 kg/m  Physical Exam Constitutional:      Appearance: Normal appearance.  HENT:     Head: Normocephalic.     Nose: Nose normal.  Eyes:     Conjunctiva/sclera: Conjunctivae normal.  Pulmonary:     Effort: Pulmonary effort is normal.  Musculoskeletal:     Right knee: No swelling, deformity or effusion. Normal range of motion. Tenderness present.     Left knee: Normal. No swelling, deformity or effusion. Normal range of motion. No tenderness.     Comments: Able to ambulate and bear weight with the assistance of her cane.  Neurological:     Mental Status: She is alert.  Psychiatric:        Mood and Affect: Mood normal.     ED Results / Procedures / Treatments   Labs (all labs ordered are listed, but only abnormal results are displayed) Labs Reviewed - No data to display  EKG None  Radiology DG Knee Complete 4 Views Right  Result Date: 06/07/2023 CLINICAL DATA:  Fall, right knee pain. EXAM: RIGHT KNEE - COMPLETE 4+ VIEW COMPARISON:  None Available. FINDINGS:  No evidence of fracture, dislocation, or joint effusion. Tricompartmental knee joint space narrowing prominent in the medial tibiofemoral and patellofemoral compartment with near complete loss of the articular cartilage. Prominent marginal osteophytes. Soft tissues are unremarkable. IMPRESSION: 1. No acute fracture or dislocation. 2. Tricompartmental knee osteoarthritis. Electronically Signed   By: Larose Hires D.O.   On: 06/07/2023 13:24    Procedures Procedures    Medications Ordered in ED Medications  acetaminophen (TYLENOL) tablet 1,000 mg (has no administration in time range)    ED Course/ Medical Decision Making/ A&P                             Medical Decision Making Amount and/or Complexity of Data Reviewed Radiology: ordered.   67 year old  well-appearing female presenting for knee pain after a fall 2 days ago.  Exam was unremarkable and revealed grossly normal knee.  X-ray did not reveal acute findings.  Did advise patient of the osteoarthritis discovered on x-ray.  Treat her pain with Tylenol.  Advised to follow-up with her PCP persistent persistent.  Symptoms and clinical findings inconsistent with infection.  As per return precautions.  Vitals remained stable throughout encounter.  Discharged home.        Final Clinical Impression(s) / ED Diagnoses Final diagnoses:  Acute pain of right knee    Rx / DC Orders ED Discharge Orders     None         Gareth Eagle, PA-C 06/07/23 1405    Lorre Nick, MD 06/08/23 2248

## 2023-06-07 NOTE — Discharge Instructions (Addendum)
Evaluation for your right knee pain was overall reassuring.  X-ray was negative for fracture or dislocation.  X-ray did reveal some arthritis in the knee this could be contributing to your symptoms somewhat.  Advised to follow-up with PCP.  If you have worsening swelling, redness, knee becomes hot, worsening pain or any other concerning symptom please return [facility] for further evaluation.

## 2023-06-07 NOTE — ED Triage Notes (Signed)
Patient has had right knee pain for 2 days. Stepped in a hole walking too fast. The next morning painful to walk.

## 2023-07-25 ENCOUNTER — Other Ambulatory Visit: Payer: Self-pay | Admitting: Family

## 2023-07-25 DIAGNOSIS — R1030 Lower abdominal pain, unspecified: Secondary | ICD-10-CM

## 2023-08-06 ENCOUNTER — Ambulatory Visit
Admission: RE | Admit: 2023-08-06 | Discharge: 2023-08-06 | Disposition: A | Discharge status: Home / Self Care | Payer: Medicare Other | Source: Ambulatory Visit | Attending: Family | Admitting: Family

## 2023-08-06 ENCOUNTER — Other Ambulatory Visit: Payer: Self-pay | Admitting: Family

## 2023-08-06 DIAGNOSIS — R1030 Lower abdominal pain, unspecified: Secondary | ICD-10-CM

## 2023-08-06 DIAGNOSIS — M25552 Pain in left hip: Secondary | ICD-10-CM

## 2023-08-14 ENCOUNTER — Encounter (HOSPITAL_COMMUNITY): Payer: Self-pay

## 2023-08-14 ENCOUNTER — Emergency Department (HOSPITAL_COMMUNITY): Payer: Medicare Other

## 2023-08-14 ENCOUNTER — Emergency Department (HOSPITAL_COMMUNITY)
Admission: EM | Admit: 2023-08-14 | Discharge: 2023-08-14 | Disposition: A | Discharge status: Home / Self Care | Payer: Medicare Other | Attending: Emergency Medicine | Admitting: Emergency Medicine

## 2023-08-14 ENCOUNTER — Other Ambulatory Visit: Payer: Self-pay

## 2023-08-14 DIAGNOSIS — U071 COVID-19: Secondary | ICD-10-CM | POA: Insufficient documentation

## 2023-08-14 DIAGNOSIS — R059 Cough, unspecified: Secondary | ICD-10-CM | POA: Diagnosis present

## 2023-08-14 DIAGNOSIS — M25552 Pain in left hip: Secondary | ICD-10-CM | POA: Diagnosis not present

## 2023-08-14 DIAGNOSIS — R1032 Left lower quadrant pain: Secondary | ICD-10-CM | POA: Diagnosis not present

## 2023-08-14 LAB — CBC
HCT: 40.2 % (ref 36.0–46.0)
Hemoglobin: 12.9 g/dL (ref 12.0–15.0)
MCH: 31.9 pg (ref 26.0–34.0)
MCHC: 32.1 g/dL (ref 30.0–36.0)
MCV: 99.3 fL (ref 80.0–100.0)
Platelets: 312 10*3/uL (ref 150–400)
RBC: 4.05 MIL/uL (ref 3.87–5.11)
RDW: 12.7 % (ref 11.5–15.5)
WBC: 8.2 10*3/uL (ref 4.0–10.5)
nRBC: 0 % (ref 0.0–0.2)

## 2023-08-14 LAB — URINALYSIS, ROUTINE W REFLEX MICROSCOPIC
Bilirubin Urine: NEGATIVE
Glucose, UA: NEGATIVE mg/dL
Hgb urine dipstick: NEGATIVE
Ketones, ur: NEGATIVE mg/dL
Leukocytes,Ua: NEGATIVE
Nitrite: NEGATIVE
Protein, ur: NEGATIVE mg/dL
Specific Gravity, Urine: 1.014 (ref 1.005–1.030)
pH: 8 (ref 5.0–8.0)

## 2023-08-14 LAB — COMPREHENSIVE METABOLIC PANEL
ALT: 17 U/L (ref 0–44)
AST: 21 U/L (ref 15–41)
Albumin: 3.6 g/dL (ref 3.5–5.0)
Alkaline Phosphatase: 69 U/L (ref 38–126)
Anion gap: 8 (ref 5–15)
BUN: 16 mg/dL (ref 8–23)
CO2: 26 mmol/L (ref 22–32)
Calcium: 9.2 mg/dL (ref 8.9–10.3)
Chloride: 103 mmol/L (ref 98–111)
Creatinine, Ser: 0.99 mg/dL (ref 0.44–1.00)
GFR, Estimated: >60 mL/min (ref >60)
Glucose, Bld: 95 mg/dL (ref 70–99)
Potassium: 3.5 mmol/L (ref 3.5–5.1)
Sodium: 137 mmol/L (ref 135–145)
Total Bilirubin: 0.3 mg/dL (ref 0.3–1.2)
Total Protein: 7.9 g/dL (ref 6.5–8.1)

## 2023-08-14 LAB — LIPASE, BLOOD: Lipase: 24 U/L (ref 11–51)

## 2023-08-14 LAB — LACTIC ACID, PLASMA: Lactic Acid, Venous: 0.9 mmol/L (ref 0.5–1.9)

## 2023-08-14 LAB — RESP PANEL BY RT-PCR (RSV, FLU A&B, COVID)  RVPGX2
Influenza A by PCR: NEGATIVE
Influenza B by PCR: NEGATIVE
Resp Syncytial Virus by PCR: NEGATIVE
SARS Coronavirus 2 by RT PCR: POSITIVE — AB

## 2023-08-14 MED ORDER — SODIUM CHLORIDE 0.9 % IV BOLUS
1000.0000 mL | Freq: Once | INTRAVENOUS | Status: AC
  Administered 2023-08-14: 1000 mL via INTRAVENOUS

## 2023-08-14 MED ORDER — IOHEXOL 350 MG/ML SOLN
75.0000 mL | Freq: Once | INTRAVENOUS | Status: AC | PRN
  Administered 2023-08-14: 75 mL via INTRAVENOUS

## 2023-08-14 MED ORDER — ACETAMINOPHEN 325 MG PO TABS
650.0000 mg | ORAL_TABLET | Freq: Once | ORAL | Status: AC
  Administered 2023-08-14: 650 mg via ORAL
  Filled 2023-08-14: qty 2

## 2023-08-14 MED ORDER — ACETAMINOPHEN 325 MG PO TABS: 650.0000 mg | ORAL_TABLET | Freq: Once | ORAL | Status: DC | PRN

## 2023-08-14 NOTE — ED Provider Notes (Signed)
[place] EMERGENCY DEPARTMENT AT [facility] Provider Note   CSN: [id number] Arrival date & time: 08/14/23  1417     History {Add pertinent medical, surgical, social history, OB history to HPI:1} Chief Complaint  Patient presents with   Groin Pain    Diane Sutton is a 67 y.o. female.  She is presenting with a complaint of left lower quadrant pain left pelvis and hip pain that is been going on for a few months.  She said it started with a fall.  She has been taking some ibuprofen which helps intermittently.  She also thinks she has a cold for the last few days, runny nose and cough.  Fever.  She denies any chest pain vomiting diarrhea or urinary symptoms.  She has a history of HIV but she said she has not taken medicines for it because she is all better from that.  The history is provided by the patient.  Groin Pain This is a new problem. Episode onset: 2 months. The problem has not changed since onset.Associated symptoms include abdominal pain. Pertinent negatives include no chest pain, no headaches and no shortness of breath. Nothing aggravates the symptoms. Nothing relieves the symptoms. Treatments tried: ibuprofen. The treatment provided moderate relief.       Home Medications Prior to Admission medications   Medication Sig Start Date End Date Taking? Authorizing Provider  cetirizine (ZYRTEC) 10 MG tablet Take 10 mg by mouth daily.    [provider]  hydrochlorothiazide (MICROZIDE) 12.5 MG capsule Take 12.5 mg by mouth daily.  04/04/18   [provider]  ibuprofen (ADVIL) 200 MG tablet Take 400 mg by mouth every 6 (six) hours as needed for mild pain.    [provider]  ipratropium-albuterol (DUONEB) 0.5-2.5 (3) MG/3ML SOLN Take 3 mLs by nebulization every 6 (six) hours as needed. Patient taking differently: Take 3 mLs by nebulization every 6 (six) hours as needed (wheezing). 10/26/18   Marguerita Merles Latif, DO  meloxicam (MOBIC) 7.5 MG  tablet Take 7.5 mg by mouth daily as needed for pain. 03/25/21   [provider]  PROAIR HFA 108 (90 Base) MCG/ACT inhaler Take 1 puff by mouth every 6 (six) hours as needed for shortness of breath. 10/08/18   [provider]  QUEtiapine (SEROQUEL) 100 MG tablet Take 100 mg by mouth at bedtime.    [provider]      Allergies    Patient has no known allergies.    Review of Systems   Review of Systems  Constitutional:  Positive for fever.  HENT:  Positive for congestion.   Eyes:  Negative for visual disturbance.  Respiratory:  Positive for cough. Negative for shortness of breath.   Cardiovascular:  Negative for chest pain.  Gastrointestinal:  Positive for abdominal pain. Negative for diarrhea, nausea and vomiting.  Genitourinary:  Negative for dysuria.  Musculoskeletal:  Positive for gait problem.  Skin:  Negative for wound.  Neurological:  Negative for headaches.    Physical Exam Updated Vital Signs BP (!) 153/84   Pulse (!) 103   Temp (!) 102 F (38.9 C)   Resp (!) 26   Ht 5' 6.5" (1.689 m)   Wt 118.8 kg   SpO2 97%   BMI 41.65 kg/m  Physical Exam Vitals and nursing note reviewed.  Constitutional:      General: She is not in acute distress.    Appearance: Normal appearance. She is well-developed.  HENT:  Head: Normocephalic and atraumatic.     Nose: Congestion present.     Mouth/Throat:     Mouth: Mucous membranes are moist.  Eyes:     Conjunctiva/sclera: Conjunctivae normal.  Cardiovascular:     Rate and Rhythm: Regular rhythm. Tachycardia present.     Heart sounds: No murmur heard. Pulmonary:     Effort: Pulmonary effort is normal. No respiratory distress.     Breath sounds: Normal breath sounds.  Abdominal:     Palpations: Abdomen is soft.     Tenderness: There is no abdominal tenderness. There is no guarding or rebound.  Musculoskeletal:        General: No tenderness or deformity. Normal range of motion.     Cervical back:  Neck supple.     Right lower leg: No edema.     Left lower leg: No edema.  Skin:    General: Skin is warm and dry.     Capillary Refill: Capillary refill takes less than 2 seconds.  Neurological:     General: No focal deficit present.     Mental Status: She is alert.     Sensory: No sensory deficit.     Motor: No weakness.     ED Results / Procedures / Treatments   Labs (all labs ordered are listed, but only abnormal results are displayed) Labs Reviewed  RESP PANEL BY RT-PCR (RSV, FLU A&B, COVID)  [id number]  CULTURE, BLOOD (ROUTINE X 2)  CULTURE, BLOOD (ROUTINE X 2)  LIPASE, BLOOD  COMPREHENSIVE METABOLIC PANEL  CBC  URINALYSIS, ROUTINE W REFLEX MICROSCOPIC  LACTIC ACID, PLASMA  LACTIC ACID, PLASMA    EKG None  Radiology No results found.  Procedures Procedures  {Document cardiac monitor, telemetry assessment procedure when appropriate:1}  Medications Ordered in ED Medications  acetaminophen (TYLENOL) tablet 650 mg (has no administration in time range)  acetaminophen (TYLENOL) tablet 650 mg (650 mg Oral Given 08/14/23 1600)    ED Course/ Medical Decision Making/ A&P   {   Click here for ABCD2, HEART and other calculatorsREFRESH Note before signing :1}                              Medical Decision Making Amount and/or Complexity of Data Reviewed Labs: ordered. Radiology: ordered.  Risk OTC drugs.   This patient complains of ***; this involves an extensive number of treatment Options and is a complaint that carries with it a high risk of complications and morbidity. The differential includes ***  I ordered, reviewed and interpreted labs, which included *** I ordered medication *** and reviewed PMP when indicated. I ordered imaging studies which included *** and I independently    visualized and interpreted imaging which showed *** Additional history obtained from *** Previous records obtained and reviewed *** I consulted *** and discussed lab and imaging  findings and discussed disposition.  Cardiac monitoring reviewed, *** Social determinants considered, *** Critical Interventions: ***  After the interventions stated above, I reevaluated the patient and found *** Admission and further testing considered, ***   {Document critical care time when appropriate:1} {Document review of labs and clinical decision tools ie heart score, Chads2Vasc2 etc:1}  {Document your independent review of radiology images, and any outside records:1} {Document your discussion with family members, caretakers, and with consultants:1} {Document social determinants of health affecting pt's care:1} {Document your decision making why or why not admission, treatments were needed:1} Final Clinical Impression(s) / ED  Diagnoses Final diagnoses:  None    Rx / DC Orders ED Discharge Orders     None

## 2023-08-14 NOTE — Discharge Instructions (Signed)
You were seen in the emergency department for continued left hip groin pain after a fall.  You had x-rays along with a CAT scan of your abdomen and pelvis that did not show any obvious explanation for your symptoms.  You had a fever here and you ended up testing positive for COVID.  Please keep well-hydrated and rest.  Tylenol for fever.  Follow-up with your primary care doctor.  Return to the emergency department if any worsening or concerning symptoms.

## 2023-08-14 NOTE — ED Triage Notes (Addendum)
Pt reports LLQ abd pain/left groin pain x one month, states the pain is worse when she walks. She denies any other symptoms. She is febrile at triage 102.F  During triage, the patient states she has HIV but has not taken any of her medications, was told she does not need to take them anymore. She is also coughing.

## 2023-08-19 LAB — CULTURE, BLOOD (ROUTINE X 2): Culture: NO GROWTH

## 2023-10-03 ENCOUNTER — Other Ambulatory Visit: Payer: Self-pay | Admitting: Family

## 2023-10-03 DIAGNOSIS — E2839 Other primary ovarian failure: Secondary | ICD-10-CM

## 2023-12-04 ENCOUNTER — Other Ambulatory Visit: Payer: Self-pay | Admitting: Family

## 2023-12-04 DIAGNOSIS — Z1231 Encounter for screening mammogram for malignant neoplasm of breast: Secondary | ICD-10-CM

## 2024-01-03 ENCOUNTER — Ambulatory Visit: Payer: Medicare Other

## 2024-01-14 ENCOUNTER — Ambulatory Visit: Payer: Medicare Other

## 2024-01-31 ENCOUNTER — Ambulatory Visit: Payer: Medicare Other

## 2024-02-05 ENCOUNTER — Ambulatory Visit: Payer: Medicare Other

## 2024-05-07 ENCOUNTER — Other Ambulatory Visit: Payer: Self-pay | Admitting: Family

## 2024-05-07 DIAGNOSIS — E038 Other specified hypothyroidism: Secondary | ICD-10-CM

## 2024-05-07 DIAGNOSIS — R7989 Other specified abnormal findings of blood chemistry: Secondary | ICD-10-CM

## 2024-09-10 ENCOUNTER — Other Ambulatory Visit: Payer: Self-pay | Admitting: Family

## 2024-09-10 DIAGNOSIS — E2839 Other primary ovarian failure: Secondary | ICD-10-CM

## 2025-02-01 ENCOUNTER — Encounter: Payer: Self-pay | Admitting: Gastroenterology

## 2025-02-04 ENCOUNTER — Ambulatory Visit: Admitting: "Endocrinology

## 2025-02-04 ENCOUNTER — Telehealth: Payer: Self-pay

## 2025-02-04 NOTE — Telephone Encounter (Signed)
 Attempted to reach patient concerning colonoscopy recall; unable to speak with patient;  unable to leave a message as phone disconnects as soon as recording plays; will attempt to reach patient at a later date/time to schedule recall colon;
